# Patient Record
Sex: Female | Born: 1975 | Race: White | Hispanic: No | Marital: Married | Smoking: Never smoker
Health system: Southern US, Community
[De-identification: ages and names within clinical notes are randomized; demographics above are authoritative.]

## PROBLEM LIST (undated history)

## (undated) DIAGNOSIS — R001 Bradycardia, unspecified: Secondary | ICD-10-CM

## (undated) DIAGNOSIS — R5383 Other fatigue: Secondary | ICD-10-CM

## (undated) DIAGNOSIS — K602 Anal fissure, unspecified: Secondary | ICD-10-CM

## (undated) DIAGNOSIS — I4719 Other supraventricular tachycardia: Secondary | ICD-10-CM

## (undated) DIAGNOSIS — R0683 Snoring: Secondary | ICD-10-CM

## (undated) DIAGNOSIS — K648 Other hemorrhoids: Secondary | ICD-10-CM

## (undated) DIAGNOSIS — K5900 Constipation, unspecified: Secondary | ICD-10-CM

## (undated) DIAGNOSIS — E0500 Thyrotoxicosis with diffuse goiter without thyrotoxic crisis or storm: Secondary | ICD-10-CM

## (undated) DIAGNOSIS — K635 Polyp of colon: Secondary | ICD-10-CM

## (undated) DIAGNOSIS — R079 Chest pain, unspecified: Secondary | ICD-10-CM

## (undated) DIAGNOSIS — I8390 Asymptomatic varicose veins of unspecified lower extremity: Secondary | ICD-10-CM

## (undated) DIAGNOSIS — R0789 Other chest pain: Secondary | ICD-10-CM

## (undated) DIAGNOSIS — L409 Psoriasis, unspecified: Secondary | ICD-10-CM

## (undated) HISTORY — DX: Thyrotoxicosis with diffuse goiter without thyrotoxic crisis or storm: E05.00

## (undated) HISTORY — DX: Snoring: R06.83

## (undated) HISTORY — DX: Other fatigue: R53.83

## (undated) HISTORY — DX: Chest pain, unspecified: R07.9

## (undated) HISTORY — DX: Psoriasis, unspecified: L40.9

## (undated) HISTORY — PX: MOLE REMOVAL: SHX2046

## (undated) HISTORY — DX: Other chest pain: R07.89

## (undated) HISTORY — DX: Polyp of colon: K63.5

## (undated) HISTORY — PX: TUBAL LIGATION: SHX77

## (undated) HISTORY — DX: Bradycardia, unspecified: R00.1

## (undated) HISTORY — DX: Constipation, unspecified: K59.00

## (undated) HISTORY — DX: Anal fissure, unspecified: K60.2

## (undated) HISTORY — DX: Other supraventricular tachycardia: I47.19

## (undated) HISTORY — PX: WISDOM TOOTH EXTRACTION: SHX21

## (undated) HISTORY — DX: Other hemorrhoids: K64.8

## (undated) HISTORY — DX: Asymptomatic varicose veins of unspecified lower extremity: I83.90

---

## 2004-05-10 ENCOUNTER — Ambulatory Visit: Payer: Self-pay | Admitting: Internal Medicine

## 2004-05-12 ENCOUNTER — Ambulatory Visit: Payer: Self-pay | Admitting: Internal Medicine

## 2005-01-23 ENCOUNTER — Ambulatory Visit: Payer: Self-pay | Admitting: Internal Medicine

## 2005-03-26 ENCOUNTER — Other Ambulatory Visit: Admission: RE | Admit: 2005-03-26 | Discharge: 2005-03-26 | Payer: Self-pay | Admitting: Obstetrics and Gynecology

## 2006-03-22 ENCOUNTER — Ambulatory Visit: Payer: Self-pay | Admitting: Internal Medicine

## 2006-10-04 ENCOUNTER — Ambulatory Visit: Payer: Self-pay | Admitting: Internal Medicine

## 2007-01-01 DIAGNOSIS — L408 Other psoriasis: Secondary | ICD-10-CM

## 2007-05-02 ENCOUNTER — Inpatient Hospital Stay (HOSPITAL_COMMUNITY): Admission: AD | Admit: 2007-05-02 | Discharge: 2007-05-06 | Payer: Self-pay | Admitting: Obstetrics and Gynecology

## 2007-05-03 ENCOUNTER — Encounter (INDEPENDENT_AMBULATORY_CARE_PROVIDER_SITE_OTHER): Payer: Self-pay | Admitting: Obstetrics and Gynecology

## 2007-05-07 ENCOUNTER — Encounter (HOSPITAL_COMMUNITY): Admission: RE | Admit: 2007-05-07 | Discharge: 2007-05-14 | Payer: Self-pay | Admitting: Obstetrics and Gynecology

## 2007-07-23 ENCOUNTER — Ambulatory Visit: Payer: Self-pay | Admitting: Internal Medicine

## 2007-07-23 DIAGNOSIS — K602 Anal fissure, unspecified: Secondary | ICD-10-CM | POA: Insufficient documentation

## 2008-02-25 ENCOUNTER — Telehealth: Payer: Self-pay | Admitting: Internal Medicine

## 2009-05-21 LAB — CONVERTED CEMR LAB

## 2009-10-21 ENCOUNTER — Ambulatory Visit: Payer: Self-pay | Admitting: Internal Medicine

## 2009-10-24 LAB — CONVERTED CEMR LAB
Albumin: 4.7 g/dL (ref 3.5–5.2)
Basophils Absolute: 0 10*3/uL (ref 0.0–0.1)
Basophils Relative: 0.4 % (ref 0.0–3.0)
Cholesterol: 130 mg/dL (ref 0–200)
Creatinine, Ser: 0.6 mg/dL (ref 0.4–1.2)
Eosinophils Absolute: 0.1 10*3/uL (ref 0.0–0.7)
Eosinophils Relative: 1.1 % (ref 0.0–5.0)
GFR calc non Af Amer: 125.45 mL/min (ref >60)
HCT: 39.5 % (ref 36.0–46.0)
LDL Cholesterol: 60 mg/dL (ref 0–99)
Lymphs Abs: 2.1 10*3/uL (ref 0.7–4.0)
MCHC: 34.5 g/dL (ref 30.0–36.0)
MCV: 90.6 fL (ref 78.0–100.0)
Monocytes Absolute: 0.4 10*3/uL (ref 0.1–1.0)
Monocytes Relative: 7.2 % (ref 3.0–12.0)
Neutro Abs: 3.6 10*3/uL (ref 1.4–7.7)
Sodium: 142 meq/L (ref 135–145)
Total CHOL/HDL Ratio: 2
Total Protein: 7.3 g/dL (ref 6.0–8.3)
WBC: 6.2 10*3/uL (ref 4.5–10.5)

## 2009-10-25 ENCOUNTER — Encounter (INDEPENDENT_AMBULATORY_CARE_PROVIDER_SITE_OTHER): Payer: Self-pay | Admitting: *Deleted

## 2009-12-14 ENCOUNTER — Ambulatory Visit: Payer: Self-pay | Admitting: Gastroenterology

## 2009-12-15 ENCOUNTER — Telehealth: Payer: Self-pay | Admitting: Gastroenterology

## 2009-12-26 ENCOUNTER — Inpatient Hospital Stay (HOSPITAL_COMMUNITY): Admission: AD | Admit: 2009-12-26 | Discharge: 2009-12-27 | Payer: Self-pay | Admitting: Obstetrics and Gynecology

## 2009-12-26 ENCOUNTER — Ambulatory Visit: Payer: Self-pay | Admitting: Family

## 2010-03-08 ENCOUNTER — Inpatient Hospital Stay (HOSPITAL_COMMUNITY): Admission: AD | Admit: 2010-03-08 | Discharge: 2010-03-08 | Payer: Self-pay | Admitting: Obstetrics and Gynecology

## 2010-05-10 ENCOUNTER — Encounter
Admission: RE | Admit: 2010-05-10 | Discharge: 2010-05-21 | Payer: Self-pay | Source: Home / Self Care | Attending: Obstetrics and Gynecology | Admitting: Obstetrics and Gynecology

## 2010-06-16 ENCOUNTER — Inpatient Hospital Stay (HOSPITAL_COMMUNITY)
Admission: AD | Admit: 2010-06-16 | Discharge: 2010-06-19 | Payer: Self-pay | Source: Home / Self Care | Attending: Obstetrics and Gynecology | Admitting: Obstetrics and Gynecology

## 2010-06-16 LAB — CBC
HCT: 37.4 % (ref 36.0–46.0)
Hemoglobin: 13 g/dL (ref 12.0–15.0)
MCHC: 34.8 g/dL (ref 30.0–36.0)
MCV: 90.1 fL (ref 78.0–100.0)
RDW: 12.8 % (ref 11.5–15.5)

## 2010-06-16 LAB — GLUCOSE, CAPILLARY
Glucose-Capillary: 79 mg/dL (ref 70–99)
Glucose-Capillary: 121 mg/dL — ABNORMAL HIGH (ref 70–99)

## 2010-06-17 LAB — CBC
HCT: 32 % — ABNORMAL LOW (ref 36.0–46.0)
MCHC: 33.8 g/dL (ref 30.0–36.0)

## 2010-06-20 NOTE — Assessment & Plan Note (Signed)
Summary: rectal bleeding--ch.   History of Present Illness Visit Type: Initial Consult Primary GI MD: Elie Goody MD [facility] Primary Provider: Birdie Sons, MD Requesting Provider: Birdie Sons, MD Chief Complaint: Patient c/o occasional brbpr since her early 20's. She states that she does occasionally have rectal pain. She denies any problems with constipation/diarrhea. No abdominal pain. Of Note: Patient is [redacted] weeks pregnant. History of Present Illness:   This is a 35 year old white female, who relates a 10 year history of intermittent rectal bleeding and rectal pain. Dr. Cato Mulligan diagnosed an anal fissure in 2009, and she was temporarily treated with nitroglycerin, but noted side effects and she discontinued it. She has recently been using a corticosteroid cream as needed which only gives her temporary relief of symptoms.  She has mild constipation, but no other gastrointestinal complaints. She is [redacted] weeks pregnant with her second child.    GI Review of Systems      Denies abdominal pain, acid reflux, belching, bloating, chest pain, dysphagia with liquids, dysphagia with solids, heartburn, loss of appetite, nausea, vomiting, vomiting blood, weight loss, and  weight gain.      Reports constipation, rectal bleeding, and  rectal pain.     Denies anal fissure, black tarry stools, change in bowel habit, diarrhea, diverticulosis, fecal incontinence, heme positive stool, hemorrhoids, irritable bowel syndrome, jaundice, light color stool, and  liver problems. Preventive Screening-Counseling & Management  Alcohol-Tobacco     Smoking Status: never  Caffeine-Diet-Exercise     Does Patient Exercise: no      Drug Use:  no.     Current Medications (verified): 1)  Clobex Spray 0.05 % Liqd (Clobetasol Propionate) .... Two Times A Day To Scalp 2)  Bio-Statin  Powd (Nystatin) .... Prn 3)  Triamcinolone Acetonide 0.1 % Crea (Triamcinolone Acetonide) .... As Needed 4)  Desonide 0.05 % Oint  (Desonide) .... Prn 5)  Prenatal Vitamins 0.8 Mg Tabs (Prenatal Multivit-Min-Fe-Fa) .... Take 1 Tablet By Mouth Once Daily 6)  Colace (Unknown Dosage) .... Take 1 Tabelt By Mouth Once Daily 7)  Metamucil 0.52 Gm Caps (Psyllium) .... Take 2 Capsules By Mouth Once Daily  Allergies (verified): No Known Drug Allergies  Past History:  Past Medical History: psoriasis anal fissure  Past Surgical History: mole removals c-section x 1  Family History: mother-? 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  Alcohol Use - no Illicit Drug Use - no Patient does not get regular exercise.  Drug Use:  no Does Patient Exercise:  no  Review of Systems       The patient complains of pregnancy symptoms.  The patient denies allergy/sinus, anemia, anxiety-new, arthritis/joint pain, back pain, blood in urine, breast changes/lumps, change in vision, confusion, cough, coughing up blood, depression-new, fainting, fatigue, fever, headaches-new, hearing problems, heart murmur, heart rhythm changes, itching, menstrual pain, muscle pains/cramps, night sweats, nosebleeds, shortness of breath, skin rash, sleeping problems, sore throat, swelling of feet/legs, swollen lymph glands, thirst - excessive , urination - excessive , urination changes/pain, urine leakage, vision changes, and voice change.    Vital Signs:  Patient profile:   35 year old female Menstrual status:  regular Height:      65.5 inches Weight:      152.38 pounds BMI:     25.06 BSA:  1.77 Pulse rate:   56 / minute Pulse rhythm:   regular BP sitting:   100 / 58  (left arm)  Vitals Entered By: Lamona Curl CMA Duncan Dull) (December 14, 2009 3:53 PM)  Physical Exam  General:  Well developed,  well nourished, no acute distress. Head:  Normocephalic and atraumatic. Eyes:  PERRLA, no icterus. Ears:  Normal auditory acuity. Mouth:  No deformity or lesions, dentition normal. Lungs:  Clear throughout to auscultation. Heart:  Regular rate and rhythm; no murmurs, rubs,  or bruits. Abdomen:  Soft, nontender and nondistended. No masses, hepatosplenomegaly or hernias noted. Normal bowel sounds. Rectal:  Subtle linear erythema in the  posterior anal canal, possibly a healing fissure, which is mildly tender. Anal canal is nontender. Hemoccult-Negative yellow brown stool in the vault. No other abnormalities noted. Extremities:  No clubbing, cyanosis, edema or deformities noted. Neurologic:  Alert and  oriented x4;  grossly normal neurologically. Psych:  Alert and cooperative. Normal mood and affect.  Impression & Recommendations:  Problem # 1:  ANAL FISSURE (ICD-565.0) Suspected healing anal fissure. Begin diltiazem 2% cream t.i.d. for 8 weeks, if cleared by her obstetrician. Analpram-HC cream t.i.d. p.r.n., if cleared by her obstetrician. Ideally she should undergo a flexible sigmoidoscopy or colonoscopy for further evaluation, however, since she is pregnant will defer this exam until after her pregnancy. Continue stool softeners and fiber supplements, along with adequate daily for water intake.  Patient Instructions: 1)  We will contact you after we recieve confirmation from your OBGYN  that it is safe for [place] to prescribe Diltazem cream and Analpram for your rectal pain while pregnant.  2)  Analpram cream kit has been sent to your pharmacy.  3)  Please schedule a follow-up appointment in 2 months.  4)  Copy sent to : Birdie Sons, MD 5)                        Huel Cote, MD 6)  The medication list was reviewed and reconciled.  All changed / newly prescribed medications were explained.  A complete medication list was provided to the patient / caregiver.  Prescriptions: ANALPRAM  ADVANCED 2.5-1 % KIT (HC-PRAMOX-DIET MNG PROD -WIPE) use rectally three times a day as needed  #14 x 1   Entered by:   Christie Nottingham CMA (AAMA)   Authorized by:   Meryl Dare MD [facility]   Signed by:   Meryl Dare MD [facility] on 12/14/2009   Method used:   Electronically to        [facility] [address] (retail)       [phone]       [place], [place]  [address]       Ph: [phone]       Fax: [phone]   RxID:   [id number]

## 2010-06-20 NOTE — Progress Notes (Signed)
Summary: Diltiazem cream clearance  Phone Note Outgoing Call Call back at Dr. Huel Cote   Call placed by: Christie Nottingham CMA Duncan Dull),  December 15, 2009 8:53 AM Call placed to: [facility] Summary of Call: Left a message at Dr. Berenda Morale office for Tamara [name] to return my call regarding pt's clearance for [place] to prescribe Diltiazem 2% cream for rectal pain b/c pt is [redacted] weeks pregnant.  Initial call taken by: Christie Nottingham CMA Duncan Dull),  December 15, 2009 8:56 AM  Follow-up for Phone Call        Kriste Basque returned my call and states she will have to ask Dr. Senaida Ores and will call me back.  Follow-up by: Christie Nottingham CMA Duncan Dull),  December 15, 2009 9:00 AM  Additional Follow-up for Phone Call Additional follow up Details #1::        Tamara [name] called back and states per Dr. Senaida Ores that pt can use Diltiazem cream. Pt notified and Rx was sent to pts pharmacy.  Additional Follow-up by: Christie Nottingham CMA Duncan Dull),  December 16, 2009 9:24 AM    New/Updated Medications: * DILTIAZEM 2% CREAM use rectally three times a day x 8 weeks Prescriptions: DILTIAZEM 2% CREAM use rectally three times a day x 8 weeks  #1 tube x 1   Entered by:   Christie Nottingham CMA (AAMA)   Authorized by:   Meryl Dare MD [facility]   Signed by:   Christie Nottingham CMA (AAMA) on 12/16/2009   Method used:   Faxed to ...       [facility]* (retail)       [address]       [place], [place]  [id number]       Ph: [phone]       Fax: [phone]   RxID:   [id number] [facility] ADVANCED 2.5-1 % KIT (HC-PRAMOX-DIET MNG PROD -WIPE) use rectally three times a day as needed  #14 x 1   Entered by:   Christie Nottingham CMA (AAMA)   Authorized by:   Meryl Dare MD [facility]   Signed by:   Christie Nottingham CMA (AAMA) on 12/16/2009   Method used:   Electronically to        [facility]* (retail)       [address]       [place], [place]  [id number]       Ph: [phone]       Fax: [phone]   RxID:    [id number]

## 2010-06-20 NOTE — Assessment & Plan Note (Signed)
Summary: cpx/no pap/pt will come in fasting/njr/pt rescd//ccm/pt rescd...   Vital Signs:  Patient profile:   35 year old female Menstrual status:  regular LMP:     10/11/2009 Weight:      146 pounds Temp:     98.1 degrees F oral BP sitting:   110 / 80  (left arm)  Vitals Entered By: Kathrynn Speed CMA (October 21, 2009 10:05 AM) CC: CPX /Rectum bleeding when has a bowl movement LMP (date): 10/11/2009     Menstrual Status regular Enter LMP: 10/11/2009 Last PAP Result normalpt's report   CC:  CPX /Rectum bleeding when has a bowl movement.  History of Present Illness: Feels well has had recurrent BRBPR--nervous about colon CA  All other systems reviewed and were negative   Current Problems (verified): 1)  Rectal Fissure  (ICD-565.0) 2)  Psoriasis  (ICD-696.1)  Current Medications (verified): 1)  Clobex 0.05 %  Lotn (Clobetasol Propionate) .... Once Daily For Psoriasis 2)  Bio-Statin  Powd (Nystatin) .... Prn 3)  Triamcinolone Acetonide 0.1 % Crea (Triamcinolone Acetonide) .... As Needed 4)  Desonide 0.05 % Oint (Desonide) .... Prn  Allergies (verified): No Known Drug Allergies  Past History:  Past Medical History: Last updated: 01/01/2007 psoriasis  Past Surgical History: Last updated: 01/01/2007 mole removals  Family History: Last updated: 10/21/2009 mother-? 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   Eyes:  No  corneal or conjunctival inflammation noted. EOMI. Perrla. Funduscopic exam benign, without hemorrhages, exudates or papilledema. Vision grossly normal. Ears:  R ear normal and L ear normal.   Neck:  No deformities, masses, or tenderness noted. Chest Wall:  no deformities and no tenderness.   Lungs:  normal respiratory effort and no intercostal retractions.   Heart:  normal rate and regular rhythm.   Abdomen:  Bowel sounds positive,abdomen soft and non-tender without masses, organomegaly or hernias noted. Msk:  No deformity or scoliosis noted of thoracic or lumbar spine.   Pulses:  R radial normal and L radial normal.   Neurologic:  cranial nerves II-XII intact and gait normal.     Impression & Recommendations:  Problem # 1:  Preventive Health Care (ICD-V70.0)  health maint UTD  Orders: UA Dipstick w/o Micro (automated)  (81003) Venipuncture ([address]) TLB-Lipid Panel (80061-LIPID) TLB-BMP (Basic Metabolic Panel-BMET) (80048-METABOL) TLB-CBC Platelet - w/Differential (85025-CBCD) TLB-Hepatic/Liver Function Pnl (80076-HEPATIC) TLB-TSH (Thyroid Stimulating Hormone) (84443-TSH)  Complete Medication List: 1)  Clobex Spray 0.05 % Liqd (Clobetasol propionate) .... Two times a day to scalp 2)  Bio-statin Powd (Nystatin) .... Prn 3)  Triamcinolone Acetonide 0.1 % Crea (Triamcinolone acetonide) .... As needed 4)  Desonide 0.05 % Oint (Desonide) .... Prn  Other Orders: Gastroenterology Referral (GI)  Preventive Care Screening  Pap Smear:    Date:  05/21/2009    Next Due:  05/2012    Results:  normalpt's report   Prescriptions: TRIAMCINOLONE ACETONIDE 0.1 % CREA (TRIAMCINOLONE ACETONIDE) as needed  #30 grams x PRN   Entered and Authorized by:   Birdie Sons MD   Signed by:   Birdie Sons MD on 10/21/2009  Method used:   Electronically to        [facility].* (retail)       [phone]       [place], [place]  [address]       Ph:  [phone]       Fax: [phone]   RxID:   [id number] CLOBEX SPRAY 0.05 % LIQD (CLOBETASOL PROPIONATE) two times a day to scalp  #1bottle x PRN   Entered and Authorized by:   Birdie Sons MD   Signed by:   Birdie Sons MD on 10/21/2009   Method used:   Electronically to        [facility] [address] (retail)       [phone]       [place], [place]  [address]       Ph: [phone]       Fax: [phone]   RxID:   [id number]   Appended Document: cpx/no pap/pt will come in fasting/njr/pt rescd//ccm/pt rescd...  Laboratory Results   Urine Tests    Routine Urinalysis   Color: yellow Appearance: Clear Glucose: negative   (Normal Range: Negative) Bilirubin: negative   (Normal Range: Negative) Ketone: negative   (Normal Range: Negative) Spec. Gravity: 1.025   (Normal Range: 1.003-1.035) Blood: negative   (Normal Range: Negative) pH: 6.5   (Normal Range: 5.0-8.0) Protein: negative   (Normal Range: Negative) Urobilinogen: 0.2   (Normal Range: 0-1) Nitrite: negative   (Normal Range: Negative) Leukocyte Esterace: negative   (Normal Range: Negative)    Comments: Rita Ohara  October 21, 2009 11:23 AM

## 2010-06-20 NOTE — Letter (Signed)
Summary: New Patient letter  [facility]  [address] [place], [place] [address]   Phone: [phone]  Fax: [phone]       10/25/2009 MRN: [id number]  [facility] [address] [place], [place]  [address]  Dear Ms. Tamara [name],  Welcome to the Gastroenterology Division at [facility].    You are scheduled to see Dr.  Russella Dar on 11-30-09 at 11:00a.m. on the 3rd floor at [facility], [address]  We ask that you try to arrive at our office 15 minutes prior to your appointment time to allow for check-in.  We would like you to complete the enclosed self-administered evaluation form prior to your visit and bring it with you on the day of your appointment.  We will review it with you.  Also, please bring a complete list of all your medications or, if you prefer, bring the medication bottles and we will list them.  Please bring your insurance card so that we may make a copy of it.  If your insurance requires a referral to see a specialist, please bring your referral form from your primary care physician.  Co-payments are due at the time of your visit and may be paid by cash, check or credit card.     Your office visit will consist of a consult with your physician (includes a physical exam), any laboratory testing he/she may order, scheduling of any necessary diagnostic testing (e.g. x-ray, ultrasound, CT-scan), and scheduling of a procedure (e.g. Endoscopy, Colonoscopy) if required.  Please allow enough time on your schedule to allow for any/all of these possibilities.    If you cannot keep your appointment, please call [phone] to cancel or reschedule prior to your appointment date.  This allows [place] the opportunity to schedule an appointment for another patient in need of care.  If you do not cancel or reschedule by 5 p.m. the business day prior to your appointment date, you will be charged a $50.00 late cancellation/no-show fee.    Thank you for choosing  [facility] for your medical needs.  We appreciate the opportunity to care for you.  Please visit [place] at our website  to learn more about our practice.                     Sincerely,                                                             The Gastroenterology Division

## 2010-06-21 ENCOUNTER — Ambulatory Visit (HOSPITAL_COMMUNITY): Payer: BC Managed Care – PPO | Attending: Obstetrics and Gynecology

## 2010-06-21 NOTE — Discharge Summary (Signed)
Tamara [name], Tamara [name]              ACCOUNT NO.:  [id number]  MEDICAL RECORD NO.:  [id number]          PATIENT TYPE:  INP  LOCATION:  9147                          FACILITY:  [facility]  PHYSICIAN:  Huel Cote, M.D. DATE OF BIRTH:  1976-01-20  DATE OF ADMISSION:  06/16/2010 DATE OF DISCHARGE:  06/19/2010                              DISCHARGE SUMMARY   DISCHARGE DIAGNOSES: 1. Preterm pregnancy at 35-2/7 weeks delivered. 2. Gestational diabetes. 3. Prior cesarean section, declined vaginal birth after cesarean. 4. Desired sterility 5. Spontaneous rupture of membranes. 6. Status post repeat low transverse cesarean section and a bilateral     tubal ligation.  DISCHARGE MEDICATIONS: 1. Motrin 600 mg p.o. every 6 hours. 2. Percocet 1-2 tablets p.o. every 4 hours p.r.n.  DISCHARGE FOLLOWUP:  The patient is to follow up in the office later this week on February 2 or June 23, 2010, for staple removal.  HOSPITAL COURSE:  The patient is a 35 year old G2, P 1-0-0-1 who was admitted at 35-2/7 weeks after she presented to maternity admission with a rupture of membranes of clear fluid.  She had no contractions on admission.  Prenatal care was complicated by gestational diabetes which was diet controlled until the week of her admission when she had glyburide added at night time for some higher fasting blood sugars.  She had first trimester bleeding from a subchorionic hemorrhage which resolved and also had a prior C-section for CPD in 2008 of an 8-pound 1- ounce infant and declined a VBAC trial of labor.  At her last exam in the office she was also felt to be breech presentation.  She requested tubal ligation for permanent sterility as well with her procedure.  PRENATAL LABORATORY FINDINGS:  A positive, antibody negative, rubella immune, hepatitis B surface antigen negative, HIV negative, RPR negative, GC negative, Chlamydia negative, first trimester screen declined, 1-hour Glucola 148,  3-hour Glucola 106, 184, 160, 98.  PAST OBSTETRICAL HISTORY:  In 2008, she had a C-section for an 8-pound 1- ounce infant for failure to progress.  PAST GYNECOLOGIC HISTORY:  None.  PAST SURGICAL HISTORY:  The C-section only.  PAST MEDICAL HISTORY:  Psoriasis.  ALLERGIES:  None.  MEDICATIONS ON ADMISSION: 1. Prenatal vitamin. 2. Glyburide 2.5 mg p.o. nightly.  SOCIAL HISTORY:  She is married, use no tobacco, alcohol or drugs. Worked as a Doctor, general practice.  On admission, she was afebrile with stable vital signs.  Blood sugar was in good at 79.  Fetal heart rate was reactive.  She was counseled again regarding her options and declined any trial of labor preferring a repeat C-section.  She also requested a tubal ligation for permanent sterility and was quoted the risk of pregnancy after the surgery of 1 in 100 as well as an increased risk of ectopic should pregnancy occur.  She desired to proceed with both.  She underwent a low transverse C-section and delivered of a viable female infant from breech presentation.Apgars were 9 and 9, weight was 5 pounds 15 ounces and she was then admitted for routine postop care.  Postop day #1, she was doing well. Hemoglobin was 10.8 down  from 13.  By postop day #3; she was ambulating well, tolerating regular diet, having no difficulty voiding and was afebrile with stable vital signs.  Her incision was clear with her staples intact and she was discharged home with plan to come in the office and have those removed later this week.     Huel Cote, M.D.     KR/MEDQ  D:  06/19/2010  T:  06/19/2010  Job:  [id number]  Electronically Signed by Huel Cote M.D. on 06/21/2010 29:56:21 AM

## 2010-06-21 NOTE — Op Note (Signed)
Tamara [name], Tamara [name]              ACCOUNT NO.:  [id number]  MEDICAL RECORD NO.:  [id number]          PATIENT TYPE:  INP  LOCATION:  9147                          [facility]:  [facility]  PHYSICIAN:  Huel Cote, M.D. DATE OF BIRTH:  01-02-1976  DATE OF PROCEDURE:  06/16/2010 DATE OF DISCHARGE:                              OPERATIVE REPORT   PREOPERATIVE DIAGNOSES: 1. Preterm pregnancy at 35 and 2/7 weeks. 2. Gestational diabetes. 3. Prior cesarean section declines vaginal birth after cesarean. 4. Desires sterility.  POSTOPERATIVE DIAGNOSES: 1. Preterm pregnancy at 35 and 2/7 weeks. 2. Gestational diabetes. 3. Prior cesarean section declines vaginal birth after cesarean. 4. Desires sterility. 5. Breech presentation noted.  PROCEDURE:  Repeat low transverse cesarean section with two-layer closure of uterus and bilateral tubal ligation.  SURGEON:  Huel Cote, MD  ANESTHESIA:  Spinal.  FINDINGS:  There is a normal uterus, tubes, and ovaries noted.  There is a viable female infant in a breech presentation, Apgars were 9 and 9, weight 5 pounds 15 ounces.  SPECIMEN:  Placenta was sent to L and D.  ESTIMATED BLOOD LOSS:  650 mL.  URINE OUTPUT:  100 mL clear urine.  IV FLUIDS:  3000 mL of LR.  COMPLICATIONS:  There were no known complications.  DESCRIPTION OF PROCEDURE:  The patient was taken to the operating room where spinal anesthesia was obtained without difficulty.  She was then prepped and draped in the normal sterile fashion in the dorsal supine position with a leftward tilt.  After an Allis clamp test was performed, a Foley catheter placed.  Pfannenstiel skin incision was then made through a preexisting scar with the scalpel and carried through to underlying layer of fascia by sharp dissection and Bovie cautery.  The fascia was then nicked in the midline and the incision was extended laterally with Mayo scissors.  The inferior aspect of the incision was then  grasped with Kocher clamps, elevated and dissected off the underlying rectus muscles.  In a similar fashion, the superior aspect was dissected off the rectus muscles.  These were then separated in midline with a hemostat and the peritoneal cavity entered bluntly.  The peritoneal incision was then extended both superiorly and inferiorly with careful attention to avoid both bowel and bladder.  Once this was completed, the Alexis self-retaining wound retractor was placed within the incision and good visualization of the lower uterine segment obtained.  The lower uterine segment then had a bladder flap created and the lower uterine segment then incised in a transverse fashion.  The cavity itself was entered bluntly.  The amniotic sac was then ruptured with clear fluid noted and breech presentation noted.  The infant was then delivered carefully producing the legs and arms and delivering the head in a flexed position.  Once this was completed, nose and mouth bulb suctioned and the infant was handed off to the awaiting pediatriciansafter the cord was cut.  The placenta was then expressed spontaneously and handed off as specimen.  Uterus cleared of all clots and debris with moist lap sponge.  There was some mild uterine atony which responded very well to  bimanual massage and Pitocin.  The incision was then closed in two layers, the first a running locked layer 1-0 chromic and the second an imbricating layer of the same suture.  Once this was completed, the incision appeared hemostatic.  Attention was then turned to the patient's left fallopian tube which was elevated with Babcock clamp into a 2-3 cm buckle.  This was then opened at the mesosalpinx with the Bovie cautery and a free tie of 2-0 plain passed through it. This 2-3 cm segment was then tied off with two free ties of 2-0 plain and the segment then amputated and handed off as specimen.  Free pedicle was then additionally cauterized with  Bovie cautery and a similar fashion on the right tube was elevated in a Babcock clamp and the mesosalpinx opened and two free ties of 2-0 plain passed around the tubal segment.  It was then also amputated and handed off for pathology. The pedicle on this side was also additionally Bovie cautery.  Once this was completed, both pedicles were inspected and found to be hemostatic and were returned to the abdomen.  The incision itself was once again inspected and found to be hemostatic.  Therefore, all instruments and sutures were removed from the abdomen.  The Alexis retractor was removed.  The rectus muscles and peritoneum were then reapproximated with several interrupted mattress sutures of 2-0 Vicryl.  The subfascial planes were inspected and any oozing controlled with Bovie cautery.  The fascia was then closed with 0 Vicryl in a running fashion and the skin was closed with staples.  Again sponge, lap, and needle counts were correct x2 and the patient was taken to the recovery room in good condition.     Huel Cote, M.D.     KR/MEDQ  D:  06/16/2010  T:  06/16/2010  Job:  [id number]  Electronically Signed by Huel Cote M.D. on 06/21/2010 81:19:14 AM

## 2010-06-21 NOTE — Discharge Summary (Signed)
  Tamara [name], Tamara [name]              ACCOUNT NO.:  [id number]  MEDICAL RECORD NO.:  [id number]          PATIENT TYPE:  INP  LOCATION:  9147                          [facility]  PHYSICIAN:  Huel Cote, M.D. DATE OF BIRTH:  1975/10/27  DATE OF ADMISSION:  06/16/2010 DATE OF DISCHARGE:  06/19/2010                              DISCHARGE SUMMARY   ADMISSION DIAGNOSES: 1. Preterm pregnancy at 35-2/7th weeks, delivered. 2. Gestational diabetes. 3. Prior cesarean section declined vaginal birth after cesarean     (section) 4. Desires sterility. 5. Spontaneous rupture of membranes.  DISCHARGE DIAGNOSES: 1. Preterm pregnancy at 35-2/7th weeks, delivered. 2. Gestational diabetes. 3. Prior cesarean section declined vaginal birth after cesarean     (section) 4. Desires sterility. 5. Spontaneous rupture of membranes. 6. Breech presentation.  PROCEDURES: 1. Repeat low transverse C-section with two-layer closure of uterus. 2. Bilateral tubal ligation.  SURGEON:  Huel Cote, MD   Dictation ended at this point.     Huel Cote, M.D.     KR/MEDQ  D:  06/19/2010  T:  06/19/2010  Job:  [id number]  Electronically Signed by Huel Cote M.D. on 06/21/2010 04:54:09 AM

## 2010-07-14 ENCOUNTER — Inpatient Hospital Stay (HOSPITAL_COMMUNITY)
Admission: RE | Admit: 2010-07-14 | Payer: BC Managed Care – PPO | Source: Ambulatory Visit | Admitting: Obstetrics and Gynecology

## 2010-07-26 ENCOUNTER — Ambulatory Visit: Payer: BC Managed Care – PPO | Attending: Gynecologic Oncology | Admitting: Gynecologic Oncology

## 2010-07-26 DIAGNOSIS — N909 Noninflammatory disorder of vulva and perineum, unspecified: Secondary | ICD-10-CM | POA: Insufficient documentation

## 2010-07-26 DIAGNOSIS — Z803 Family history of malignant neoplasm of breast: Secondary | ICD-10-CM | POA: Insufficient documentation

## 2010-07-26 DIAGNOSIS — O2210 Genital varices in pregnancy, unspecified trimester: Secondary | ICD-10-CM | POA: Insufficient documentation

## 2010-07-26 DIAGNOSIS — O9089 Other complications of the puerperium, not elsewhere classified: Secondary | ICD-10-CM | POA: Insufficient documentation

## 2010-07-28 ENCOUNTER — Encounter: Payer: Self-pay | Admitting: Gastroenterology

## 2010-07-28 ENCOUNTER — Ambulatory Visit (INDEPENDENT_AMBULATORY_CARE_PROVIDER_SITE_OTHER): Payer: BC Managed Care – PPO | Admitting: Gastroenterology

## 2010-07-28 ENCOUNTER — Encounter (INDEPENDENT_AMBULATORY_CARE_PROVIDER_SITE_OTHER): Payer: Self-pay | Admitting: *Deleted

## 2010-07-28 DIAGNOSIS — K921 Melena: Secondary | ICD-10-CM | POA: Insufficient documentation

## 2010-08-01 NOTE — Letter (Signed)
Summary: [facility] Instructions  [facility]  [address] [place], [place] [address]   Phone: [phone]  Fax: [phone]       Tamara [name]    11-28-1975    MRN: [id number]        Procedure Day /Date:08/16/10 WED     Arrival Time:3 pm     Procedure Time:4 pm     Location of Procedure:                    X  [facility] (4th Floor)                        PREPARATION FOR COLONOSCOPY WITH MOVIPREP   Starting 5 days prior to your procedure 08/11/10 do not eat nuts, seeds, popcorn, corn, beans, peas,  salads, or any raw vegetables.  Do not take any fiber supplements (e.g. Metamucil, Citrucel, and Benefiber).  THE DAY BEFORE YOUR PROCEDURE         DATE: 08/15/10  DAY: TUE  1.  Drink clear liquids the entire day-NO SOLID FOOD  2.  Do not drink anything colored red or purple.  Avoid juices with pulp.  No orange juice.  3.  Drink at least 64 oz. (8 glasses) of fluid/clear liquids during the day to prevent dehydration and help the prep work efficiently.  CLEAR LIQUIDS INCLUDE: Water Jello Ice Popsicles Tea (sugar ok, no milk/cream) Powdered fruit flavored drinks Coffee (sugar ok, no milk/cream) Gatorade Juice: apple, white grape, white cranberry  Lemonade Clear bullion, consomm, broth Carbonated beverages (any kind) Strained chicken noodle soup Hard Candy                             4.  In the morning, mix first dose of MoviPrep solution:    Empty 1 Pouch A and 1 Pouch B into the disposable container    Add lukewarm drinking water to the top line of the container. Mix to dissolve    Refrigerate (mixed solution should be used within 24 hrs)  5.  Begin drinking the prep at 5:00 p.m. The MoviPrep container is divided by 4 marks.   Every 15 minutes drink the solution down to the next mark (approximately 8 oz) until the full liter is complete.   6.  Follow completed prep with 16 oz of clear liquid of your choice (Nothing red or purple).   Continue to drink clear liquids until bedtime.  7.  Before going to bed, mix second dose of MoviPrep solution:    Empty 1 Pouch A and 1 Pouch B into the disposable container    Add lukewarm drinking water to the top line of the container. Mix to dissolve    Refrigerate  THE DAY OF YOUR PROCEDURE      DATE: 08/16/10 DAY: WED  Beginning at11 a.m. (5 hours before procedure):         1. Every 15 minutes, drink the solution down to the next mark (approx 8 oz) until the full liter is complete.  2. Follow completed prep with 16 oz. of clear liquid of your choice.    3. You may drink clear liquids until 2 pm  (2 HOURS BEFORE PROCEDURE).   MEDICATION INSTRUCTIONS  Unless otherwise instructed, you should take regular prescription medications with a small sip of water   as early as possible the morning of your procedure.  OTHER INSTRUCTIONS  You will need a responsible adult at least 33 years of age to accompany you and drive you home.   This person must remain in the waiting room during your procedure.  Wear loose fitting clothing that is easily removed.  Leave jewelry and other valuables at home.  However, you may wish to bring a book to read or  an iPod/MP3 player to listen to music as you wait for your procedure to start.  Remove all body piercing jewelry and leave at home.  Total time from sign-in until discharge is approximately 2-3 hours.  You should go home directly after your procedure and rest.  You can resume normal activities the  day after your procedure.  The day of your procedure you should not:   Drive   Make legal decisions   Operate machinery   Drink alcohol   Return to work  You will receive specific instructions about eating, activities and medications before you leave.    The above instructions have been reviewed and explained to me by   _______________________    I fully understand and can verbalize these instructions  _____________________________ Date _________

## 2010-08-01 NOTE — Assessment & Plan Note (Signed)
Summary: blood in stool   History of Present Illness Visit Type: Follow-up Visit Primary GI MD: Elie Goody MD [facility] Primary Provider: Birdie Sons, MD Requesting Provider: Birdie Sons, MD Chief Complaint: pain in abdomen when having BMS with some blood in stools.  Pt. wants to have colonoscopy. History of Present Illness:    This is a 35 year old female with a long history of intermittent small volume hematochezia.  She states she has had infrequent episodes of small volume hematochezia with bowel movement since I last saw her in July. Please refer to my office note from July 2011. She is now 6 weeks post partum with a healthy baby and would like to proceed with colonoscopy for further evaluation.   GI Review of Systems    Reports abdominal pain.      Denies acid reflux, belching, bloating, chest pain, dysphagia with liquids, dysphagia with solids, heartburn, loss of appetite, nausea, vomiting, vomiting blood, weight loss, and  weight gain.      Reports rectal bleeding.     Denies anal fissure, black tarry stools, change in bowel habit, constipation, diarrhea, diverticulosis, fecal incontinence, heme positive stool, hemorrhoids, irritable bowel syndrome, jaundice, light color stool, liver problems, and  rectal pain.   Current Medications (verified): 1)  Clobex Spray 0.05 % Liqd (Clobetasol Propionate) .... Two Times A Day To Scalp 2)  Triamcinolone Acetonide 0.1 % Crea (Triamcinolone Acetonide) .... As Needed 3)  Desonide 0.05 % Oint (Desonide) .... Prn 4)  Prenatal Vitamins 0.8 Mg Tabs (Prenatal Multivit-Min-Fe-Fa) .... Take 1 Tablet By Mouth Once Daily 5)  Colace (Unknown Dosage) .... Take 1 Tabelt By Mouth Once Daily 6)  Metamucil 0.52 Gm Caps (Psyllium) .... Take 2 Capsules By Mouth Once Daily  Allergies (verified): No Known Drug Allergies  Past History:  Past Medical History: Reviewed history from 07/21/2010 and no changes required. psoriasis anal fissure Gestational  diabetes  Past Surgical History: Reviewed history from 07/21/2010 and no changes required. mole removals c-section x 1 Bilateral Tubal Ligation  Family History: Reviewed history from 12/14/2009 and no changes required. mother-? hx cervical ca or precancerous lesions father---alcoholic No FH of Colon Cancer: Family History of Liver Disease/Cirrhosis: Grandfather (alcoholism) Family History of Heart Disease: Maternal Grandfather   Social History: Reviewed history from 12/14/2009 and no changes required. 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  Alcohol Use - no Illicit Drug Use - no Patient does not get regular exercise.   Review of Systems       The patient complains of fatigue, itching, and skin rash.         The pertinent positives and negatives are noted as above and in the HPI. All other ROS were reviewed and were negative.   Vital Signs:  Patient profile:   35 year old female Menstrual status:  regular Height:      65.5 inches Weight:      159 pounds BMI:     26.15 Pulse rate:   60 / minute Pulse rhythm:   regular BP sitting:   108 / 62  (left arm)  Vitals Entered By: Milford Cage NCMA (July 28, 2010 10:40 AM)  Physical Exam  General:  Well developed, well nourished, no acute distress. Head:  Normocephalic and atraumatic. Eyes:  PERRLA, no icterus. Ears:  Normal auditory acuity. Mouth:  No deformity or lesions, dentition normal. Lungs:  Clear throughout to auscultation. Heart:  Regular rate and rhythm; no murmurs, rubs,  or bruits.  Abdomen:  Soft, nontender and nondistended. No masses, hepatosplenomegaly or hernias noted. Normal bowel sounds. Rectal:  deferred until time of colonoscopy.   Pulses:  Normal pulses noted. Extremities:  No clubbing, cyanosis, edema or deformities noted. Neurologic:  Alert and  oriented x4;  grossly normal neurologically. Psych:  Alert and cooperative. Normal mood and affect.  Impression &  Recommendations:  Problem # 1:  BLOOD IN STOOL (ICD-578.1)  Intermittent hematochezia. Rule out hemorrhoids fissures and other disorders. The risks, benefits and alternatives to colonoscopy with possible biopsy, possible destruction of hemorrhoids and possible polypectomy were discussed with the patient and they consent to proceed. The procedure will be scheduled electively. Orders: Colonoscopy (Colon)  Patient Instructions: 1)  You have been scheduled for a Colonoscopy. 2)  Instructions have been provided. 3)  Colonoscopy brochure given.  4)  Your prep has been sent to your pharmacy. 5)  The medication list was reviewed and reconciled.  All changed / newly prescribed medications were explained.  A complete medication list was provided to the patient / caregiver.  Prescriptions: MOVIPREP 100 GM  SOLR (PEG-KCL-NACL-NASULF-NA ASC-C) As per prep instructions.  #1 x 0   Entered by:   Chales Abrahams CMA (AAMA)   Authorized by:   Meryl Dare MD [facility]   Signed by:   Chales Abrahams CMA Duncan Dull) on 07/28/2010   Method used:   Electronically to        [facility] [address] (retail)       [id number]       [place], [place]  [address]       Ph: [phone]       Fax: [phone]   RxID:   [id number]

## 2010-08-04 LAB — CBC
HCT: 35.5 % — ABNORMAL LOW (ref 36.0–46.0)
MCHC: 34.5 g/dL (ref 30.0–36.0)
Platelets: 326 10*3/uL (ref 150–400)
RDW: 13.1 % (ref 11.5–15.5)

## 2010-08-04 LAB — URINALYSIS, ROUTINE W REFLEX MICROSCOPIC
Glucose, UA: NEGATIVE mg/dL
Leukocytes, UA: NEGATIVE
Urobilinogen, UA: 0.2 mg/dL (ref 0.0–1.0)
pH: 6 (ref 5.0–8.0)

## 2010-08-10 ENCOUNTER — Encounter: Payer: Self-pay | Admitting: *Deleted

## 2010-08-11 ENCOUNTER — Ambulatory Visit (AMBULATORY_SURGERY_CENTER): Payer: BC Managed Care – PPO | Admitting: Gastroenterology

## 2010-08-11 ENCOUNTER — Encounter: Payer: Self-pay | Admitting: Gastroenterology

## 2010-08-11 VITALS — BP 122/70 | HR 52 | Temp 98.0°F | Resp 16 | Ht 65.0 in | Wt 157.0 lb

## 2010-08-11 DIAGNOSIS — K921 Melena: Secondary | ICD-10-CM

## 2010-08-11 DIAGNOSIS — K625 Hemorrhage of anus and rectum: Secondary | ICD-10-CM

## 2010-08-11 DIAGNOSIS — D126 Benign neoplasm of colon, unspecified: Secondary | ICD-10-CM

## 2010-08-11 NOTE — Patient Instructions (Addendum)
PUMP AND DISCARD BREAST MILK FOR 24 HOURS   Discharge instructions reviewed  Polyp and hemorrhoid education reviewed  Await pathology results via letter in approximately 2 weeks

## 2010-08-11 NOTE — Consult Note (Signed)
NAMENICLOE, FRONTERA              ACCOUNT NO.:  [id number]  MEDICAL RECORD NO.:  [id number]           PATIENT TYPE:  LOCATION:                                 FACILITY:  PHYSICIAN:  Isaish Alemu A. Duard Brady, MD    DATE OF BIRTH:  01/15/1976  DATE OF CONSULTATION:  07/26/2010 DATE OF DISCHARGE:                                CONSULTATION   The patient is seen today in consultation request of Dr. Senaida Ores. Ms. Driscoll is a very pleasant 35 year old gravida 2, para 2 who recently had her baby status post repeat low-transverse cesarean section with a tubal ligation done at the same time on June 16, 2010.  She was seen by Dr. Senaida Ores for postpartum visit on July 18, 2010. At that time, it was noted in the right labia she had a deep mobile mass felt at the superior margin that measured about 1.5 to 2 cm and a deeper mass that was under it that was less discrete that tract close to the inguinal area, that did move over the bone.  There was no adenopathy. It is for this reason she is referred to [place] today.  She did have her exam at that time and her Pap smear was negative.  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  Now that the varicosities have gotten better, these lesions are more prominent to her.  She states that she has had aching on that right side for many years and it might be that she had fairly significant varicosities with her son, Cheree Ditto when she was pregnant with him on that right side.  There is really no significant pain at this time.  She continues to have some bleeding from her delivery.  She is nursing.  Her daughter, Westly Pam is 34-1/2 weeks old and is doing very well.  PAST MEDICAL HISTORY:  Psoriasis.  MEDICATIONS:  Ibuprofen p.r.n., prenatal vitamins, and Colace.  ALLERGIES:  None.  PAST SURGICAL HISTORY:  Cesarean section x2, tubal ligation with  this most recent one, wisdom tooth extraction, and a mole removed from her back that was benign.  SOCIAL HISTORY:  She is married.  She is a Doctor, general practice.  She denies use of tobacco or alcohol.  FAMILY HISTORY:  Significant for her maternal grandmother had breast cancer at the age of 28.  Maternal grandfather died of a stroke, he had coronary artery disease.  Her parents both have alcoholism.  Her paternal grandfather has alcoholism.  She has a half-sister and half- brother who are related on her dad's side and they are alive and well.  PHYSICAL EXAMINATION:  VITAL SIGNS:  Height 5 feet 5 inches, weight 162 pounds, blood pressure 108/70, pulse 64, respirations 16, temperature 97.6. GENERAL:  Well-nourished, well-developed female in no acute distress. NECK:  Supple.  There is no lymphadenopathy, no thyromegaly. LUNGS:  Clear to auscultation bilaterally. CARDIOVASCULAR:  Regular rate and rhythm. ABDOMEN:  Normal.  There is no palpable masses.  Abdomen is low protuberant inferiorly consistent with  postpartum state.  Groins are negative for adenopathy.  She has a well-healed transverse skin incision. EXTREMITIES:  There is no edema. PELVIC:  External genitalia is atraumatic with no visible lesions noted. She does have some varicosities on the right, much greater than the left.  On deep palpation on the right vulva along the mons, there is a chain of small nodules that are mobile and nontender.  They are deep to the skin and appear deep to the varicosities.  It is similar to what pearls on a chain would feel like.  They measure approximately 1 to 1.5 cm each.  There are two of them and they are quite discrete and nontender.  ASSESSMENT:  A 35 year old with a lesion in the vulva.  These lesions appear most likely benign with the differential diagnosis being somewhat difficult to ascertain, it could be hidradenitis without any significant inflammation and supportive changes versus  changes related to her varicosities.  I discussed with her that we could proceed with MRI imaging versus ultrasound of the vulva.  I do not believe this represents a malignancy.  This could be somehow related to her postpartum state though I cannot necessarily describe what it would be. At this point, she would like to follow it and return to see me in about 4-6 weeks to see if the lesions get smaller, get larger and then that would subsequently determine our course of action.  She knows to contact me if the lesions enlarge prior to our next scheduled visit, but I have encouraged her to resist continuous palpation as if this is somewhat inflammatory in nature it could exacerbate it.     Tedric Leeth A. Duard Brady, MD     PAG/MEDQ  D:  07/26/2010  T:  07/27/2010  Job:  [id number]  MW:[id number] Senaida Ores, MD   Telford Nab, R.N. [address] [place], [place] [address]  Electronically Signed by Cleda Mccreedy MD on 07/27/2010 12:10:48 PM

## 2010-08-14 ENCOUNTER — Telehealth: Payer: Self-pay | Admitting: *Deleted

## 2010-08-14 NOTE — Telephone Encounter (Signed)
No ID on answering machine. 

## 2010-08-15 ENCOUNTER — Encounter: Payer: Self-pay | Admitting: Gastroenterology

## 2010-08-16 ENCOUNTER — Other Ambulatory Visit: Payer: BC Managed Care – PPO | Admitting: Gastroenterology

## 2010-08-17 NOTE — Procedures (Signed)
Summary: Colonoscopy  Patient: [name] Note: All result statuses are Final unless otherwise noted.  Tests: (1) Colonoscopy (COL)   COL Colonoscopy           DONE     [facility]     [address]     [place], [place]  [address]          COLONOSCOPY PROCEDURE REPORT          PATIENT:  Tamara [name], Tamara [name]  MR#:  [id number]     BIRTHDATE:  1976-03-25, 34 yrs. old  GENDER:  female     ENDOSCOPIST:  Judie Petit T. Russella Dar, MD, [facility]     Referred by:  Birdie Sons, M.D.     PROCEDURE DATE:  08/11/2010     PROCEDURE:  Colonoscopy with biopsy     ASA CLASS:  Class I     INDICATIONS:  1) hematochezia     MEDICATIONS:   Fentanyl 100 mcg IV, Versed 10 mg IV     DESCRIPTION OF PROCEDURE:   After the risks benefits and     alternatives of the procedure were thoroughly explained, informed     consent was obtained.  Digital rectal exam was performed and     revealed no abnormalities.   The LB [id number]-[id number] endoscope     was introduced through the anus and advanced to the cecum, which     was identified by both the appendix and ileocecal valve, without     limitations.  The quality of the prep was excellent, using     MoviPrep.  The instrument was then slowly withdrawn as the colon     was fully examined.     <<PROCEDUREIMAGES>>     FINDINGS:  Two polyps were found in the sigmoid colon. They were 4     mm in size. The polyps were removed using cold biopsy forceps.  A     normal appearing cecum, ileocecal valve, and appendiceal orifice     were identified. The ascending, hepatic flexure, transverse,     splenic flexure, descending colon, and rectum appeared     unremarkable. Retroflexed views in the rectum revealed internal     hemorrhoids, small. The time to cecum =  2.5  minutes. The scope     was then withdrawn (time =  11.25  min) from the patient and the     procedure completed.          COMPLICATIONS:  None          ENDOSCOPIC IMPRESSION:     1) 4 mm, two polyps in the  sigmoid colon     2) small internal hemorrhoids          RECOMMENDATIONS:     1) Await pathology results     2) If the polyps are adenomatous, colonoscopy in 5 years.     Otherwise follow colorectal cancer screening guidelines for     "routine risk" patients with colonoscopy at age 14.          Venita Lick. Russella Dar, MD, [facility]          n.     eSIGNED:   Venita Lick. Nour Rodrigues at 08/11/2010 11:37 AM          Marcie Bal, [id number]  Note: An exclamation mark (!) indicates a result that was not dispersed into the flowsheet. Document Creation Date: 08/11/2010 11:37 AM _______________________________________________________________________  (1) Order result status: Final Collection or observation date-time: 08/11/2010  11:29 Requested date-time:  Receipt date-time:  Reported date-time:  Referring Physician:   Ordering Physician: Claudette Head [phone]) Specimen Source:  Source: Launa Grill Order Number: [id number] Lab site:

## 2010-09-06 ENCOUNTER — Ambulatory Visit: Payer: BC Managed Care – PPO | Admitting: Gynecologic Oncology

## 2010-10-03 NOTE — H&P (Signed)
Tamara [name], Tamara [name]              ACCOUNT NO.:  [id number]   MEDICAL RECORD NO.:  [id number]          PATIENT TYPE:  INP   LOCATION:  9167                          FACILITY:  [facility]   PHYSICIAN:  Malachi Pro. Ambrose Mantle, M.D. DATE OF BIRTH:  [birth year]   DATE OF ADMISSION:  05/02/2007  DATE OF DISCHARGE:                              HISTORY & PHYSICAL   HISTORY:  This is a 35 year old white married female, para zero, gravida  1, EDC May 03, 2007, admitted in early labor.  Blood group and type  was A positive, negative antibody, nonreactive serology, rubella immune,  hepatitis B surface antigen negative, HIV negative, GC and chlamydia  negative, 1 hour Glucola 123, group B strep positive, declined first and  second trimester screens.  Vaginal ultrasound on Sep 26, 2006, crown rump  length 2.1 cm, 8 weeks 5 days, [facility] May 03, 2007.  Ultrasound on  December 04, 2006, showed normal anatomy compatible with dates.  Placenta  previa was noted but ultrasound on February 17, 2007, showed resolution  of the placenta previa.  Prenatal care was uncomplicated.  The patient  began contracting on the day of admission and came to the maternity  admission unit.  The nurse in the MAU called her 4 cm, 80% and the  patient was admitted.   PAST MEDICAL HISTORY:  No known allergies.  Operations, precancerous  moles removed.  Illnesses, psoriasis.   FAMILY HISTORY:  Maternal grandmother with high blood pressure and  thyroid problems.  Maternal grandfather with CVA.   ALCOHOL, TOBACCO AND DRUGS:  None.   PHYSICAL EXAMINATION:  VITAL SIGNS:  Vital signs were normal.  Pulse was  56, respirations 16, blood pressure 125/60.  She was afebrile.  HEART:  Heart normal size and sounds.  No murmurs.  LUNGS:  Lungs clear to auscultation.  ABDOMEN:  Soft.  Fundal height 35 cm on April 07, 2007.  Fetal heart  tones normal.  Cervix by my exam was 3 cm, 80%, vertex at -2 at 1:50  a.m. on May 03, 2007.   Artificial rupture of the membranes produced clear fluid.  The patient  was begun on penicillin.  Because her contractions did not stay  completely regular after an epidural she was started on Pitocin and at  7:29 a.m. she was fully dilated by the RN's exam and began pushing with  the vertex at a zero to +1 station.  At 8:35 a.m. she had pushed for 1  hour, the vertex was unchanged in descent, fetal heart was normal.  Pitocin was at 6 milliunits a minute and was increased to 8 milliunits a  minute.  At 9:15 a.m. she had pushed for 1 hour and 45 minutes.  There  had been no significant descent.  Vertex was at a zero to +1 station.  It was LOT.  At 9:45 a.m. there was possibly slight descent.  No caput  was visible and at 10:30 a.m. the patient had pushed for 3 hours and  wanted to quit pushing and wanted a C-section.  The vertex was at a +1  station but  the leading part of the caput was 1 inch above the introitus  with pushing.  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   ADMITTING IMPRESSION:  Intrauterine pregnancy at 40 weeks, relative  cephalopelvic disproportion, persistent occiput posterior.  The patient  is prepared for C-section but the operating room crew is engaged with a  C-section already so I have requested that a second team be called.      Malachi Pro. Ambrose Mantle, M.D.  Electronically Signed     TFH/MEDQ  D:  05/03/2007  T:  05/03/2007  Job:  [id number]

## 2010-10-03 NOTE — Op Note (Signed)
[name], [name]              ACCOUNT NO.:  [id number]   MEDICAL RECORD NO.:  [id number]          PATIENT TYPE:  INP   LOCATION:  9134                          FACILITY:  [facility]   PHYSICIAN:  Tamara [name], M.D. DATE OF BIRTH:  07-13-1975   DATE OF PROCEDURE:  05/03/2007  DATE OF DISCHARGE:                               OPERATIVE REPORT   PREOPERATIVE DIAGNOSIS:  Intrauterine pregnancy, 40 weeks, relative  cephalopelvic disproportion, persistent occiput posterior.   POSTOPERATIVE DIAGNOSES:  1. Intrauterine pregnancy, 40 weeks, relative cephalopelvic      disproportion, persistent occiput posterior.  2. Small fibroid, no more than 1 cm.   OPERATION:  Low transverse cervical cesarean section.   OPERATOR:  Tamara [name], M.D.   ANESTHESIA:  Epidural anesthesia.   DESCRIPTION OF PROCEDURE:  The patient was brought to the operating  room.  Anesthesia was boosted through the epidural catheter.  The  abdomen was prepped with Betadine solution.  Fetal heart tones were  normal.  The area was draped as a sterile field.  Anesthesia was  confirmed with an Allis clamp.  A transverse incision was made and  carried in layers through the skin, subcutaneous tissue and fascia.  The  fascia was then separated from the rectus muscles superiorly and  inferiorly.  The rectus muscle was split in the midline, peritoneum  opened vertically.  The lower uterine segment was exposed.  An incision  was made into the lower uterine segment with the knife through the  superficial layer of the myometrium.  I then entered the amniotic sac  with my finger and obtained clear fluid.  I enlarged the incision to the  left with the bandage scissors and minimally to the right.  The baby was  LOP.  The baby was not down terribly far in the pelvis.  I easily was  able to take my hand and reached down below the baby's head, rotate it  around OA and deliver it through the incisional opening.  The nose and  mouth  was suctioned with the bulb.  The remainder of the baby was  delivered.  Cord was clamped.  The infant was given to Dr. Radene Ou, who  was in attendance.  She assigned the female infant, who weighed 8 pounds 1  ounce, Apgars of 9 and 10 at 1 and 5 minutes.  The cord and placenta  were removed, the inside of the uterus inspected and found to be free of  any products of conception.  The uterus, in spite of the fact that it  was fairly small, was somewhat boggy.  We used IV Pitocin and vigorous  massage to try to get some more uterine tone.  I then identified all  angles of the uterus, closed the uterine incision in 2 layers using a  running-lock suture of 0 Vicryl on the first layer, nonlocking suture of  the same material on the second layer.  Both tubes and ovaries appeared  normal.  There was one small fibroid no more than a centimeter on the  anterior uterine surface.  Liberal irrigation confirmed hemostasis.  The  gutters were blotted free of blood and the abdominal wall was then  closed in layers using interrupted sutures of 0 Vicryl on the rectus  muscle including the peritoneum, 2 running sutures of 0 Vicryl on the  fascia, a running 3-0 Vicryl on the subcu tissue and staples on the  skin.  The patient seemed to tolerate the procedure well.  Blood loss  was estimated at 1000  mL.  At the end of the procedure, she still had about 100 mL of liquid  blood that were expressed from the uterine cavity.  The uterus was again  quite small, but still a little bit boggy, so we will continue the  Pitocin, massage and hold off Hemabate and Methergine at the present  time.  The patient returned to recovery in satisfactory condition.      Tamara [name], M.D.  Electronically Signed     TFH/MEDQ  D:  05/03/2007  T:  05/05/2007  Job:  [id number]

## 2010-10-06 NOTE — Discharge Summary (Signed)
Tamara [name], Tamara [name]              ACCOUNT NO.:  [id number]   MEDICAL RECORD NO.:  [id number]          PATIENT TYPE:  INP   LOCATION:  9134                          FACILITY:  [facility]   PHYSICIAN:  Malachi Pro. Ambrose Mantle, M.D. DATE OF BIRTH:  1975-06-24   DATE OF ADMISSION:  05/02/2007  DATE OF DISCHARGE:  05/06/2007                               DISCHARGE SUMMARY   This is a 35 year old white married female para 0 gravida 1, admitted in  early labor.  Her history and physical are dictated in the admission  history and physical exam.  The patient underwent a C-section because  she had CPD.  She pushed for three hours and the vertex remained at a +1  station.  Delivery was of an 8 pound 1 ounce female infant without Apgars  of 9 at 1 and 10 at 5 minutes.  The baby remained persistently OP.  There was a small fibroid noted on the uterus.  Postoperatively, she did  well and was discharged on the third postop day.  Initial hemoglobin  13.9, hematocrit 40, white count 14,600, platelet count 345,000.  Follow-  up hemoglobin 10.2, hematocrit 29.4.  RPR was nonreactive.  On the day  of discharge, the patient's staples were removed.  Strips were applied.  She was given a prescription for Percocet 5/325, 36 tablets, one every 4-  6 hours as needed for pain.  She was to return to the office in 10-14  days for follow-up examination.   FINAL DIAGNOSES:  1. Intrauterine pregnancy at term.  2. Cephalopelvic disproportion.  3. Small fibroid.   OPERATION:  Low transverse cervical C-section.   FINAL CONDITION:  Improved.   INSTRUCTIONS:  Include our regular discharge instruction booklet.   The patient will return in 10-14 days for follow-up examination.      Malachi Pro. Ambrose Mantle, M.D.  Electronically Signed     TFH/MEDQ  D:  05/26/2007  T:  05/27/2007  Job:  [id number]

## 2011-02-26 LAB — CBC
HCT: 40
MCHC: 34.7
MCHC: 34.8
MCV: 93.4
MCV: 94
Platelets: 246
RBC: 3.13 — ABNORMAL LOW
RDW: 12.9
WBC: 13.3 — ABNORMAL HIGH

## 2011-02-26 LAB — CCBB MATERNAL DONOR DRAW

## 2011-06-24 ENCOUNTER — Encounter: Payer: Self-pay | Admitting: Family Medicine

## 2011-06-24 ENCOUNTER — Ambulatory Visit (INDEPENDENT_AMBULATORY_CARE_PROVIDER_SITE_OTHER): Payer: BC Managed Care – PPO | Admitting: Family Medicine

## 2011-06-24 VITALS — BP 107/63 | HR 68 | Temp 98.4°F | Resp 16 | Ht 64.25 in | Wt 162.8 lb

## 2011-06-24 DIAGNOSIS — B9689 Other specified bacterial agents as the cause of diseases classified elsewhere: Secondary | ICD-10-CM

## 2011-06-24 DIAGNOSIS — H1033 Unspecified acute conjunctivitis, bilateral: Secondary | ICD-10-CM

## 2011-06-24 DIAGNOSIS — J019 Acute sinusitis, unspecified: Secondary | ICD-10-CM

## 2011-06-24 DIAGNOSIS — H1030 Unspecified acute conjunctivitis, unspecified eye: Secondary | ICD-10-CM

## 2011-06-24 MED ORDER — AMOXICILLIN 875 MG PO TABS: 875.0000 mg | ORAL_TABLET | Freq: Two times a day (BID) | ORAL | Status: AC

## 2011-06-24 MED ORDER — TOBRAMYCIN 0.3 % OP SOLN: 1.0000 [drp] | OPHTHALMIC | Status: AC

## 2011-06-24 NOTE — Progress Notes (Signed)
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  Uses contacts.  Both eyes involved  Obj:  HEENT neg except for purulent discharge from nose Eyes injected, normal fundi  Assess:  Sinusitis and conjunctivitis  Plan:  tobrex eye gtts, Amoxicillin

## 2011-06-24 NOTE — Patient Instructions (Signed)
Sinusitis Sinuses are air pockets within the bones of your face. The growth of bacteria within a sinus leads to infection. The infection prevents the sinuses from draining. This infection is called sinusitis. SYMPTOMS  There will be different areas of pain depending on which sinuses have become infected.  The maxillary sinuses often produce pain beneath the eyes.   Frontal sinusitis may cause pain in the middle of the forehead and above the eyes.  Other problems (symptoms) include:  Toothaches.   Colored, pus-like (purulent) drainage from the nose.   Swelling, warmth, and tenderness over the sinus areas may be signs of infection.  TREATMENT  Sinusitis is most often determined by an exam.X-rays may be taken. If x-rays have been taken, make sure you obtain your results or find out how you are to obtain them. Your caregiver may give you medications (antibiotics). These are medications that will help kill the bacteria causing the infection. You may also be given a medication (decongestant) that helps to reduce sinus swelling.  HOME CARE INSTRUCTIONS   Only take over-the-counter or prescription medicines for pain, discomfort, or fever as directed by your caregiver.   Drink extra fluids. Fluids help thin the mucus so your sinuses can drain more easily.   Applying either moist heat or ice packs to the sinus areas may help relieve discomfort.   Use saline nasal sprays to help moisten your sinuses. The sprays can be found at your local drugstore.  SEEK IMMEDIATE MEDICAL CARE IF:  You have a fever.   You have increasing pain, severe headaches, or toothache.   You have nausea, vomiting, or drowsiness.   You develop unusual swelling around the face or trouble seeing.  MAKE SURE YOU:   Understand these instructions.   Will watch your condition.   Will get help right away if you are not doing well or get worse.  Document Released: 05/07/2005 Document Revised: 01/17/2011 Document Reviewed:  12/04/2006 ExitCare Patient Information 2012 [employer].Conjunctivitis Conjunctivitis is commonly called "pink eye." Conjunctivitis can be caused by bacterial or viral infection, allergies, or injuries. There is usually redness of the lining of the eye, itching, discomfort, and sometimes discharge. There may be deposits of matter along the eyelids. A viral infection usually causes a watery discharge, while a bacterial infection causes a yellowish, thick discharge. Pink eye is very contagious and spreads by direct contact. You may be given antibiotic eyedrops as part of your treatment. Before using your eye medicine, remove all drainage from the eye by washing gently with warm water and cotton balls. Continue to use the medication until you have awakened 2 mornings in a row without discharge from the eye. Do not rub your eye. This increases the irritation and helps spread infection. Use separate towels from other household members. Wash your hands with soap and water before and after touching your eyes. Use cold compresses to reduce pain and sunglasses to relieve irritation from light. Do not wear contact lenses or wear eye makeup until the infection is gone. SEEK MEDICAL CARE IF:   Your symptoms are not better after 3 days of treatment.   You have increased pain or trouble seeing.   The outer eyelids become very red or swollen.  Document Released: 06/14/2004 Document Revised: 01/17/2011 Document Reviewed: 05/07/2005 Exit[employer] Patient Information 2012 [employer]. 

## 2011-09-27 ENCOUNTER — Other Ambulatory Visit (INDEPENDENT_AMBULATORY_CARE_PROVIDER_SITE_OTHER): Payer: BC Managed Care – PPO

## 2011-09-27 DIAGNOSIS — B839 Helminthiasis, unspecified: Secondary | ICD-10-CM

## 2011-09-27 DIAGNOSIS — Z0000 Encounter for general adult medical examination without abnormal findings: Secondary | ICD-10-CM

## 2011-09-27 LAB — POCT URINALYSIS DIPSTICK
Ketones, UA: NEGATIVE
Protein, UA: NEGATIVE
pH, UA: 7.5

## 2011-09-27 LAB — BASIC METABOLIC PANEL
BUN: 10 mg/dL (ref 6–23)
Chloride: 106 mEq/L (ref 96–112)
Creatinine, Ser: 0.6 mg/dL (ref 0.4–1.2)
GFR: 121.59 mL/min (ref >60.00)
Glucose, Bld: 100 mg/dL — ABNORMAL HIGH (ref 70–99)
Potassium: 4.3 mEq/L (ref 3.5–5.1)
Sodium: 139 mEq/L (ref 135–145)

## 2011-09-27 LAB — CBC WITH DIFFERENTIAL/PLATELET
Eosinophils Relative: 1 % (ref 0.0–5.0)
Lymphocytes Relative: 25.8 % (ref 12.0–46.0)
Lymphs Abs: 1.6 10*3/uL (ref 0.7–4.0)
Monocytes Absolute: 0.4 10*3/uL (ref 0.1–1.0)
Monocytes Relative: 6.8 % (ref 3.0–12.0)
Platelets: 315 10*3/uL (ref 150.0–400.0)
RBC: 4.2 Mil/uL (ref 3.87–5.11)
WBC: 6.4 10*3/uL (ref 4.5–10.5)

## 2011-09-27 LAB — MICROALBUMIN / CREATININE URINE RATIO
Creatinine,U: 140.4 mg/dL
Microalb, Ur: 0.5 mg/dL (ref 0.0–1.9)

## 2011-09-27 LAB — LIPID PANEL
LDL Cholesterol: 43 mg/dL (ref 0–99)
Total CHOL/HDL Ratio: 2
VLDL: 12.6 mg/dL (ref 0.0–40.0)

## 2011-09-27 LAB — HEPATIC FUNCTION PANEL: Total Bilirubin: 0.9 mg/dL (ref 0.3–1.2)

## 2011-10-03 ENCOUNTER — Ambulatory Visit (INDEPENDENT_AMBULATORY_CARE_PROVIDER_SITE_OTHER): Payer: BC Managed Care – PPO | Admitting: Internal Medicine

## 2011-10-03 ENCOUNTER — Encounter: Payer: Self-pay | Admitting: Internal Medicine

## 2011-10-03 VITALS — BP 120/78 | HR 68 | Temp 98.2°F | Ht 65.0 in | Wt 162.0 lb

## 2011-10-03 DIAGNOSIS — Z23 Encounter for immunization: Secondary | ICD-10-CM

## 2011-10-03 DIAGNOSIS — Z0000 Encounter for general adult medical examination without abnormal findings: Secondary | ICD-10-CM

## 2011-10-03 NOTE — Progress Notes (Signed)
Patient ID: Tamara [name], female   DOB: 1976-04-03, 36 y.o.   MRN: [id number] cpx  Past Medical History  Diagnosis Date  . Psoriasis   . Anal fissure   . Diabetes mellitus     History   Social History  . Marital Status: Married    Spouse Name: N/A    Number of Children: N/A  . Years of Education: N/A   Occupational History  . Not on file.   Social History Main Topics  . Smoking status: Never Smoker   . Smokeless tobacco: Not on file  . Alcohol Use: No  . Drug Use: No  . Sexually Active:    Other Topics Concern  . Not on file   Social History Narrative  . No narrative on file    Past Surgical History  Procedure Date  . Mole removal   . Tubal ligation   . Wisdom tooth extraction   . Cesarean section     2 c sections    Family History  Problem Relation Age of Onset  . Heart disease Maternal Grandfather   . Alcohol abuse Mother   . Alcohol abuse Father     No Known Allergies  Current Outpatient Prescriptions on File Prior to Visit  Medication Sig Dispense Refill  . Clobetasol Propionate (CLOBEX SPRAY) 0.05 % external spray Apply topically 2 (two) times daily.        Marland Kitchen desonide (DESOWEN) 0.05 % cream Apply topically 2 (two) times daily.        Marland Kitchen docusate sodium (COLACE) 100 MG capsule Take 100 mg by mouth daily.        . psyllium (REGULOID) 0.52 G capsule Take 0.52 g by mouth daily.           patient denies chest pain, shortness of breath, orthopnea. Denies lower extremity edema, abdominal pain, change in appetite, change in bowel movements. Patient denies rashes, musculoskeletal complaints. No other specific complaints in a complete review of systems.   BP 120/78  Pulse 68  Temp(Src) 98.2 F (36.8 C) (Oral)  Ht 5\' 5"  (1.651 m)  Wt 162 lb (73.483 kg)  BMI 26.96 kg/m2  LMP 09/28/2011  Well-developed well-nourished female in no acute distress. HEENT exam atraumatic, normocephalic, extraocular muscles are intact. Neck is supple. No jugular venous  distention no thyromegaly. Chest clear to auscultation without increased work of breathing. Cardiac exam S1 and S2 are regular. Abdominal exam active bowel sounds, soft, nontender. Extremities no edema. Neurologic exam she is alert without any motor sensory deficits. Gait is normal.  Well Visit: health maint UTD. Discussed a few MSK complaints-- highly doubt related to psoriasis-- no eval at this time Fatigue--- suspect related to full time work, married, 2 young kids and no exercise.

## 2011-10-09 ENCOUNTER — Other Ambulatory Visit: Payer: Self-pay | Admitting: Dermatology

## 2011-10-12 ENCOUNTER — Telehealth: Payer: Self-pay | Admitting: Internal Medicine

## 2011-10-12 DIAGNOSIS — Z0000 Encounter for general adult medical examination without abnormal findings: Secondary | ICD-10-CM

## 2011-10-12 DIAGNOSIS — I8390 Asymptomatic varicose veins of unspecified lower extremity: Secondary | ICD-10-CM

## 2011-10-12 NOTE — Telephone Encounter (Signed)
Patient called stating that at her last visit she was supposed to be referred to someone to check the veins in her legs as she was having tightness in her calves. Please advise.

## 2011-10-16 NOTE — Telephone Encounter (Signed)
I don't see anything documented on a referral

## 2011-10-17 NOTE — Telephone Encounter (Signed)
Addended by: Alfred Levins D on: 10/17/2011 10:11 AM   Modules accepted: Orders

## 2011-10-17 NOTE — Telephone Encounter (Signed)
Ok to refer to vascular surgeon for varicose veins

## 2011-10-17 NOTE — Telephone Encounter (Signed)
Referral order placed.

## 2011-11-13 ENCOUNTER — Encounter: Payer: BC Managed Care – PPO | Admitting: Vascular Surgery

## 2011-11-13 ENCOUNTER — Other Ambulatory Visit: Payer: BC Managed Care – PPO

## 2011-11-18 ENCOUNTER — Ambulatory Visit (INDEPENDENT_AMBULATORY_CARE_PROVIDER_SITE_OTHER): Payer: BC Managed Care – PPO | Admitting: Emergency Medicine

## 2011-11-18 VITALS — BP 112/75 | HR 52 | Temp 98.1°F | Resp 16 | Ht 64.75 in | Wt 160.2 lb

## 2011-11-18 DIAGNOSIS — J018 Other acute sinusitis: Secondary | ICD-10-CM

## 2011-11-18 DIAGNOSIS — H1030 Unspecified acute conjunctivitis, unspecified eye: Secondary | ICD-10-CM

## 2011-11-18 DIAGNOSIS — J40 Bronchitis, not specified as acute or chronic: Secondary | ICD-10-CM

## 2011-11-18 MED ORDER — PSEUDOEPHEDRINE-GUAIFENESIN ER 60-600 MG PO TB12: 1.0000 | ORAL_TABLET | Freq: Two times a day (BID) | ORAL | Status: DC

## 2011-11-18 MED ORDER — AMOXICILLIN-POT CLAVULANATE 875-125 MG PO TABS: 1.0000 | ORAL_TABLET | Freq: Two times a day (BID) | ORAL | Status: AC

## 2011-11-18 NOTE — Progress Notes (Signed)
Subjective:    Patient ID: Tamara [name], female    DOB: 04-14-1976, 36 y.o.   MRN: [id number]  Sore Throat  This is a new problem. The current episode started in the past 7 days. The problem has been unchanged. Neither side of throat is experiencing more pain than the other. The maximum temperature recorded prior to her arrival was 100 - 100.9 F. The pain is at a severity of 3/10. The pain is mild. Associated symptoms include congestion and coughing. Pertinent negatives include no abdominal pain, diarrhea, drooling, ear discharge, ear pain, headaches, hoarse voice, plugged ear sensation, neck pain, shortness of breath, stridor, swollen glands, trouble swallowing or vomiting. She has had exposure to strep. She has tried acetaminophen and NSAIDs for the symptoms. The treatment provided no relief.  Sinusitis This is a new problem. The current episode started in the past 7 days. The problem is unchanged. The maximum temperature recorded prior to her arrival was 100 - 100.9 F. Her pain is at a severity of 3/10. The pain is mild. Associated symptoms include congestion, coughing, sinus pressure and a sore throat. Pertinent negatives include no chills, diaphoresis, ear pain, headaches, hoarse voice, neck pain, shortness of breath, sneezing or swollen glands. Past treatments include acetaminophen and oral decongestants.  Cough This is a new problem. The current episode started in the past 7 days. The problem has been unchanged. The problem occurs constantly. The cough is non-productive. Associated symptoms include eye redness, a fever, nasal congestion, postnasal drip and a sore throat. Pertinent negatives include no chest pain, chills, ear congestion, ear pain, headaches, heartburn, hemoptysis, myalgias, rash, rhinorrhea, shortness of breath, sweats, weight loss or wheezing. Nothing aggravates the symptoms. There is no history of asthma, bronchiectasis, bronchitis, COPD, emphysema, environmental allergies or  pneumonia.  Conjunctivitis  The current episode started today. The onset was sudden. The problem occurs continuously. The problem has been unchanged. The problem is mild. Nothing relieves the symptoms. Associated symptoms include a fever, congestion, sore throat, cough, eye discharge and eye redness. Pertinent negatives include no decreased vision, no double vision, no eye itching, no photophobia, no abdominal pain, no diarrhea, no vomiting, no ear discharge, no ear pain, no headaches, no hearing loss, no mouth sores, no rhinorrhea, no stridor, no swollen glands, no neck pain, no wheezing, no rash and no eye pain. The eye pain is not associated with movement.      Review of Systems  Constitutional: Positive for fever. Negative for chills, weight loss and diaphoresis.  HENT: Positive for congestion, sore throat, postnasal drip and sinus pressure. Negative for hearing loss, ear pain, hoarse voice, rhinorrhea, sneezing, drooling, mouth sores, trouble swallowing, neck pain and ear discharge.   Eyes: Positive for discharge and redness. Negative for double vision, photophobia, pain and itching.  Respiratory: Positive for cough. Negative for hemoptysis, shortness of breath, wheezing and stridor.   Cardiovascular: Negative for chest pain.  Gastrointestinal: Negative for heartburn, vomiting, abdominal pain and diarrhea.  Genitourinary: Negative.   Musculoskeletal: Negative.  Negative for myalgias.  Skin: Negative for rash.  Neurological: Negative for headaches.  Hematological: Negative for environmental allergies.       Objective:   Physical Exam  Constitutional: She is oriented to person, place, and time. She appears well-developed and well-nourished.  HENT:  Head: Normocephalic and atraumatic.  Mouth/Throat: Oropharyngeal exudate present.  Eyes: Pupils are equal, round, and reactive to light. Left eye exhibits discharge.  Neck: Normal range of motion. Neck supple.  Cardiovascular: Normal rate  and normal heart sounds.   Pulmonary/Chest: Effort normal and breath sounds normal. She has no wheezes. She has no rales.  Abdominal: Soft.  Musculoskeletal: Normal range of motion.  Neurological: She is alert and oriented to person, place, and time.  Skin: Skin is warm and dry.          Assessment & Plan:  Sinusitis, bronchitis,  Conjunctivitis Meds Follow up as needed

## 2011-12-18 ENCOUNTER — Encounter: Payer: Self-pay | Admitting: Vascular Surgery

## 2011-12-19 ENCOUNTER — Ambulatory Visit (INDEPENDENT_AMBULATORY_CARE_PROVIDER_SITE_OTHER): Payer: BC Managed Care – PPO | Admitting: Vascular Surgery

## 2011-12-19 ENCOUNTER — Other Ambulatory Visit (INDEPENDENT_AMBULATORY_CARE_PROVIDER_SITE_OTHER): Payer: BC Managed Care – PPO | Admitting: *Deleted

## 2011-12-19 ENCOUNTER — Encounter: Payer: Self-pay | Admitting: Vascular Surgery

## 2011-12-19 VITALS — BP 111/53 | HR 55 | Resp 16 | Ht 65.0 in | Wt 162.0 lb

## 2011-12-19 DIAGNOSIS — I83893 Varicose veins of bilateral lower extremities with other complications: Secondary | ICD-10-CM

## 2011-12-19 NOTE — Assessment & Plan Note (Signed)
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 I've discussed with her the importance of intermittent leg elevation and the proper positioning for this. In addition I have given her a prescription for a thigh high compression stockings with a gradient of 20-30 mm of mercury. All have her follow up in 3 months to see how her symptoms are. If she is continuing to have symptoms given that her duplex study shows incompetence of the greater saphenous veins she did potentially be considered for laser ablation of the saphenous veins. We will set her up to see Dr. Hart Rochester or Dr. Arbie Cookey in 3 months. He knows to call sooner if she has problems.

## 2011-12-19 NOTE — Progress Notes (Signed)
[facility] [place]  Patient name: Tamara [name] MRN: [id number] DOB: [birth year] Sex: female  REASON FOR CONSULT: bilateral lower extremity varicose veins  HPI: Tamara [name] is a 36 y.o. female who has a long history of varicose veins of both lower extremities. Really began when she was a teenager. She describes aching and throbbing pain when she is standing or sitting for prolonged period of time. Her symptoms are relieved with elevation. She also describes her legs being tired. She's had no bleeding episodes. She's had no history of DVT or phlebitis that she is aware of.  Early other concern is that she was concerned that her blood pressure was low today (BP was111/53, and heart rate was 55). She states that as a child she was told that she had a slow heart rate. She also describes some occasional chest pain which improved when she presses on her chest and also some occasional left arm pain. She has not had any significant shortness of breath or pleuritic chest pain.  Past Medical History  Diagnosis Date  . Psoriasis   . Anal fissure     Family History  Problem Relation Age of Onset  . Heart disease Maternal Grandfather   . Alcohol abuse Mother   . Alcohol abuse Father   . Other Father     varicose veins    SOCIAL HISTORY: History  Substance Use Topics  . Smoking status: Never Smoker   . Smokeless tobacco: Never Used  . Alcohol Use: No    No Known Allergies  Current Outpatient Prescriptions  Medication Sig Dispense Refill  . Clobetasol Propionate (CLOBEX SPRAY) 0.05 % external spray Apply topically 2 (two) times daily.        Marland Kitchen desonide (DESOWEN) 0.05 % cream Apply topically 2 (two) times daily.        Marland Kitchen docusate sodium (COLACE) 100 MG capsule Take 100 mg by mouth as needed.       . psyllium (REGULOID) 0.52 G capsule Take 0.52 g by mouth as needed.       . pseudoephedrine-guaifenesin (MUCINEX D) 60-600 MG per tablet Take 1 tablet by mouth every 12  (twelve) hours.  18 tablet  0    REVIEW OF SYSTEMS: Arly.Keller ] denotes positive finding; [  ] denotes negative finding  CARDIOVASCULAR:  Arly.Keller ] chest pain   [ ]  chest pressure   [ ]  palpitations   [ ]  orthopnea   [ ]  dyspnea on exertion   [ ]  claudication   [ ]  rest pain   [ ]  DVT   [ ]  phlebitis PULMONARY:   [ ]  productive cough   [ ]  asthma   [ ]  wheezing NEUROLOGIC:   [ ]  weakness  [ ]  paresthesias  [ ]  aphasia  [ ]  amaurosis  [ ]  dizziness HEMATOLOGIC:   [ ]  bleeding problems   [ ]  clotting disorders MUSCULOSKELETAL:  [ ]  joint pain   [ ]  joint swelling [ ]  leg swelling GASTROINTESTINAL: Arly.Keller ]  blood in stool  [ ]   hematemesis GENITOURINARY:  [ ]   dysuria  [ ]   hematuria PSYCHIATRIC:  [ ]  history of major depression INTEGUMENTARY:  [ ]  rashes  [ ]  ulcers CONSTITUTIONAL:  [ ]  fever   [ ]  chills  PHYSICAL EXAM: Filed Vitals:   12/19/11 1015  BP: 111/53  Pulse: 55  Resp: 16  Height: 5\' 5"  (1.651 m)  Weight: 162 lb (73.483 kg)  SpO2: 100%  Body mass index is 26.96 kg/(m^2). GENERAL: The patient is a well-nourished female, in no acute distress. The vital signs are documented above. CARDIOVASCULAR: There is a regular rate and rhythm without significant murmur appreciated. I do not detect carotid bruits. She has palpable femoral popliteal and pedal pulses. She has mild bilateral lower extremity swelling. PULMONARY: There is good air exchange bilaterally without wheezing or rales. ABDOMEN: Soft and non-tender with normal pitched bowel sounds.  MUSCULOSKELETAL: There are no major deformities or cyanosis. NEUROLOGIC: No focal weakness or paresthesias are detected. SKIN: she does have reticular vein along the lateral aspect of both thighs. She also has Telangiectasias and small varicose veins in both eyes and calves. PSYCHIATRIC: The patient has a normal affect.  DATA:  Lab Results  Component Value Date   WBC 6.4 09/27/2011   HGB 12.8 09/27/2011   HCT 38.5 09/27/2011   MCV 91.8 09/27/2011   PLT  315.0 09/27/2011   I've independently interpreted her venous duplex scan. On the left side she has incompetence of the greater saphenous vein and also deep venous reflux in the common femoral vein. No evidence of DVT.  On the right side she has incompetence of the greater saphenous vein and also deep venous reflux in the right common femoral vein. There is no evidence of DVT.  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 I've discussed with her the importance of intermittent leg elevation and the proper positioning for this. In addition I have given her a prescription for a thigh high compression stockings with a gradient of 20-30 mm of mercury. All have her follow up in 3 months to see how her symptoms are. If she is continuing to have symptoms given that her duplex study shows incompetence of the greater saphenous veins she did potentially be considered for laser ablation of the saphenous veins. We will set her up to see Dr. Hart Rochester or Dr. Arbie Cookey in 3 months. He knows to call sooner if she has problems.   Tamara [name] S [facility] Beeper: [phone]

## 2011-12-24 ENCOUNTER — Other Ambulatory Visit: Payer: Self-pay

## 2011-12-24 DIAGNOSIS — I83893 Varicose veins of bilateral lower extremities with other complications: Secondary | ICD-10-CM

## 2012-01-08 ENCOUNTER — Other Ambulatory Visit: Payer: BC Managed Care – PPO

## 2012-01-08 ENCOUNTER — Encounter: Payer: BC Managed Care – PPO | Admitting: Vascular Surgery

## 2012-04-08 ENCOUNTER — Ambulatory Visit: Payer: BC Managed Care – PPO | Admitting: Vascular Surgery

## 2012-07-01 ENCOUNTER — Encounter: Payer: Self-pay | Admitting: Family

## 2012-07-01 ENCOUNTER — Ambulatory Visit (INDEPENDENT_AMBULATORY_CARE_PROVIDER_SITE_OTHER): Payer: BC Managed Care – PPO | Admitting: Family

## 2012-07-01 VITALS — BP 100/60 | HR 82 | Wt 162.0 lb

## 2012-07-01 DIAGNOSIS — M79609 Pain in unspecified limb: Secondary | ICD-10-CM

## 2012-07-01 DIAGNOSIS — M79675 Pain in left toe(s): Secondary | ICD-10-CM

## 2012-07-01 DIAGNOSIS — I959 Hypotension, unspecified: Secondary | ICD-10-CM

## 2012-07-01 NOTE — Patient Instructions (Signed)
Hypotension  As your heart beats, it forces blood through your arteries. This force is your blood pressure. If your blood pressure is too low for you to go about your normal activities or support the organs of your body, you have hypotension, or low blood pressure. When your blood pressure becomes too low, you may not get enough blood to your brain, and you may feel weak, lightheaded, or develop a more rapid heart rate. In a more severe case, you may faint: this is a sudden, brief loss of consciousness where you pass out and recover completely.  CAUSES   Loss of blood or fluids from the body. This occurs during rapid blood loss. It can also come from dehydration when the body is not taking in enough fluids or is losing fluids faster than they can be replaced. Examples of this would be severe vomiting and diarrhea.   Not taking in enough fluids and salts. This is common in the elderly where thirst mechanisms are not working as well. This means you do not feel thirsty and you do not take in enough water.   Use of blood pressure pills and other medications that may lower the blood pressure below normal.   Over medication (always take your medications as directed).   Irregular heart beat or heart failure when the heart is no longer working well enough to support blood pressure.  Hospitalization is sometimes required for low blood pressure if fluid or blood replacement is needed, if time is needed for medications to wear off, or if further evaluation is needed. Less common causes of low blood pressure might include peripheral or autonomic neuropathy (nerve problems), Parkinson's disease, or other illnesses. Treatment might include a change in diet, change in medications (including medicines aimed at raising your blood pressure), and use of support stockings.  HOME CARE INSTRUCTIONS      Maintain good fluid intake and use a little more salt on your food (if you are not on a restricted diet or having problems with your heart such as heart failure). This is especially important for the elderly when you may not feel thirsty in the winter.   Take your medications as directed.   Get up slowly from reclining or sitting positions. This gives your blood pressure a chance to adjust. As we grow older our ability to regulate our blood pressure may not be as good as when we were younger.   Wear support stockings if prescribed.   Use walkers, canes, etc., if advised.   Talk with your physician or nurse about a Home Safety Evaluation (usually done by visiting nurses).  SEEK IMMEDIATE MEDICAL CARE IF:     You have a fainting episode. Do not drive yourself. Call 911 if no other help is available.   You have chest pain, nausea (feeling sick to your stomach) or vomiting.   You have a loss of feeling in some part of your body, or lose movement in your arms or legs.   You have difficulty with speech.   You become sweaty and/or feel light headed.  Make sure you are re-checked as instructed.  MAKE SURE YOU:     Understand these instructions.   Will watch your condition.   Will get help right away if you are not doing well or get worse.  Document Released: 05/07/2005 Document Revised: 07/30/2011 Document Reviewed: 12/26/2007  [employer] Patient Information 2013 [employer].

## 2012-07-01 NOTE — Progress Notes (Signed)
Subjective:    Patient ID: Tamara [name], female    DOB: 1975/07/13, 37 y.o.   MRN: [id number]  HPI 37 year old white female, nonsmoker, patient of Dr. Cato Mulligan is in today with an original complaint of left great toe pain and numbness x6 months. Pain is especially worse when an extended backwards and pressure is applied. She has not been taken any medication for relief. Rates the pain 8/10 when she applies pressure. Otherwise, she has no pain.   Patient has concerns of periodic chest pains that occur to the left chest particularly when she is anxious. Activity doesn't worsen the severity of the pain. Describes it as periodic occurring about every 3-4 months lasting a few seconds, described as sharp. The pain as 8/10. She especially has concerns because she had bradycardia as a child. Has seen cardiology in the past but it's been many years. Also reports an increase in fatigue recently. Has also noticed a lower blood pressure over the last several months.   Review of Systems  Constitutional: Negative.   HENT: Positive for facial swelling.   Eyes: Negative.   Respiratory: Negative.   Cardiovascular: Positive for chest pain and palpitations. Negative for leg swelling.  Genitourinary: Negative.   Musculoskeletal:       Left great toe pain  Skin: Negative.   Neurological: Negative.   Hematological: Negative.   Psychiatric/Behavioral: Negative.    Past Medical History  Diagnosis Date  . Psoriasis   . Anal fissure     History   Social History  . Marital Status: Married    Spouse Name: N/A    Number of Children: N/A  . Years of Education: N/A   Occupational History  . Not on file.   Social History Main Topics  . Smoking status: Never Smoker   . Smokeless tobacco: Never Used  . Alcohol Use: No  . Drug Use: No  . Sexually Active: Yes   Other Topics Concern  . Not on file   Social History Narrative  . No narrative on file    Past Surgical History  Procedure Laterality  Date  . Mole removal    . Tubal ligation    . Wisdom tooth extraction    . Cesarean section      2 c sections    Family History  Problem Relation Age of Onset  . Heart disease Maternal Grandfather   . Alcohol abuse Mother   . Alcohol abuse Father   . Other Father     varicose veins    No Known Allergies  Current Outpatient Prescriptions on File Prior to Visit  Medication Sig Dispense Refill  . Clobetasol Propionate (CLOBEX SPRAY) 0.05 % external spray Apply topically 2 (two) times daily.        Marland Kitchen desonide (DESOWEN) 0.05 % cream Apply topically 2 (two) times daily.        Marland Kitchen docusate sodium (COLACE) 100 MG capsule Take 100 mg by mouth as needed.       . pseudoephedrine-guaifenesin (MUCINEX D) 60-600 MG per tablet Take 1 tablet by mouth every 12 (twelve) hours.  18 tablet  0  . psyllium (REGULOID) 0.52 G capsule Take 0.52 g by mouth as needed.        No current facility-administered medications on file prior to visit.    BP 100/60  Pulse 82  Wt 162 lb (73.483 kg)  BMI 26.96 kg/m2  SpO2 98%chart    Objective:   Physical Exam  Constitutional: She is oriented  to person, place, and time. She appears well-developed and well-nourished.  HENT:  Right Ear: External ear normal.  Left Ear: External ear normal.  Nose: Nose normal.  Mouth/Throat: Oropharynx is clear and moist.  Neck: Normal range of motion. Neck supple. No thyromegaly present.  Cardiovascular: Normal rate, regular rhythm and normal heart sounds.   Pulmonary/Chest: Effort normal and breath sounds normal.  Abdominal: Soft. Bowel sounds are normal.  Musculoskeletal: Normal range of motion.  Neurological: She is alert and oriented to person, place, and time. She has normal reflexes.  Skin: Skin is warm and dry.  Psychiatric: She has a normal mood and affect.          Assessment & Plan:  Assessment: 1.Left great toe pain 2. Chest pain 3. History of bradycardia 4. Hypotension  Plan: X-ray of the left great  toe will notify patient pending results. The patient request, we'll refer to cardiology for chest pain or bradycardia. I really believe her symptoms are related to anxiety. Patient had numerous other complaints today to include a heaviness in her arms, tingling in her arms, and occasional visual disturbances. However we'll still cardiology times an underlying source for her symptoms. Encouraged healthy diet and exercise.

## 2012-07-07 ENCOUNTER — Ambulatory Visit (INDEPENDENT_AMBULATORY_CARE_PROVIDER_SITE_OTHER)
Admission: RE | Admit: 2012-07-07 | Discharge: 2012-07-07 | Disposition: A | Discharge status: Home / Self Care | Payer: BC Managed Care – PPO | Source: Ambulatory Visit | Attending: Family | Admitting: Family

## 2012-07-07 ENCOUNTER — Other Ambulatory Visit: Payer: Self-pay | Admitting: Family

## 2012-07-07 DIAGNOSIS — M79675 Pain in left toe(s): Secondary | ICD-10-CM

## 2012-07-07 DIAGNOSIS — M79609 Pain in unspecified limb: Secondary | ICD-10-CM

## 2012-07-10 ENCOUNTER — Other Ambulatory Visit: Payer: Self-pay

## 2012-07-10 DIAGNOSIS — R200 Anesthesia of skin: Secondary | ICD-10-CM

## 2012-07-18 ENCOUNTER — Ambulatory Visit: Payer: BC Managed Care – PPO | Admitting: Cardiovascular Disease

## 2012-07-18 ENCOUNTER — Other Ambulatory Visit: Payer: Self-pay | Admitting: *Deleted

## 2012-07-22 ENCOUNTER — Encounter: Payer: Self-pay | Admitting: Cardiovascular Disease

## 2012-07-22 ENCOUNTER — Ambulatory Visit (INDEPENDENT_AMBULATORY_CARE_PROVIDER_SITE_OTHER): Payer: BC Managed Care – PPO | Admitting: Cardiovascular Disease

## 2012-07-22 VITALS — BP 110/74 | HR 50 | Ht 65.0 in | Wt 159.0 lb

## 2012-07-22 DIAGNOSIS — R0789 Other chest pain: Secondary | ICD-10-CM

## 2012-07-22 HISTORY — DX: Other chest pain: R07.89

## 2012-07-22 NOTE — Patient Instructions (Addendum)
Your physician recommends that you schedule a follow-up appointment in: AS NEEDED BASIS, CALL BACK IF YOU DO NOT MAKE PROGRESS OR IF YOU HAVE ANYMORE SYMPTOMS.  Your physician recommends that you continue on your current medications as directed. Please refer to the Current Medication list given to you today.

## 2012-07-22 NOTE — Progress Notes (Signed)
     Tamara [name] Date of Birth  [birth year]       [facility]    [place] [address], [address]  [address] [place], [place]  [address]   [place], [place]  [address] [phone]     [phone]   Fax  [phone]    Fax [phone]  Problem List: 1. Chest pain 2. Dizziness 3. Facial numbnes   History of Present Illness:  Tamara [name] is a 38 yo who is referred here for low BP.  She has some occasional episodes of chest pressure with inspiration.    She just started running several months ago.  She is able to walk/ run for 30 minutes.   She denies any problems ( except for 1 day when she had some mild inspiratory CP)  She also has occasional episodes of light headedness.   She really is not having any true cardiac symptoms - lots of vague feelings and discomforts.  Current Outpatient Prescriptions on File Prior to Visit  Medication Sig Dispense Refill  . Clobetasol Propionate (CLOBEX SPRAY) 0.05 % external spray Apply topically 2 (two) times daily.        Marland Kitchen desonide (DESOWEN) 0.05 % cream Apply topically 2 (two) times daily.        Marland Kitchen docusate sodium (COLACE) 100 MG capsule Take 100 mg by mouth as needed.       . psyllium (REGULOID) 0.52 G capsule Take 0.52 g by mouth as needed.        No current facility-administered medications on file prior to visit.    No Known Allergies  Past Medical History  Diagnosis Date  . Psoriasis   . Anal fissure     Past Surgical History  Procedure Laterality Date  . Mole removal    . Tubal ligation    . Wisdom tooth extraction    . Cesarean section      2 c sections    History  Smoking status  . Never Smoker   Smokeless tobacco  . Never Used    History  Alcohol Use No    Family History  Problem Relation Age of Onset  . Heart disease Maternal Grandfather   . Alcohol abuse Mother   . Alcohol abuse Father   . Other Father     varicose veins    Reviw of Systems:  Reviewed in the  HPI.  All other systems are negative.  Physical Exam: Blood pressure 110/74, pulse 50, height 5\' 5"  (1.651 m), weight 159 lb (72.122 kg), last menstrual period 06/09/2012. General: Well developed, well nourished, in no acute distress.  Head: Normocephalic, atraumatic, sclera non-icteric, mucus membranes are moist,   Neck: Supple. Carotids are 2 + without bruits. No JVD   Lungs: Clear   Heart: RR, S1, S2, soft systolic murmur  Abdomen: Soft, non-tender, non-distended with normal bowel sounds.  Msk:  Strength and tone are normal   Extremities: No clubbing or cyanosis. No edema.  Distal pedal pulses are 2+ and equal    Neuro: CN II - XII intact.  Alert and oriented X 3.   Psych:  Normal   ECG: July 22, 2012:  Sinus brady at 50. Sinus arrhythmia.  Otherwise normal ECg  Assessment / Plan:

## 2012-07-22 NOTE — Assessment & Plan Note (Signed)
Tamara [name] presents today for further evaluation of several vague symptoms. She describes some occasional episodes of chest tightness. These occur at various times and are not necessarily associated with her exercise. She did have one episode of atypical chest pain with her exertion but that has not recurred. That happened several months ago and she has felt fine since that time.  She denies any dyspnea. She denies any syncope or presyncope. She has started an exercise program but stopped about a month ago. She's never been much exercise or in the past but was trying to build up to do a 5K race.  In my opinion, think that all of her symptoms are related to her being just generally deconditioned and trying to exercise more. I think that she decelerated her work out a bit too fast. I've encouraged her to start walking again. If she is able to walk 3-4 miles a day and I think that she can progress to start jogging a little bit. She's unable to progress in her exercise regimen I've asked her to call me back.  I offer to do a stress echocardiogram but she declined. Actually do not think that she needs it. None of her symptoms sound ischemic.  I've asked her to make sure  she eats a good diet.

## 2012-07-28 ENCOUNTER — Telehealth: Payer: Self-pay

## 2012-07-28 NOTE — Telephone Encounter (Signed)
Message copied by Phillips Odor on Mon Jul 28, 2012  9:57 AM ------      Message from: FITZPATRICK, DOROTHY K      Created: Mon Jul 28, 2012  9:22 AM      Contact: [phone]       Patient called. Was supposed to come back to see [place] in November. Didn't want surgery at that time so did not come back. Now has "superficial blood clots on side of knee and is very painful to lift leg" She would like to come in today to see a doctor. Please let me know what you would like me to do - thanks!  ------

## 2012-07-28 NOTE — Telephone Encounter (Addendum)
Spoke with pt. regarding her symptoms.  Stated on Saturday, she noted that her inner aspect of the right knee became painful over a visible "purple/blue vein that has spider veins running across it".  Stated "it hurt to lift the leg".  Noted a lump within the area, initially on Saturday, and "now I see "2 lumps".    States some swelling at the site, but denies any swelling of right foot or lower leg.  Denies redness or warmth.  Advised to use compression stockings, elevate right leg at intervals, and use Ibuprofen, OTC for inflammation.  Advised that she will be contacted with an appt.  Discussed w/ Vein Nurse, and was informed that venous duplex/reflux study does not need repeated prior to appt., if has been done within a year.

## 2012-08-05 ENCOUNTER — Ambulatory Visit: Payer: BC Managed Care – PPO | Admitting: Vascular Surgery

## 2012-08-08 ENCOUNTER — Ambulatory Visit: Payer: BC Managed Care – PPO | Admitting: Neurology

## 2012-09-02 ENCOUNTER — Encounter: Payer: Self-pay | Admitting: Vascular Surgery

## 2012-09-02 ENCOUNTER — Ambulatory Visit (INDEPENDENT_AMBULATORY_CARE_PROVIDER_SITE_OTHER): Payer: BC Managed Care – PPO | Admitting: Vascular Surgery

## 2012-09-02 VITALS — BP 116/74 | HR 46 | Resp 16 | Ht 65.0 in | Wt 161.5 lb

## 2012-09-02 DIAGNOSIS — I83893 Varicose veins of bilateral lower extremities with other complications: Secondary | ICD-10-CM

## 2012-09-02 NOTE — Progress Notes (Signed)
Problems with Activities of Daily Living Secondary to Leg Pain  1. Tamara [name] states that prolonged sitting (especially riding in the car on trips ) is very difficult due to leg pain.  2. Tamara [name] states activities that require prolonged standing (cooking, cleaning, childcare) are very difficult due to leg pain.       Failure of  Conservative Therapy:  1. Worn 20-30 mm Hg thigh high compression hose >3 months with no relief of symptoms.  2. Frequently elevates legs-no relief of symptoms  3. Taken Ibuprofen 600 Mg TID with no relief of symptoms.  The patient presents today for discussion regarding venous pathology. She continues to have discomfort over her thigh and right calf with prolonged standing and also at night after on her feet for a good period of the day. She's had no significant improvement with compression. She does continue to use ibuprofen was some mild relief. Portion she does not have any history of DVT or other serious complications.  Past Medical History  Diagnosis Date  . Psoriasis   . Anal fissure   . Varicose veins     History  Substance Use Topics  . Smoking status: Never Smoker   . Smokeless tobacco: Never Used  . Alcohol Use: No    Family History  Problem Relation Age of Onset  . Heart disease Maternal Grandfather   . Alcohol abuse Mother   . Alcohol abuse Father   . Other Father     varicose veins    No Known Allergies  Current outpatient prescriptions:Clobetasol Propionate (CLOBEX SPRAY) 0.05 % external spray, Apply topically 2 (two) times daily.  , Disp: , Rfl: ;  desonide (DESOWEN) 0.05 % cream, Apply topically 2 (two) times daily.  , Disp: , Rfl: ;  docusate sodium (COLACE) 100 MG capsule, Take 100 mg by mouth as needed. , Disp: , Rfl: ;  psyllium (REGULOID) 0.52 G capsule, Take 0.52 g by mouth as needed. , Disp: , Rfl:   BP 116/74  Pulse 46  Resp 16  Ht 5\' 5"  (1.651 m)  Wt 161 lb 8 oz (73.256 kg)  BMI 26.88 kg/m2  Body mass  index is 26.88 kg/(m^2).       On physical exam she does have a marked enlargement and reticular veins on her posterior thigh and also several of varicose veins on her medial distal thigh. I reimaged her veins with SonoSite ultrasound. This does show reflux in her great saphenous vein extending into multiple branches into her posterior thigh and medial thigh where the varicosities are present  Impression and plan: Continued pain despite conservative treatment of venous hypertension. By duplex she had reflux in her left great saphenous vein as well. She is having minimal symptoms with this and I recommended continued observation only. On the right I would recommend ablation of her great saphenous vein for reduction in her venous hypertension and symptom relief. She'll notify us if he wishes to proceed

## 2013-02-18 ENCOUNTER — Other Ambulatory Visit: Payer: Self-pay | Admitting: Dermatology

## 2013-04-08 ENCOUNTER — Ambulatory Visit (INDEPENDENT_AMBULATORY_CARE_PROVIDER_SITE_OTHER): Payer: BC Managed Care – PPO | Admitting: Family

## 2013-04-08 ENCOUNTER — Encounter: Payer: Self-pay | Admitting: Family

## 2013-04-08 VITALS — BP 112/68 | HR 62 | Temp 98.1°F | Wt 156.0 lb

## 2013-04-08 DIAGNOSIS — R42 Dizziness and giddiness: Secondary | ICD-10-CM

## 2013-04-08 DIAGNOSIS — K625 Hemorrhage of anus and rectum: Secondary | ICD-10-CM

## 2013-04-08 DIAGNOSIS — G8929 Other chronic pain: Secondary | ICD-10-CM

## 2013-04-08 DIAGNOSIS — R1031 Right lower quadrant pain: Secondary | ICD-10-CM

## 2013-04-08 LAB — BASIC METABOLIC PANEL
BUN: 10 mg/dL (ref 6–23)
Calcium: 9.2 mg/dL (ref 8.4–10.5)
Creatinine, Ser: 0.6 mg/dL (ref 0.4–1.2)
GFR: 111.86 mL/min (ref >60.00)
Glucose, Bld: 92 mg/dL (ref 70–99)

## 2013-04-08 LAB — POCT URINALYSIS DIPSTICK
Spec Grav, UA: >=1.03
Urobilinogen, UA: 1
pH, UA: 6

## 2013-04-08 LAB — CBC WITH DIFFERENTIAL/PLATELET
Basophils Relative: 0.3 % (ref 0.0–3.0)
Eosinophils Relative: 0.4 % (ref 0.0–5.0)
HCT: 37 % (ref 36.0–46.0)
Hemoglobin: 12.4 g/dL (ref 12.0–15.0)
Lymphs Abs: 1.9 10*3/uL (ref 0.7–4.0)
Monocytes Relative: 8.4 % (ref 3.0–12.0)
Neutro Abs: 6.3 10*3/uL (ref 1.4–7.7)
WBC: 9.1 10*3/uL (ref 4.5–10.5)

## 2013-04-08 NOTE — Progress Notes (Signed)
Pre visit review using our clinic review tool, if applicable. No additional management support is needed unless otherwise documented below in the visit note. 

## 2013-04-08 NOTE — Progress Notes (Signed)
Subjective:    Patient ID: Tamara [name], female    DOB: 24-Jan-1976, 37 y.o.   MRN: [id number]  HPI 37 year old white female, patient of Dr. Cato Mulligan is in today with complaints of sudden onset of dizziness and lightheaded this morning when she arose from her sleep. She proceeded to get a shower and episode reoccurred. Reports a shower and was very high. Reports similar episodes during her pregnancy. She also reports chronic right lower quadrant pain medicine worked up in the past gynecology. She has a history of cesarean section and a cyst of the right ovary. She is currently on her menstrual cycle. Patient reports a reduction in her weight of about 5 pounds over the last month. She takes ibuprofen as needed for the right lower quadrant pain as it occurs typically helps. She has no pain. She has a history of gestational diabetes.  Patient also complains of persistent rectal bleeding. She seen GI in the past and reports that they removed rectal polyps via colonoscopy. She continues to have rectal bleeding and pain with every bowel movement. The amount of blood areas from simply being on the tissue to being in the toliet  Patient has had tubal ligation. Review of Systems  HENT: Negative.   Respiratory: Negative.   Cardiovascular: Negative.   Gastrointestinal: Positive for abdominal pain. Negative for nausea, vomiting, diarrhea and constipation.  Endocrine: Negative.   Genitourinary: Negative.   Musculoskeletal: Negative.   Skin: Negative.   Neurological: Positive for dizziness and weakness.  Psychiatric/Behavioral: Negative.    Past Medical History  Diagnosis Date  . Psoriasis   . Anal fissure   . Varicose veins     History   Social History  . Marital Status: Married    Spouse Name: N/A    Number of Children: N/A  . Years of Education: N/A   Occupational History  . Not on file.   Social History Main Topics  . Smoking status: Never Smoker   . Smokeless tobacco: Never Used  .  Alcohol Use: No  . Drug Use: No  . Sexual Activity: Yes   Other Topics Concern  . Not on file   Social History Narrative  . No narrative on file    Past Surgical History  Procedure Laterality Date  . Mole removal    . Tubal ligation    . Wisdom tooth extraction    . Cesarean section      2 c sections    Family History  Problem Relation Age of Onset  . Heart disease Maternal Grandfather   . Alcohol abuse Mother   . Alcohol abuse Father   . Other Father     varicose veins    No Known Allergies  Current Outpatient Prescriptions on File Prior to Visit  Medication Sig Dispense Refill  . Clobetasol Propionate (CLOBEX SPRAY) 0.05 % external spray Apply topically 2 (two) times daily.        Marland Kitchen desonide (DESOWEN) 0.05 % cream Apply topically 2 (two) times daily.        Marland Kitchen docusate sodium (COLACE) 100 MG capsule Take 100 mg by mouth as needed.       . psyllium (REGULOID) 0.52 G capsule Take 0.52 g by mouth as needed.        No current facility-administered medications on file prior to visit.    BP 112/68  Pulse 62  Temp(Src) 98.1 F (36.7 C) (Oral)  Wt 156 lb (70.761 kg)chart    Objective:  Physical Exam  Constitutional: She is oriented to person, place, and time. She appears well-developed and well-nourished.  HENT:  Right Ear: External ear normal.  Left Ear: External ear normal.  Nose: Nose normal.  Mouth/Throat: Oropharynx is clear and moist.  Neck: Neck supple.  Cardiovascular: Normal rate, regular rhythm and normal heart sounds.   Pulmonary/Chest: Effort normal and breath sounds normal.  Abdominal: Soft. Bowel sounds are normal.  Musculoskeletal: Normal range of motion.  Neurological: She is alert and oriented to person, place, and time.  Skin: Skin is warm and dry.  Psychiatric: She has a normal mood and affect.          Assessment & Plan:  Assessment: 1. Lightheadedness and dizziness 2. Fatigue 3. Chronic right lower quadrant pain 4. rectal  bleeding  Plan: Labs today to include CBC, TSH, BMP, LFTs, UA and an outpatient and the results. No concerns of pregnancy because she is actively on her menstrual cycle and has had a tubal ligation in the past. I believe the right lower quadrant abdominal pain the scar tissue from previous C-section. Refer to GI for rectal bleeding. Advised B12 supplement once daily. We'll followup with patient pending labs.

## 2013-04-08 NOTE — Patient Instructions (Signed)
Weakness  Weakness is a lack of strength. It may be felt all over the body (generalized) or in one specific part of the body (focal). Some causes of weakness can be serious. You may need further medical evaluation, especially if you are elderly or you have a history of immunosuppression (such as chemotherapy or HIV), kidney disease, heart disease, or diabetes.  CAUSES   Weakness can be caused by many different things, including:  · Infection.  · Physical exhaustion.  · Internal bleeding or other blood loss that results in a lack of red blood cells (anemia).  · Dehydration. This cause is more common in elderly people.  · Side effects or electrolyte abnormalities from medicines, such as pain medicines or sedatives.  · Emotional distress, anxiety, or depression.  · Circulation problems, especially severe peripheral arterial disease.  · Heart disease, such as rapid atrial fibrillation, bradycardia, or heart failure.  · Nervous system disorders, such as Guillain-Barré syndrome, multiple sclerosis, or stroke.  DIAGNOSIS   To find the cause of your weakness, your caregiver will take your history and perform a physical exam. Lab tests or X-rays may also be ordered, if needed.  TREATMENT   Treatment of weakness depends on the cause of your symptoms and can vary greatly.  HOME CARE INSTRUCTIONS   · Rest as needed.  · Eat a well-balanced diet.  · Try to get some exercise every day.  · Only take over-the-counter or prescription medicines as directed by your caregiver.  SEEK MEDICAL CARE IF:   · Your weakness seems to be getting worse or spreads to other parts of your body.  · You develop new aches or pains.  SEEK IMMEDIATE MEDICAL CARE IF:   · You cannot perform your normal daily activities, such as getting dressed and feeding yourself.  · You cannot walk up and down stairs, or you feel exhausted when you do so.  · You have shortness of breath or chest pain.  · You have difficulty moving parts of your body.  · You have weakness  in only one area of the body or on only one side of the body.  · You have a fever.  · You have trouble speaking or swallowing.  · You cannot control your bladder or bowel movements.  · You have black or bloody vomit or stools.  MAKE SURE YOU:  · Understand these instructions.  · Will watch your condition.  · Will get help right away if you are not doing well or get worse.  Document Released: 05/07/2005 Document Revised: 11/06/2011 Document Reviewed: 07/06/2011  [employer]® Patient Information ©2014 [employer].

## 2013-04-10 ENCOUNTER — Telehealth: Payer: Self-pay | Admitting: Internal Medicine

## 2013-04-10 NOTE — Telephone Encounter (Signed)
Pt would like blood work results °

## 2013-04-13 NOTE — Telephone Encounter (Signed)
Left message to advise pt results were mailed however, she can call back if she would like results today

## 2013-05-04 ENCOUNTER — Ambulatory Visit: Payer: BC Managed Care – PPO | Admitting: Gastroenterology

## 2013-05-25 ENCOUNTER — Ambulatory Visit (INDEPENDENT_AMBULATORY_CARE_PROVIDER_SITE_OTHER): Payer: BC Managed Care – PPO | Admitting: Gastroenterology

## 2013-05-25 ENCOUNTER — Encounter: Payer: Self-pay | Admitting: Gastroenterology

## 2013-05-25 ENCOUNTER — Ambulatory Visit (INDEPENDENT_AMBULATORY_CARE_PROVIDER_SITE_OTHER): Payer: BC Managed Care – PPO | Admitting: Family Medicine

## 2013-05-25 VITALS — BP 102/70 | HR 60 | Ht 64.75 in | Wt 159.2 lb

## 2013-05-25 VITALS — BP 112/74 | HR 64 | Temp 98.1°F | Resp 18 | Ht 65.0 in | Wt 159.0 lb

## 2013-05-25 DIAGNOSIS — L03032 Cellulitis of left toe: Secondary | ICD-10-CM

## 2013-05-25 DIAGNOSIS — K921 Melena: Secondary | ICD-10-CM

## 2013-05-25 DIAGNOSIS — L03039 Cellulitis of unspecified toe: Secondary | ICD-10-CM

## 2013-05-25 DIAGNOSIS — K6289 Other specified diseases of anus and rectum: Secondary | ICD-10-CM

## 2013-05-25 MED ORDER — HYDROCORTISONE ACETATE 25 MG RE SUPP: 25.0000 mg | Freq: Every day | RECTAL | Status: DC

## 2013-05-25 MED ORDER — CEPHALEXIN 500 MG PO CAPS: 500.0000 mg | ORAL_CAPSULE | Freq: Four times a day (QID) | ORAL | Status: DC

## 2013-05-25 NOTE — Progress Notes (Addendum)
Subjective:    Patient ID: Tamara [name], female    DOB: 08-16-1975, 38 y.o.   MRN: [id number] Chief Complaint  Patient presents with  . Foot Pain    left toe- x1 day    HPI This chart was scribed for Aquilla Solian, by Lovena Le Day, Scribe. This patient was seen in room 4 and the patient's care was started at 9:37 PM.  HPI Comments: Tamara [name] is a 38 y.o. female who presents to the [facility] complaining of throbbing pain to the nailbed of her left great toe, onset 3 or 4 days ago, began as intermittent pain and worsened to a constant pain over the past 3 or 4 days. She has not tried any medicines except for soaking her toe in a basin of epsom salt for past few nights, once a day (she has not used any antibiotic ointments for this problem). She reports that about 6 months ago w/left great toe, she had discoloration but unsure if this is related to the current issue.   Patient Active Problem List   Diagnosis Date Noted  . Chest tightness 07/22/2012  . Varicose veins of lower extremities with other complications 37/62/8315  . RECTAL FISSURE 07/23/2007  . PSORIASIS 01/01/2007   Past Surgical History  Procedure Laterality Date  . Mole removal    . Tubal ligation    . Wisdom tooth extraction    . Cesarean section      2 c sections   Family History  Problem Relation Age of Onset  . Heart disease Maternal Grandfather   . Alcohol abuse Mother   . Alcohol abuse Father   . Other Father     varicose veins   History   Social History  . Marital Status: Married    Spouse Name: N/A    Number of Children: 2  . Years of Education: N/A   Occupational History  . speech language pathologist    Social History Main Topics  . Smoking status: Never Smoker   . Smokeless tobacco: Never Used  . Alcohol Use: No  . Drug Use: No  . Sexual Activity: Yes   Other Topics Concern  . Not on file   Social History Narrative  . No narrative on file   No Known  Allergies   Review of Systems  Constitutional: Negative for fever and chills.  Respiratory: Negative for shortness of breath.   Cardiovascular: Negative for chest pain.  Gastrointestinal: Negative for nausea.  Musculoskeletal: Positive for arthralgias and myalgias. Negative for back pain, gait problem and joint swelling.       Left great toe pain  Skin: Positive for color change. Negative for rash and wound.  Hematological: Negative for adenopathy. Does not bruise/bleed easily.  Psychiatric/Behavioral: Negative for sleep disturbance.      Objective:   Physical Exam  Nursing note and vitals reviewed. Constitutional: She is oriented to person, place, and time. She appears well-developed and well-nourished. No distress.  HENT:  Head: Normocephalic and atraumatic.  Eyes: Conjunctivae are normal. Right eye exhibits no discharge. Left eye exhibits no discharge.  Neck: Normal range of motion.  Cardiovascular: Normal rate.   Pulmonary/Chest: Effort normal. No respiratory distress.  Musculoskeletal: Normal range of motion. She exhibits no edema.  Neurological: She is alert and oriented to person, place, and time.  Skin: Skin is warm and dry. There is erythema.  Small area of erythema, warmth and mild induration to the medial and proximal aspect of  nail fold to her left great toe. No fluctuance.   Psychiatric: She has a normal mood and affect. Thought content normal.   Triage Vitals: BP 112/74  Pulse 64  Temp(Src) 98.1 F (36.7 C) (Oral)  Resp 18  Ht 5\' 5"  (1.651 m)  Wt 159 lb (72.122 kg)  BMI 26.46 kg/m2  SpO2 100%  LMP 05/05/2013      Assessment & Plan:   Paronychia of toe, left No focal fluctuance for I&D noted.  Start keflex and continue w/epsom salt soaking. Patient agrees.  Meds ordered this encounter  Medications  . DISCONTD: cephALEXin (KEFLEX) 500 MG capsule    Sig: Take 1 capsule (500 mg total) by mouth 4 (four) times daily.    Dispense:  28 capsule    Refill:  0   . cephALEXin (KEFLEX) 500 MG capsule    Sig: Take 1 capsule (500 mg total) by mouth 4 (four) times daily.    Dispense:  28 capsule    Refill:  0    I personally performed the services described in this documentation, which was scribed in my presence. The recorded information has been reviewed and considered, and addended by me as needed.  Delman Cheadle, MD MPH

## 2013-05-25 NOTE — Patient Instructions (Signed)
Paronychia Paronychia is an inflammatory reaction involving the folds of the skin surrounding the fingernail. This is commonly caused by an infection in the skin around a nail. The most common cause of paronychia is frequent wetting of the hands (as seen with bartenders, food servers, nurses or others who wet their hands). This makes the skin around the fingernail susceptible to infection by bacteria (germs) or fungus. Other predisposing factors are:  Aggressive manicuring.  Nail biting.  Thumb sucking. The most common cause is a staphylococcal (a type of germ) infection, or a fungal (Candida) infection. When caused by a germ, it usually comes on suddenly with redness, swelling, pus and is often painful. It may get under the nail and form an abscess (collection of pus), or form an abscess around the nail. If the nail itself is infected with a fungus, the treatment is usually prolonged and may require oral medicine for up to one year. Your caregiver will determine the length of time treatment is required. The paronychia caused by bacteria (germs) may largely be avoided by not pulling on hangnails or picking at cuticles. When the infection occurs at the tips of the finger it is called felon. When the cause of paronychia is from the herpes simplex virus (HSV) it is called herpetic whitlow. TREATMENT  When an abscess is present treatment is often incision and drainage. This means that the abscess must be cut open so the pus can get out. When this is done, the following home care instructions should be followed. HOME CARE INSTRUCTIONS   It is important to keep the affected fingers very dry. Rubber or plastic gloves over cotton gloves should be used whenever the hand must be placed in water.  Keep wound clean, dry and dressed as suggested by your caregiver between warm soaks or warm compresses.  Soak in warm water for fifteen to twenty minutes three to four times per day for bacterial infections. Fungal  infections are very difficult to treat, so often require treatment for long periods of time.  For bacterial (germ) infections take antibiotics (medicine which kill germs) as directed and finish the prescription, even if the problem appears to be solved before the medicine is gone.  Only take over-the-counter or prescription medicines for pain, discomfort, or fever as directed by your caregiver. SEEK IMMEDIATE MEDICAL CARE IF:  You have redness, swelling, or increasing pain in the wound.  You notice pus coming from the wound.  You have a fever.  You notice a bad smell coming from the wound or dressing. Document Released: 10/31/2000 Document Revised: 07/30/2011 Document Reviewed: 07/02/2008 [facility] Patient Information 2014 [place].   Onychomycosis/Fungal Toenails  WHAT IS IT? An infection that lies within the keratin of your nail plate that is caused by a fungus.  WHY ME? Fungal infections affect all ages, sexes, races, and creeds.  There may be many factors that predispose you to a fungal infection such as age, coexisting medical conditions such as diabetes, or an autoimmune disease; stress, medications, fatigue, genetics, etc.  Bottom line: fungus thrives in a warm, moist environment and your shoes offer such a location.  IS IT CONTAGIOUS? Theoretically, yes.  You do not want to share shoes, nail clippers or files with someone who has fungal toenails.  Walking around barefoot in the same room or sleeping in the same bed is unlikely to transfer the organism.  It is important to realize, however, that fungus can spread easily from one nail to the next on the  same foot.  HOW DO WE TREAT THIS?  There are several ways to treat this condition.  Treatment may depend on many factors such as age, medications, pregnancy, liver and kidney conditions, etc.  It is best to ask your doctor which options are available to you.  1. No treatment.   Unlike many other medical concerns, you can live  with this condition.  However for many people this can be a painful condition and may lead to ingrown toenails or a bacterial infection.  It is recommended that you keep the nails cut short to help reduce the amount of fungal nail. 2. Topical treatment.  These range from herbal remedies to prescription strength nail lacquers.  About 40-50% effective, topicals require twice daily application for approximately 9 to 12 months or until an entirely new nail has grown out.  The most effective topicals are medical grade medications available through physicians offices. 3. Oral antifungal medications.  With an 80-90% cure rate, the most common oral medication requires 3 to 4 months of therapy and stays in your system for a year as the new nail grows out.  Oral antifungal medications do require blood work to make sure it is a safe drug for you.  A liver function panel will be performed prior to starting the medication and after the first month of treatment.  It is important to have the blood work performed to avoid any harmful side effects.  In general, this medication safe but blood work is required. 4. Laser Therapy.  This treatment is performed by applying a specialized laser to the affected nail plate.  This therapy is noninvasive, fast, and non-painful.  It is not covered by insurance and is therefore, out of pocket.  The results have been very good with a 80-95% cure rate.  The [facility] is the only practice in the area to offer this therapy. 5. Permanent Nail Avulsion.  Removing the entire nail so that a new nail will not grow back.

## 2013-05-25 NOTE — Progress Notes (Signed)
    History of Present Illness: This is a 38 year old female who reports with rectal bleeding and rectal pain. She underwent colonoscopy in March 2012 showing small internal hemorrhoids and 2 small hyperplastic colon polyps. A CBC, basic metabolic panel, and TSH were unremarkable in November 2014. She relates a 15 year history of frequent rectal bleeding, rectal pain and rectal itching. In 2009 she was diagnosed with a rectal fissure by her primary physician. I evaluated her in 2011 and 2012 and she had no clear evidence of an active fissure at those times. She takes stool softener and fiber supplements fairly regularly. Symptoms are slightly less active when she uses these medications regularly. Denies weight loss, abdominal pain, constipation, diarrhea, change in stool caliber, melena, nausea, vomiting, dysphagia, reflux symptoms, chest pain.  Current Medications, Allergies, Past Medical History, Past Surgical History, Family History and Social History were reviewed in [employer] record.  Physical Exam: General: Well developed , well nourished, no acute distress Head: Normocephalic and atraumatic Eyes:  sclerae anicteric, EOMI Ears: Normal auditory acuity Mouth: No deformity or lesions Lungs: Clear throughout to auscultation Heart: Regular rate and rhythm; no murmurs, rubs or bruits Abdomen: Soft, non tender and non distended. No masses, hepatosplenomegaly or hernias noted. Normal Bowel sounds Rectal: No external or internal lesions, no significant tenderness, Hemoccult-negative brown stool the vault  Musculoskeletal: Symmetrical with no gross deformities  Pulses:  Normal pulses noted Extremities: No clubbing, cyanosis, edema or deformities noted Neurological: Alert oriented x 4, grossly nonfocal Psychological:  Alert and cooperative. Normal mood and affect  Assessment and Recommendations:  1. Small volume rectal bleeding, rectal itching, mild rectal pain. Presumed  hemorrhoidal symptoms although it isn't unusual that her symptoms have been so regularly active for such a long period time. Standard rectal care instructions. Daily stool softeners and fiber supplements. Anusol-HC suppositories daily or twice daily. She is advised to call since her symptoms do not resolve after two 10 day courses of Anusol-HC suppositories to consider further evaluation with a flexible sigmoidoscopy.

## 2013-05-25 NOTE — Patient Instructions (Signed)
We have sent the following medications to your pharmacy for you to pick up at your convenience: Anusol suppositories.  RECTAL CARE INSTRUCTIONS:  1. Sitz Baths twice a day for 10 minutes each. 2. Thoroughly clean and dry the rectum. 3. Put Tucks pad against the rectum at night. 4. Clean the rectum with Balenol lotion after each bowel movement.  Thank you for choosing me and [facility].  Pricilla Riffle. Dagoberto Ligas., MD., Marval Regal

## 2013-05-27 ENCOUNTER — Ambulatory Visit: Payer: BC Managed Care – PPO | Admitting: Podiatrist

## 2013-05-29 ENCOUNTER — Encounter: Payer: Self-pay | Admitting: Podiatrist

## 2013-05-29 ENCOUNTER — Ambulatory Visit (INDEPENDENT_AMBULATORY_CARE_PROVIDER_SITE_OTHER): Payer: BC Managed Care – PPO | Admitting: Podiatrist

## 2013-05-29 VITALS — BP 107/66 | HR 67 | Resp 12 | Ht 64.0 in | Wt 156.0 lb

## 2013-05-29 DIAGNOSIS — L03039 Cellulitis of unspecified toe: Secondary | ICD-10-CM

## 2013-05-29 NOTE — Progress Notes (Signed)
   Subjective:    Patient ID: Tamara [name], female    DOB: 02-15-1976, 38 y.o.   MRN: [id number]  HPI Comments: '' LT GREAT TOENAIL IS SORE AND HAVE DISCOLORATION FOR 6 MONTH. TREATMENT TRIED ANTIBIOTIC.''     Review of Systems  Skin: Positive for color change.  All other systems reviewed and are negative.       Objective:   Physical Exam  Neurovascular status is intact. The left great toenail is ingrown and symptomatic as well as painful and reddish discoloration. No active drainage, pus, or purulence is present. Discomfort with pressure is noted. Musculoskeletal examination reveals good muscle strength, tone and stability bilateral.      Assessment & Plan:  Paronychia left great toenail Treatment options and alternatives discussed.  Recommended an incision and drainage and patient agreed.  left great toe was prepped with alcohol and a 1 to 1 mix of 0.5% marcaine plain and 2% lidocaine plain was administered in a digital block fashion.  The toe was then prepped with betadine solution and exsanguinated.  The offending nail border was then excised and all necrotic tissue was resected.  The area was then cleansed well and antibiotic ointment and a dry sterile dressing was applied.  The patient was dispensed instructions for aftercare.

## 2013-05-29 NOTE — Patient Instructions (Addendum)
ANTIBACTERIAL SOAP INSTRUCTIONS  THE DAY AFTER PROCEDURE     Shower as usual. Before getting out, place a drop of antibacterial liquid soap (Dial) on a wet, clean washcloth.  Gently wipe washcloth over affected area.  Afterward, rinse the area with warm water.  Blot the area dry with a soft cloth and cover with antibiotic ointment (neosporin, polysporin, bacitracin) and band aid or gauze and tape  OR   Place 3-4 drops of antibacterial liquid soap in a quart of warm tap water.  Submerge foot into water for 20 minutes.  If bandage was applied after your procedure, leave on to allow for easy lift off, then remove and continue with soak for the remaining time.  Next, blot area dry with a soft cloth and cover with a bandage.  Apply other medications as directed by your doctor, such as cortisporin otic solution (eardrops) or neosporin antibiotic ointment     Or you may do epsom salt soaks as follows   THE DAY AFTER THE PROCEDURE  Place 1/4 cup of epsom salts in a quart of warm tap water.  Submerge your foot or feet with outer bandage intact for the initial soak; this will allow the bandage to become moist and wet for easy lift off.  Once you remove your bandage, continue to soak in the solution for 20 minutes.  This soak should be done twice a day.  Next, remove your foot or feet from solution, blot dry the affected area and cover.  You may use a band aid large enough to cover the area or use gauze and tape.  Apply other medications to the area as directed by the doctor such as polysporin neosporin.  IF YOUR SKIN BECOMES IRRITATED WHILE USING THESE INSTRUCTIONS, IT IS OKAY TO SWITCH TO  WHITE VINEGAR AND WATER. Or you may use antibacterial soap and water to keep the toe clean

## 2013-06-01 ENCOUNTER — Telehealth: Payer: Self-pay | Admitting: *Deleted

## 2013-06-01 NOTE — Telephone Encounter (Signed)
L 1st toenail fragment sent to [facility] [phone] for determination of fungal elements.

## 2013-06-05 ENCOUNTER — Ambulatory Visit: Payer: BC Managed Care – PPO | Admitting: Podiatrist

## 2013-06-08 ENCOUNTER — Telehealth: Payer: Self-pay | Admitting: *Deleted

## 2013-06-08 ENCOUNTER — Ambulatory Visit: Payer: BC Managed Care – PPO | Admitting: Gastroenterology

## 2013-06-08 NOTE — Telephone Encounter (Signed)
Pt called for toenail fungal results.  I informed pt that the results would return 4-6 weeks after the specimen was sent out, we would call with instructions.

## 2013-07-02 ENCOUNTER — Telehealth: Payer: Self-pay | Admitting: *Deleted

## 2013-07-02 NOTE — Telephone Encounter (Signed)
Dr Valentina Lucks states fungal culture is positive, may have remove all during the nail procedure, watch the area and call if discoloration appears.  See scanned results.

## 2013-07-07 ENCOUNTER — Encounter: Payer: Self-pay | Admitting: Podiatrist

## 2013-07-09 ENCOUNTER — Encounter: Payer: Self-pay | Admitting: Internal Medicine

## 2013-07-10 ENCOUNTER — Encounter: Payer: Self-pay | Admitting: Podiatrist

## 2013-08-19 ENCOUNTER — Other Ambulatory Visit: Payer: Self-pay | Admitting: Dermatology

## 2013-09-02 ENCOUNTER — Encounter: Payer: Self-pay | Admitting: Family

## 2013-09-02 ENCOUNTER — Ambulatory Visit (INDEPENDENT_AMBULATORY_CARE_PROVIDER_SITE_OTHER): Payer: BC Managed Care – PPO | Admitting: Family

## 2013-09-02 VITALS — BP 114/60 | HR 62 | Temp 98.6°F | Wt 156.0 lb

## 2013-09-02 DIAGNOSIS — R079 Chest pain, unspecified: Secondary | ICD-10-CM

## 2013-09-02 DIAGNOSIS — R1032 Left lower quadrant pain: Secondary | ICD-10-CM

## 2013-09-02 DIAGNOSIS — R1031 Right lower quadrant pain: Secondary | ICD-10-CM

## 2013-09-02 NOTE — Patient Instructions (Signed)
Constipation, Adult Constipation is when a person has fewer than 3 bowel movements a week; has difficulty having a bowel movement; or has stools that are dry, hard, or larger than normal. As people grow older, constipation is more common. If you try to fix constipation with medicines that make you have a bowel movement (laxatives), the problem may get worse. Long-term laxative use may cause the muscles of the colon to become weak. A low-fiber diet, not taking in enough fluids, and taking certain medicines may make constipation worse. CAUSES   Certain medicines, such as antidepressants, pain medicine, iron supplements, antacids, and water pills.   Certain diseases, such as diabetes, irritable bowel syndrome (IBS), thyroid disease, or depression.   Not drinking enough water.   Not eating enough fiber-rich foods.   Stress or travel.  Lack of physical activity or exercise.  Not going to the restroom when there is the urge to have a bowel movement.  Ignoring the urge to have a bowel movement.  Using laxatives too much. SYMPTOMS   Having fewer than 3 bowel movements a week.   Straining to have a bowel movement.   Having hard, dry, or larger than normal stools.   Feeling full or bloated.   Pain in the lower abdomen.  Not feeling relief after having a bowel movement. DIAGNOSIS  Your caregiver will take a medical history and perform a physical exam. Further testing may be done for severe constipation. Some tests may include:   A barium enema X-ray to examine your rectum, colon, and sometimes, your small intestine.  A sigmoidoscopy to examine your lower colon.  A colonoscopy to examine your entire colon. TREATMENT  Treatment will depend on the severity of your constipation and what is causing it. Some dietary treatments include drinking more fluids and eating more fiber-rich foods. Lifestyle treatments may include regular exercise. If these diet and lifestyle recommendations  do not help, your caregiver may recommend taking over-the-counter laxative medicines to help you have bowel movements. Prescription medicines may be prescribed if over-the-counter medicines do not work.  HOME CARE INSTRUCTIONS   Increase dietary fiber in your diet, such as fruits, vegetables, whole grains, and beans. Limit high-fat and processed sugars in your diet, such as [place] fries, hamburgers, cookies, candies, and soda.   A fiber supplement may be added to your diet if you cannot get enough fiber from foods.   Drink enough fluids to keep your urine clear or pale yellow.   Exercise regularly or as directed by your caregiver.   Go to the restroom when you have the urge to go. Do not hold it.  Only take medicines as directed by your caregiver. Do not take other medicines for constipation without talking to your caregiver first. [place] IF:   You have bright red blood in your stool.   Your constipation lasts for more than 4 days or gets worse.   You have abdominal or rectal pain.   You have thin, pencil-like stools.  You have unexplained weight loss. MAKE SURE YOU:   Understand these instructions.  Will watch your condition.  Will get help right away if you are not doing well or get worse. Document Released: 02/03/2004 Document Revised: 07/30/2011 Document Reviewed: 02/16/2013 [facility] Patient Information 2014 [place], [place].   Generalized Anxiety Disorder Generalized anxiety disorder (GAD) is a mental disorder. It interferes with life functions, including relationships, work, and school. GAD is different from normal anxiety, which everyone experiences at some point in  their lives in response to specific life events and activities. Normal anxiety actually helps [place] prepare for and get through these life events and activities. Normal anxiety goes away after the event or activity is over.  GAD causes anxiety that is not necessarily related to  specific events or activities. It also causes excess anxiety in proportion to specific events or activities. The anxiety associated with GAD is also difficult to control. GAD can vary from mild to severe. People with severe GAD can have intense waves of anxiety with physical symptoms (panic attacks).  SYMPTOMS The anxiety and worry associated with GAD are difficult to control. This anxiety and worry are related to many life events and activities and also occur more days than not for 6 months or longer. People with GAD also have three or more of the following symptoms (one or more in children):  Restlessness.   Fatigue.  Difficulty concentrating.   Irritability.  Muscle tension.  Difficulty sleeping or unsatisfying sleep. DIAGNOSIS GAD is diagnosed through an assessment by your caregiver. Your caregiver will ask you questions aboutyour mood,physical symptoms, and events in your life. Your caregiver may ask you about your medical history and use of alcohol or drugs, including prescription medications. Your caregiver may also do a physical exam and blood tests. Certain medical conditions and the use of certain substances can cause symptoms similar to those associated with GAD. Your caregiver may refer you to a mental health specialist for further evaluation. TREATMENT The following therapies are usually used to treat GAD:   Medication Antidepressant medication usually is prescribed for long-term daily control. Antianxiety medications may be added in severe cases, especially when panic attacks occur.   Talk therapy (psychotherapy) Certain types of talk therapy can be helpful in treating GAD by providing support, education, and guidance. A form of talk therapy called cognitive behavioral therapy can teach you healthy ways to think about and react to daily life events and activities.  Stress managementtechniques These include yoga, meditation, and exercise and can be very helpful when they  are practiced regularly. A mental health specialist can help determine which treatment is best for you. Some people see improvement with one therapy. However, other people require a combination of therapies. Document Released: 09/01/2012 Document Reviewed: 09/01/2012 [facility] Patient Information 2014 [place], [place].

## 2013-09-02 NOTE — Progress Notes (Signed)
Subjective:    Patient ID: Tamara [name], female    DOB: 1975/10/01, 38 y.o.   MRN: [id number]  Abdominal Pain    38 year old white female, nonsmokers in today with complaints of abdominal pain ongoing x2 months typically at night, occurring to the lower portion of the abdomen. Has had a history of chronic constipation for which she takes Colace and an increased fiber diet. She's been under the care of gastroenterology and has had a colonoscopy performed in February 2012 that revealed benign colon polyps and she had removed. Continues to have blood in her stool. However, reports recently seeing gastroenterology.  Patient also has complaints of pain with taking a deep breath does not void in one year off and on. Denies any shortness of breath. Reports a high level of stress in her life that she deems normal.   Patient reports googling symptoms and is worried about her pancreas amylase and pancreatic enzymes checked.     Review of Systems  Constitutional: Negative.   HENT: Negative.   Respiratory: Negative.   Cardiovascular: Positive for chest pain. Negative for palpitations and leg swelling.  Gastrointestinal: Positive for abdominal pain.  Endocrine: Negative.   Genitourinary: Negative.   Musculoskeletal: Negative.   Skin: Negative.   Allergic/Immunologic: Negative.   Neurological: Negative.   Psychiatric/Behavioral: Negative.    Past Medical History  Diagnosis Date  . Psoriasis   . Anal fissure   . Varicose veins   . Internal hemorrhoids     History   Social History  . Marital Status: Married    Spouse Name: N/A    Number of Children: 2  . Years of Education: N/A   Occupational History  . speech language pathologist    Social History Main Topics  . Smoking status: Never Smoker   . Smokeless tobacco: Never Used  . Alcohol Use: No  . Drug Use: No  . Sexual Activity: Yes   Other Topics Concern  . Not on file   Social History Narrative  . No narrative on  file    Past Surgical History  Procedure Laterality Date  . Mole removal    . Tubal ligation    . Wisdom tooth extraction    . Cesarean section      2 c sections    Family History  Problem Relation Age of Onset  . Heart disease Maternal Grandfather   . Alcohol abuse Mother   . Alcohol abuse Father   . Other Father     varicose veins    No Known Allergies  Current Outpatient Prescriptions on File Prior to Visit  Medication Sig Dispense Refill  . Clobetasol Propionate (CLOBEX SPRAY) 0.05 % external spray Apply topically 2 (two) times daily.        Marland Kitchen desonide (DESOWEN) 0.05 % cream Apply topically 2 (two) times daily.        Marland Kitchen docusate sodium (COLACE) 100 MG capsule Take 100 mg by mouth as needed.       . hydrocortisone (ANUSOL-HC) 25 MG suppository Place 1 suppository (25 mg total) rectally at bedtime.  10 suppository  1  . psyllium (REGULOID) 0.52 G capsule Take 0.52 g by mouth as needed.        No current facility-administered medications on file prior to visit.    BP 114/60  Pulse 62  Temp(Src) 98.6 F (37 C) (Oral)  Wt 156 lb (70.761 kg)  SpO2 98%chart    Objective:   Physical Exam  Constitutional: She  is oriented to person, place, and time. She appears well-developed and well-nourished.  HENT:  Right Ear: External ear normal.  Left Ear: External ear normal.  Nose: Nose normal.  Mouth/Throat: Oropharynx is clear and moist.  Neck: Normal range of motion. Neck supple.  Cardiovascular: Normal rate, regular rhythm and normal heart sounds.   Pulmonary/Chest: Effort normal and breath sounds normal.  Abdominal: Soft. Bowel sounds are normal.  Musculoskeletal: Normal range of motion.  Neurological: She is alert and oriented to person, place, and time.  Skin: Skin is warm.  Psychiatric: She has a normal mood and affect.          Assessment & Plan:  Gracie was seen today for abdominal pain.  Diagnoses and associated orders for this visit:  Abdominal pain,  left lower quadrant - Amylase - Lipase - CBC with Differential - DG Abd 1 View; Future - DG Chest 2 View; Future  Abdominal pain, right lower quadrant - Amylase - Lipase - CBC with Differential - DG Abd 1 View; Future - DG Chest 2 View; Future  Chest pain - Amylase - Lipase - CBC with Differential - DG Abd 1 View; Future - DG Chest 2 View; Future   Call the office with any questions or concerns. Underlying I believe she has anxiety. We will check pancreatic enzymes at patient request.

## 2013-09-02 NOTE — Progress Notes (Signed)
Pre visit review using our clinic review tool, if applicable. No additional management support is needed unless otherwise documented below in the visit note. 

## 2013-09-03 LAB — CBC WITH DIFFERENTIAL/PLATELET
Basophils Absolute: 0.1 10*3/uL (ref 0.0–0.1)
Basophils Relative: 1.5 % (ref 0.0–3.0)
Eosinophils Absolute: 0.1 10*3/uL (ref 0.0–0.7)
Eosinophils Relative: 0.9 % (ref 0.0–5.0)
HEMATOCRIT: 37.7 % (ref 36.0–46.0)
HEMOGLOBIN: 12.9 g/dL (ref 12.0–15.0)
LYMPHS ABS: 2.4 10*3/uL (ref 0.7–4.0)
Lymphocytes Relative: 28.9 % (ref 12.0–46.0)
MCHC: 34.3 g/dL (ref 30.0–36.0)
MCV: 90.5 fl (ref 78.0–100.0)
MONO ABS: 0.8 10*3/uL (ref 0.1–1.0)
Monocytes Relative: 10.3 % (ref 3.0–12.0)
NEUTROS ABS: 4.8 10*3/uL (ref 1.4–7.7)
Neutrophils Relative %: 58.4 % (ref 43.0–77.0)
PLATELETS: 357 10*3/uL (ref 150.0–400.0)
RBC: 4.17 Mil/uL (ref 3.87–5.11)
RDW: 12.4 % (ref 11.5–14.6)
WBC: 8.2 10*3/uL (ref 4.5–10.5)

## 2013-09-03 LAB — LIPASE: Lipase: 25 U/L (ref 11.0–59.0)

## 2013-09-03 LAB — AMYLASE: Amylase: 70 U/L (ref 27–131)

## 2013-09-10 ENCOUNTER — Ambulatory Visit (INDEPENDENT_AMBULATORY_CARE_PROVIDER_SITE_OTHER)
Admission: RE | Admit: 2013-09-10 | Discharge: 2013-09-10 | Disposition: A | Discharge status: Home / Self Care | Payer: BC Managed Care – PPO | Source: Ambulatory Visit | Attending: Family | Admitting: Family

## 2013-09-10 DIAGNOSIS — R079 Chest pain, unspecified: Secondary | ICD-10-CM

## 2013-09-10 DIAGNOSIS — R1031 Right lower quadrant pain: Secondary | ICD-10-CM

## 2013-09-10 DIAGNOSIS — R1032 Left lower quadrant pain: Secondary | ICD-10-CM

## 2013-10-26 ENCOUNTER — Ambulatory Visit: Payer: BC Managed Care – PPO | Admitting: Family

## 2013-11-27 ENCOUNTER — Ambulatory Visit (INDEPENDENT_AMBULATORY_CARE_PROVIDER_SITE_OTHER): Payer: BC Managed Care – PPO | Admitting: Family Medicine

## 2013-11-27 ENCOUNTER — Encounter: Payer: Self-pay | Admitting: Family Medicine

## 2013-11-27 VITALS — BP 92/60 | HR 71 | Temp 98.5°F | Ht 64.0 in | Wt 160.0 lb

## 2013-11-27 DIAGNOSIS — H6993 Unspecified Eustachian tube disorder, bilateral: Secondary | ICD-10-CM

## 2013-11-27 DIAGNOSIS — H6990 Unspecified Eustachian tube disorder, unspecified ear: Secondary | ICD-10-CM

## 2013-11-27 DIAGNOSIS — R51 Headache: Secondary | ICD-10-CM

## 2013-11-27 DIAGNOSIS — I9589 Other hypotension: Secondary | ICD-10-CM

## 2013-11-27 DIAGNOSIS — F411 Generalized anxiety disorder: Secondary | ICD-10-CM

## 2013-11-27 NOTE — Progress Notes (Signed)
Pre visit review using our clinic review tool, if applicable. No additional management support is needed unless otherwise documented below in the visit note. 

## 2013-11-27 NOTE — Patient Instructions (Signed)
-  zyrtec daily  -nasocort daily  -headache journal  Follow up in 1 month (New Patient Visit if available)

## 2013-11-27 NOTE — Progress Notes (Signed)
No chief complaint on file.   HPI:  Acute visit for:  1) Headache: -PCP is Dr. Leanne Chang -per [facility] hx of anxiety -reports: had a little pressure in her ears in June - resolved, had a headache 5 days ago that resolved, hx of several headaches in last few years -pain was in post neck and band around head, cold pack and ibuprofen helped -denies: nausea, SOB, vomiting, diarrhea, sinus issues, fevers, vision changes, weakness, numbness -she is very anxious, mentions tingling in hand several years ago and last month - evaluated by PCP in the past and none in over one month, chronic low blood pressure, very rare palpitations - evaluated by cardiologist in the past - she thought all of this was related to the headache she had last weekend that is now resolved  ROS: See pertinent positives and negatives per HPI.  Past Medical History  Diagnosis Date  . Psoriasis   . Anal fissure   . Varicose veins   . Internal hemorrhoids     Past Surgical History  Procedure Laterality Date  . Mole removal    . Tubal ligation    . Wisdom tooth extraction    . Cesarean section      2 c sections    Family History  Problem Relation Age of Onset  . Heart disease Maternal Grandfather   . Alcohol abuse Mother   . Alcohol abuse Father   . Other Father     varicose veins    History   Social History  . Marital Status: Married    Spouse Name: N/A    Number of Children: 2  . Years of Education: N/A   Occupational History  . speech language pathologist    Social History Main Topics  . Smoking status: Never Smoker   . Smokeless tobacco: Never Used  . Alcohol Use: No  . Drug Use: No  . Sexual Activity: Yes   Other Topics Concern  . None   Social History Narrative  . None    Current outpatient prescriptions:Clobetasol Propionate (CLOBEX SPRAY) 0.05 % external spray, Apply topically 2 (two) times daily.  , Disp: , Rfl: ;  desonide (DESOWEN) 0.05 % cream, Apply topically 2 (two) times daily.  ,  Disp: , Rfl: ;  docusate sodium (COLACE) 100 MG capsule, Take 100 mg by mouth as needed. , Disp: , Rfl: ;  psyllium (REGULOID) 0.52 G capsule, Take 0.52 g by mouth as needed. , Disp: , Rfl:   EXAM:  Filed Vitals:   11/27/13 1513  BP: 92/60  Pulse: 71  Temp: 98.5 F (36.9 C)    Body mass index is 27.45 kg/(m^2).  GENERAL: vitals reviewed and listed above, alert, oriented, appears well hydrated and in no acute distress  HEENT: atraumatic, conjunttiva clear, no obvious abnormalities on inspection of external nose and ears, normal appearance of ear canals and TMs except for clear effusion bilaterally, clear nasal congestion, mild post oropharyngeal erythema with PND, no tonsillar edema or exudate, no sinus TTP  NECK: no obvious masses on inspection  LUNGS: clear to auscultation bilaterally, no wheezes, rales or rhonchi, good air movement  CV: HRRR, no peripheral edema  MS: moves all extremities without noticeable abnormality  NEURO: CN II-XII groslly intact, PERRLA, finger to nose normal  PSYCH: pleasant and cooperative, anxious  ASSESSMENT AND PLAN:  Discussed the following assessment and plan:  Eustachian tube disorder, bilateral  GAD (generalized anxiety disorder)  Other specified hypotension  Headache(784.0)  -we discussed  possible serious and likely etiologies, workup and treatment, treatment risks and return precautions - I think anxiety is her main issue discussed, will tx her eustachian tube dysfunction, headache resolved and likely tension headache, trial of increased sodium in diet - no symptoms from her low blood pressure and chronic - eval with cards if she wishes -after this discussion, Jonique opted for tx per above -follow up advised in 1 month -of course, we advised Chisom  to return or notify a doctor immediately if symptoms worsen or persist or new concerns arise.   -Patient advised to return or notify a doctor immediately if symptoms worsen or persist or  new concerns arise.  Patient Instructions  -zyrtec daily  -nasocort daily  -headache journal  Follow up in 1 month (New Patient Visit if available)     Lucretia Kern.

## 2014-01-05 ENCOUNTER — Encounter: Payer: Self-pay | Admitting: Family Medicine

## 2014-01-05 ENCOUNTER — Ambulatory Visit (INDEPENDENT_AMBULATORY_CARE_PROVIDER_SITE_OTHER): Payer: BC Managed Care – PPO | Admitting: Family Medicine

## 2014-01-05 VITALS — BP 94/62 | HR 62 | Temp 97.9°F | Ht 66.25 in | Wt 160.5 lb

## 2014-01-05 DIAGNOSIS — H6993 Unspecified Eustachian tube disorder, bilateral: Secondary | ICD-10-CM

## 2014-01-05 DIAGNOSIS — R0989 Other specified symptoms and signs involving the circulatory and respiratory systems: Secondary | ICD-10-CM

## 2014-01-05 DIAGNOSIS — J329 Chronic sinusitis, unspecified: Secondary | ICD-10-CM

## 2014-01-05 DIAGNOSIS — Z7189 Other specified counseling: Secondary | ICD-10-CM

## 2014-01-05 DIAGNOSIS — H6990 Unspecified Eustachian tube disorder, unspecified ear: Secondary | ICD-10-CM

## 2014-01-05 DIAGNOSIS — K5900 Constipation, unspecified: Secondary | ICD-10-CM

## 2014-01-05 DIAGNOSIS — I9589 Other hypotension: Secondary | ICD-10-CM

## 2014-01-05 DIAGNOSIS — Z7689 Persons encountering health services in other specified circumstances: Secondary | ICD-10-CM

## 2014-01-05 HISTORY — DX: Constipation, unspecified: K59.00

## 2014-01-05 NOTE — Progress Notes (Signed)
Pre visit review using our clinic review tool, if applicable. No additional management support is needed unless otherwise documented below in the visit note. 

## 2014-01-05 NOTE — Patient Instructions (Signed)
-  call your cardiologist for an appointment about the dizziness with standing, low blood pressure, pulse in abdomen  -consider seeing the ENT doctor about the fluid in the ears and sinus issues  -We placed a referral for you as discussed for the [place]. It usually takes about 1-2 weeks to process and schedule this referral. If you have not heard from [place] regarding this appointment in 2 weeks please contact our office.  -follow up in 3 months for morning appt and come fasting

## 2014-01-05 NOTE — Progress Notes (Signed)
No chief complaint on file.   HPI:  Tamara [name] is here to establish care.  Last PCP and physical:  Has the following chronic problems and concerns today:  Patient Active Problem List   Diagnosis Date Noted  . Unspecified constipation 01/05/2014  . RECTAL FISSURE - sees the gastroenterologist 07/23/2007  . PSORIASIS - sees Dr. Delman Cheadle in Dermatology 01/01/2007   Chronic Low BP: -mild dizziness when stands up, intermittent mild chest pressure -briefly, chronic, reports saw cardiologist and they told her they thought everything was ok  -has eustachian tube dysfuncton, using nasacort and zyrtec daily -unchanged, not improved  Headaches: -completely resolved -still has popping in ears and pressure in ears at times  Pulse in abdomen when exercises: -no pain in abd or flank or back - except for chronic occ brief pain in abd related to chronic GI issues -denies syncope, chronic mild dizziness (see above) -no hx smoking, pad, cholesterol issues, fh AAA or collagen vascular dz  ROS negative for unless reported above: fevers, unintentional weight loss, hearing or vision loss, chest pain, palpitations, struggling to breath, hemoptysis, hematuria, falls, loc, si, thoughts of self harm Has hx of small amount of blood in stools, seeing GI for this  Past Medical History  Diagnosis Date  . Psoriasis   . Anal fissure   . Varicose veins   . Internal hemorrhoids   . Chest tightness 07/22/2012    Family History  Problem Relation Age of Onset  . Heart disease Maternal Grandfather   . Alcohol abuse Mother   . Alcohol abuse Father   . Other Father     varicose veins    History   Social History  . Marital Status: Married    Spouse Name: N/A    Number of Children: 2  . Years of Education: N/A   Occupational History  . speech language pathologist    Social History Main Topics  . Smoking status: Never Smoker   . Smokeless tobacco: Never Used  . Alcohol Use: No  . Drug Use: No   . Sexual Activity: Yes   Other Topics Concern  . None   Social History Narrative   Work or School: Psychologist, clinical      Home Situation: lives with husband and son and daughter and granmother      Spiritual Beliefs: Christian      Lifestyle: no regular CV exercise; diet is good             Current outpatient prescriptions:cetirizine (ZYRTEC) 10 MG tablet, Take 10 mg by mouth daily., Disp: , Rfl: ;  Clobetasol Propionate (CLOBEX SPRAY) 0.05 % external spray, Apply topically 2 (two) times daily.  , Disp: , Rfl: ;  desonide (DESOWEN) 0.05 % cream, Apply topically 2 (two) times daily.  , Disp: , Rfl: ;  docusate sodium (COLACE) 100 MG capsule, Take 100 mg by mouth as needed. , Disp: , Rfl:   EXAM:  Filed Vitals:   01/05/14 1625  BP: 94/62  Pulse: 62  Temp: 97.9 F (36.6 C)    Body mass index is 25.7 kg/(m^2).  GENERAL: vitals reviewed and listed above, alert, oriented, appears well hydrated and in no acute distress  HEENT: atraumatic, conjunttiva clear, no obvious abnormalities on inspection of external nose and ears, normal appearance of ear canals and TMs except bilat clear effussion, clear nasal congestion, mild post oropharyngeal erythema with PND, no tonsillar edema or exudate, no sinus TTP  NECK: no obvious masses on inspection  LUNGS: clear to auscultation bilaterally, no wheezes, rales or rhonchi, good air movement  CV: HRRR, no peripheral edema  ABD: prominent abd aortic pulse, no bruit  MS: moves all extremities without noticeable abnormality  PSYCH: pleasant and cooperative, no obvious depression or anxiety  ASSESSMENT AND PLAN:  Discussed the following assessment and plan:  Unspecified constipation -advised fiber supplement daily, mirilax prn, follow up with GI Prominent abdominal aortic pulse - Plan: [place] Screening AAA, CANCELED: [place] Screening AAA  Other specified hypotension -evaluated by cards in the past per her report, chronic per review  of chart back to 2008 BP in 100/60 range, discussed options and she opted to follow up with cardiologist, consider stress echo given occ chest discomfort ongoing   Eustachian tube disorder, bilateral Rhinosiusitis -discussed options, not resolved with antihistamine and INS, she will consider seeing ENT  Prominent abdominal aortic pulse -we discussed possible serious and likely etiologies, workup and treatment, treatment risks and return precautions - no risk factors for AAA or new symptoms other then pulse in abd for some time -after this discussion, Tiffanyann opted for [place] and she will see her cardiologist -of course, we advised Marsa  to return or notify a doctor immediately if symptoms worsen or persist or new concerns arise.  Encounter to establish care  -We reviewed the PMH, PSH, FH, SH, Meds and Allergies. -We provided refills for any medications we will prescribe as needed. -We addressed current concerns per orders and patient instructions. -We have asked for records for pertinent exams, studies, vaccines and notes from previous providers. -We have advised patient to follow up per instructions below.   -Patient advised to return or notify a doctor immediately if symptoms worsen or persist or new concerns arise.  Patient Instructions  -call your cardiologist for an appointment about the dizziness with standing, low blood pressure, pulse in abdomen  -consider seeing the ENT doctor about the fluid in the ears and sinus issues  -We placed a referral for you as discussed for the [place]. It usually takes about 1-2 weeks to process and schedule this referral. If you have not heard from [place] regarding this appointment in 2 weeks please contact our office.  -follow up in 3 months for morning appt and come fasting      Wilho Sharpley R.

## 2014-01-11 ENCOUNTER — Ambulatory Visit
Admission: RE | Admit: 2014-01-11 | Discharge: 2014-01-11 | Disposition: A | Discharge status: Home / Self Care | Payer: BC Managed Care – PPO | Source: Ambulatory Visit | Attending: Family Medicine | Admitting: Family Medicine

## 2014-01-11 DIAGNOSIS — R0989 Other specified symptoms and signs involving the circulatory and respiratory systems: Secondary | ICD-10-CM

## 2014-03-05 ENCOUNTER — Ambulatory Visit (INDEPENDENT_AMBULATORY_CARE_PROVIDER_SITE_OTHER): Payer: BC Managed Care – PPO | Admitting: Podiatrist

## 2014-03-05 ENCOUNTER — Encounter: Payer: Self-pay | Admitting: Podiatrist

## 2014-03-05 VITALS — BP 129/68 | HR 49 | Resp 13

## 2014-03-05 DIAGNOSIS — Z79899 Other long term (current) drug therapy: Secondary | ICD-10-CM

## 2014-03-05 LAB — HEPATIC FUNCTION PANEL
ALT: 13 U/L (ref 0–35)
AST: 26 U/L (ref 0–37)
Albumin: 4.9 g/dL (ref 3.5–5.2)
Alkaline Phosphatase: 67 U/L (ref 39–117)
BILIRUBIN DIRECT: 0.1 mg/dL (ref 0.0–0.3)
Indirect Bilirubin: 0.3 mg/dL (ref 0.2–1.2)
Total Bilirubin: 0.4 mg/dL (ref 0.2–1.2)
Total Protein: 7.6 g/dL (ref 6.0–8.3)

## 2014-03-05 MED ORDER — TERBINAFINE HCL 250 MG PO TABS: 250.0000 mg | ORAL_TABLET | Freq: Every day | ORAL | Status: DC

## 2014-03-05 NOTE — Patient Instructions (Signed)

## 2014-03-09 NOTE — Progress Notes (Signed)
Chief Complaint  Patient presents with  . Nail Problem    "I'm not sure the fungus is gone and the toe hurts.:" left 1st toenail     HPI: Patient is 38 y.o. female who presents today for recheck of left hallux nail.  We performed a partial non permenent toenail removal and the culture from the toenail was fungal.  She is concerned some of the fungus has come back on the side of the toenail  Physical Exam  Patient is awake, alert, and oriented x 3.  In no acute distress.  Vascular status is intact with palpable pedal pulses at 2/4 DP and PT bilateral and capillary refill time within normal limits. Neurological sensation is also intact bilaterally via Semmes Weinstein monofilament at 5/5 sites. Light touch, vibratory sensation, Achilles tendon reflex is intact. Dermatological exam reveals skin color, turger and texture as normal. No open lesions present.  Musculature intact with dorsiflexion, plantarflexion, inversion, eversion.  Left hallux nail does have a slight strip of discoloration on the lateral nail border and medial nail border. Does appear to be fungal in nature. Some discomfort on the nail is also present subjectively, however there is no redness, no swelling, no sign of ingrown deformity present.  Assessment: Mycotic left hallux nail  Plan: Recommended topical antifungal medication and taping the skin away from the nail to allow the nail to grow past the tuft of skin on the toe. She'll be seen back as needed for followup or if the pain does not subside on the left great toenail with conservative treatments.

## 2014-03-10 ENCOUNTER — Other Ambulatory Visit: Payer: Self-pay | Admitting: Dermatology

## 2014-03-16 ENCOUNTER — Telehealth: Payer: Self-pay | Admitting: *Deleted

## 2014-03-16 NOTE — Telephone Encounter (Signed)
I called and informed the patient that her labs were great per Dr. Valentina Lucks.  She stated, "Whew, thank you!"

## 2014-03-16 NOTE — Telephone Encounter (Signed)
Message copied by Lolita Rieger on Tue Mar 16, 2014  8:26 AM ------      Message from: Bronson Ing      Created: Fri Mar 12, 2014 12:35 AM      Regarding: labs       Labs look great-- ok to take meds. thanks      ----- Message -----         From: Lab in [facility] Interface         Sent: 03/05/2014  11:17 PM           To: Bronson Ing, DPM                   ------

## 2014-03-17 ENCOUNTER — Ambulatory Visit: Payer: BC Managed Care – PPO | Admitting: Gastroenterology

## 2014-03-18 ENCOUNTER — Telehealth: Payer: Self-pay | Admitting: Family Medicine

## 2014-03-18 NOTE — Telephone Encounter (Signed)
I spoke with the pt and she was given numbers from the lab tests.

## 2014-03-18 NOTE — Telephone Encounter (Signed)
Pt would like nurse to return her call with following info. Glucose level and date, total chole, ldl chole, hdl chole,triglycerides all with dates. Pt needs infor to get cheaper ins rate

## 2014-03-31 ENCOUNTER — Encounter: Payer: Self-pay | Admitting: *Deleted

## 2014-04-06 ENCOUNTER — Ambulatory Visit: Payer: BC Managed Care – PPO | Admitting: Gastroenterology

## 2014-04-07 ENCOUNTER — Encounter: Payer: Self-pay | Admitting: Family Medicine

## 2014-04-07 ENCOUNTER — Ambulatory Visit (INDEPENDENT_AMBULATORY_CARE_PROVIDER_SITE_OTHER): Payer: BC Managed Care – PPO | Admitting: Family Medicine

## 2014-04-07 ENCOUNTER — Ambulatory Visit: Payer: BC Managed Care – PPO | Admitting: Family Medicine

## 2014-04-07 VITALS — BP 98/60 | HR 54 | Temp 97.7°F | Ht 65.25 in | Wt 158.6 lb

## 2014-04-07 DIAGNOSIS — Z0000 Encounter for general adult medical examination without abnormal findings: Secondary | ICD-10-CM

## 2014-04-07 LAB — LIPID PANEL
Cholesterol: 117 mg/dL (ref 0–200)
HDL: 44.6 mg/dL (ref >39.00)
LDL CALC: 61 mg/dL (ref 0–99)
NonHDL: 72.4
Total CHOL/HDL Ratio: 3
Triglycerides: 58 mg/dL (ref 0.0–149.0)
VLDL: 11.6 mg/dL (ref 0.0–40.0)

## 2014-04-07 LAB — HEMOGLOBIN A1C: HEMOGLOBIN A1C: 5.5 % (ref 4.6–6.5)

## 2014-04-07 NOTE — Progress Notes (Signed)
Pre visit review using our clinic review tool, if applicable. No additional management support is needed unless otherwise documented below in the visit note. 

## 2014-04-07 NOTE — Patient Instructions (Signed)
-  We have ordered labs or studies at this visit. It can take up to 1-2 weeks for results and processing. We will contact you with instructions IF your results are abnormal. Normal results will be released to your MYCHART. If you have not heard from us or can not find your results in MYCHART in 2 weeks please contact our office.  -PLEASE SIGN UP FOR MYCHART TODAY   We recommend the following healthy lifestyle measures: - eat a healthy diet consisting of lots of vegetables, fruits, beans, nuts, seeds, healthy meats such as white chicken and fish and whole grains.  - avoid fried foods, fast food, processed foods, sodas, red meet and other fattening foods.  - get a least 150 minutes of aerobic exercise per week.    

## 2014-04-07 NOTE — Progress Notes (Signed)
HPI:  Here for CPE:  -Concerns and/or follow up today:   L ear eustachian tube dysfunction: -feeling much better so did not see ENT  -Diet: fair, larger portion sizes  -Exercise: no regular exercise  -Taking folic acid, vitamin D or calcium: no  -Diabetes and Dyslipidemia Screening:FASTING today for this  -Hx of HTN: no  -Vaccines: had this already this year  -pap history: UTD, had with gyn, 05/2013  -FDLMP: oct, 25, 2015  -sexual activity: yes, female partner, no new partners  -wants STI testing: no  -FH breast, colon or ovarian ca see FH Last mammogram: n/a Last colon cancer screening: n/a  -Alcohol, Tobacco, drug use: see social history  Review of Systems - no fevers, unintentional weight loss, vision loss, hearing loss, chest pain, sob, hemoptysis, melena, hematochezia, hematuria, genital discharge, changing or concerning skin lesions, bleeding, bruising, loc, thoughts of self harm or SI  Past Medical History  Diagnosis Date  . Psoriasis   . Anal fissure   . Varicose veins   . Internal hemorrhoids   . Chest tightness 07/22/2012  . Hyperplastic colon polyp     Past Surgical History  Procedure Laterality Date  . Mole removal    . Tubal ligation    . Wisdom tooth extraction    . Cesarean section      2 c sections    Family History  Problem Relation Age of Onset  . Heart disease Maternal Grandfather   . Alcohol abuse Mother   . Alcohol abuse Father   . Other Father     varicose veins    History   Social History  . Marital Status: Married    Spouse Name: N/A    Number of Children: 2  . Years of Education: N/A   Occupational History  . speech language pathologist    Social History Main Topics  . Smoking status: Never Smoker   . Smokeless tobacco: Never Used  . Alcohol Use: No  . Drug Use: No  . Sexual Activity: Yes   Other Topics Concern  . None   Social History Narrative   Work or School: Warehouse manager  Situation: lives with husband and son and daughter and granmother      Spiritual Beliefs: Christian      Lifestyle: no regular CV exercise; diet is good             Current outpatient prescriptions: cetirizine (ZYRTEC) 10 MG tablet, Take 10 mg by mouth daily., Disp: , Rfl: ;  Clobetasol Propionate (CLOBEX SPRAY) 0.05 % external spray, Apply topically 2 (two) times daily.  , Disp: , Rfl: ;  desonide (DESOWEN) 0.05 % cream, Apply topically 2 (two) times daily.  , Disp: , Rfl: ;  docusate sodium (COLACE) 100 MG capsule, Take 100 mg by mouth as needed. , Disp: , Rfl:  psyllium (METAMUCIL) 58.6 % powder, Take 1 packet by mouth daily., Disp: , Rfl: ;  terbinafine (LAMISIL) 250 MG tablet, Take 1 tablet (250 mg total) by mouth daily., Disp: 90 tablet, Rfl: 0  EXAM:  Filed Vitals:   04/07/14 0808  BP: 98/60  Pulse: 54  Temp: 97.7 F (36.5 C)    GENERAL: vitals reviewed and listed below, alert, oriented, appears well hydrated and in no acute distress  HEENT: head atraumatic, PERRLA, normal appearance of eyes, ears, nose and mouth. moist mucus membranes.  NECK: supple, no masses or lymphadenopathy  LUNGS: clear to auscultation bilaterally,  no rales, rhonchi or wheeze  CV: HRRR, no peripheral edema or cyanosis, normal pedal pulses  BREAST: declined  ABDOMEN: bowel sounds normal, soft, non tender to palpation, no masses, no rebound or guarding  GU: declined  RECTAL: refused  SKIN: no rash or abnormal lesions  MS: normal gait, moves all extremities normally  NEURO: CN II-XII grossly intact, normal muscle strength and sensation to light touch on extremities  PSYCH: normal affect, pleasant and cooperative  ASSESSMENT AND PLAN:  Discussed the following assessment and plan:  .Visit for preventive health examination - Plan: Hemoglobin A1c, Lipid Panel  -Discussed and advised all [place] preventive services health task force level A and B recommendations for age, sex and  risks.  -Advised at least 150 minutes of exercise per week and a healthy diet low in saturated fats and sweets and consisting of fresh fruits and vegetables, lean meats such as fish and white chicken and whole grains.  -FASTING labs, studies and vaccines per orders this encounter  Orders Placed This Encounter  Procedures  . Hemoglobin A1c  . Lipid Panel    Patient advised to return to clinic immediately if symptoms worsen or persist or new concerns.  Patient Instructions  -We have ordered labs or studies at this visit. It can take up to 1-2 weeks for results and processing. We will contact you with instructions IF your results are abnormal. Normal results will be released to your [facility] If you have not heard from [place] or can not find your results in [facility] in 2 weeks please contact our office.  -PLEASE SIGN UP FOR MYCHART TODAY   We recommend the following healthy lifestyle measures: - eat a healthy diet consisting of lots of vegetables, fruits, beans, nuts, seeds, healthy meats such as white chicken and fish and whole grains.  - avoid fried foods, fast food, processed foods, sodas, red meet and other fattening foods.  - get a least 150 minutes of aerobic exercise per week.        Return in about 1 year (around 04/08/2015).  Colin Benton R.

## 2014-04-09 ENCOUNTER — Other Ambulatory Visit: Payer: Self-pay | Admitting: Dermatology

## 2014-04-09 ENCOUNTER — Other Ambulatory Visit: Payer: Self-pay | Admitting: Podiatrist

## 2014-04-10 LAB — HEPATIC FUNCTION PANEL
ALBUMIN: 4.4 g/dL (ref 3.5–5.2)
ALK PHOS: 56 U/L (ref 39–117)
ALT: 11 U/L (ref 0–35)
AST: 15 U/L (ref 0–37)
BILIRUBIN TOTAL: 0.4 mg/dL (ref 0.2–1.2)
Bilirubin, Direct: 0.1 mg/dL (ref 0.0–0.3)
Indirect Bilirubin: 0.3 mg/dL (ref 0.2–1.2)
Total Protein: 6.8 g/dL (ref 6.0–8.3)

## 2014-04-19 ENCOUNTER — Telehealth: Payer: Self-pay | Admitting: *Deleted

## 2014-04-19 NOTE — Telephone Encounter (Signed)
I attempted to call the patient.  I left her a message to call me back tomorrow.

## 2014-04-19 NOTE — Telephone Encounter (Signed)
-----   Message from Bronson Ing, DPM sent at 04/14/2014  5:58 PM EST ----- Regarding: labs  Please let her know her labs are great!  OK to finish out the medication.  Thanks! ----- Message -----    From: Lab in [facility] Interface    Sent: 04/10/2014   2:14 AM      To: Bronson Ing, DPM

## 2014-04-21 NOTE — Telephone Encounter (Signed)
Pt states she missed the call on Monday, please leave a message on [phone].  I spoke with pt and informed the labs were good and she could continue the medication to completion.  Pt agreed.

## 2014-05-03 ENCOUNTER — Encounter: Payer: Self-pay | Admitting: Family Medicine

## 2014-05-03 ENCOUNTER — Ambulatory Visit (INDEPENDENT_AMBULATORY_CARE_PROVIDER_SITE_OTHER): Payer: BC Managed Care – PPO | Admitting: Family Medicine

## 2014-05-03 VITALS — BP 100/62 | HR 50 | Temp 98.2°F | Ht 65.25 in | Wt 162.2 lb

## 2014-05-03 DIAGNOSIS — M79661 Pain in right lower leg: Secondary | ICD-10-CM

## 2014-05-03 DIAGNOSIS — R51 Headache: Secondary | ICD-10-CM

## 2014-05-03 DIAGNOSIS — R519 Headache, unspecified: Secondary | ICD-10-CM

## 2014-05-03 DIAGNOSIS — J309 Allergic rhinitis, unspecified: Secondary | ICD-10-CM

## 2014-05-03 NOTE — Progress Notes (Signed)
HPI:  Acute visit for:  Headache: -hx headaches intermittently for several years and chronic rhinosinusitis -chronically intermittent headaches -now with headaches a few times per week -ibuprofen helps -she takes ibuprofen 2-3 times per week for a few months -headaches are a and around the head, sometimes more R sided, sometime mild nause -denies: vision changes, weakness and numbness -reports hx of chronic neck pain and MRI was normal -has had recurrence of her allergies with chronic nasal congestion since stopping her zyrtec  R shin pain: -started a few days ago, mild with weight bearing -no redness, swelling, inability to bear weight, trauma that she is aware of -she thinks there is a small bump here  ROS: See pertinent positives and negatives per HPI.  Past Medical History  Diagnosis Date  . Psoriasis   . Anal fissure   . Varicose veins   . Internal hemorrhoids   . Chest tightness 07/22/2012  . Hyperplastic colon polyp     Past Surgical History  Procedure Laterality Date  . Mole removal    . Tubal ligation    . Wisdom tooth extraction    . Cesarean section      2 c sections    Family History  Problem Relation Age of Onset  . Heart disease Maternal Grandfather   . Alcohol abuse Mother   . Alcohol abuse Father   . Other Father     varicose veins    History   Social History  . Marital Status: Married    Spouse Name: N/A    Number of Children: 2  . Years of Education: N/A   Occupational History  . speech language pathologist    Social History Main Topics  . Smoking status: Never Smoker   . Smokeless tobacco: Never Used  . Alcohol Use: No  . Drug Use: No  . Sexual Activity: Yes   Other Topics Concern  . None   Social History Narrative   Work or School: Warehouse manager Situation: lives with husband and son and daughter and granmother      Spiritual Beliefs: Christian      Lifestyle: no regular CV exercise; diet is good              Current outpatient prescriptions: clobetasol ointment (TEMOVATE) 4.65 %, Apply 1 application topically as needed., Disp: , Rfl: ;  Clobetasol Propionate (CLOBEX SPRAY) 0.05 % external spray, Apply topically 2 (two) times daily.  , Disp: , Rfl: ;  desonide (DESOWEN) 0.05 % cream, Apply topically 2 (two) times daily.  , Disp: , Rfl: ;  docusate sodium (COLACE) 100 MG capsule, Take 100 mg by mouth as needed. , Disp: , Rfl:  psyllium (REGULOID) 0.52 G capsule, Take 0.52 g by mouth daily., Disp: , Rfl: ;  terbinafine (LAMISIL) 250 MG tablet, Take 1 tablet (250 mg total) by mouth daily., Disp: 90 tablet, Rfl: 0  EXAM:  Filed Vitals:   05/03/14 1615  BP: 100/62  Pulse: 50  Temp: 98.2 F (36.8 C)    Body mass index is 26.8 kg/(m^2).  GENERAL: vitals reviewed and listed above, alert, oriented, appears well hydrated and in no acute distress  HEENT: atraumatic, conjunttiva clear, visual acuity grossly intact, PERRLA, no obvious abnormalities on inspection of external nose and ears, normal appearance of ear canals and TMs, clear nasal congestion with boggy turbinates, mild post oropharyngeal erythema with PND, no tonsillar edema or exudate, no sinus TTP, no temporal artery  TTP or bruit  NECK: no obvious masses on inspection, no bruit, no bony TTP  LUNGS: clear to auscultation bilaterally, no wheezes, rales or rhonchi, good air movement  CV: HRRR, no peripheral edema  MS: moves all extremities without noticeable abnormality, no abnormality noted, not bump, swelling or erythema of R lower ext, no bony or soft tissue TTP; normal neck exam except for TTP in subocc muscles and tension here. Normal gaits  NEURO: PERRLA, CN II-XII grossly intact, finger to nose normal  PSYCH: pleasant and cooperative, no obvious depression or anxiety  ASSESSMENT AND PLAN:  Discussed the following assessment and plan:  Frequent headaches - Plan: CT Head Wo Contrast  Allergic rhinitis, unspecified  allergic rhinitis type  Pain in shin, right  -very anxious patient -she has chronic headaches and they worsen with allergies are out of control - she stopped her allergy medication and headache frequency increased   -suspect tension and sinus related -query rebound given frequent analgesic use - discussed and advised not using more then 2 times per week -advised restarting allergy regimen and keeping headache journal -CT head given change in frequency -she opted for observation of shin pain -follow up in 1 month -Patient advised to return or notify a doctor immediately if symptoms worsen or persist or new concerns arise.  There are no Patient Instructions on file for this visit.   Colin Benton R.

## 2014-05-03 NOTE — Progress Notes (Signed)
Pre visit review using our clinic review tool, if applicable. No additional management support is needed unless otherwise documented below in the visit note. 

## 2014-05-03 NOTE — Patient Instructions (Signed)
BEFORE YOU LEAVE: -Follow up in 1 month -neck strain exercises  -do the exercises for the neck at least 4 days per week  -We placed a referral for you as discussed for the CT scan. It usually takes about 1-2 weeks to process and schedule this referral. If you have not heard from [place] regarding this appointment in 2 weeks please contact our office.  -Do not use ibuprofen or other over the counter medications more the 2 times per week for your headaches  -keep a headache journal  -restart the zyrtec and use flonase daily until you see me again

## 2014-05-05 ENCOUNTER — Other Ambulatory Visit: Payer: Self-pay | Admitting: Vascular Surgery

## 2014-05-05 ENCOUNTER — Ambulatory Visit (INDEPENDENT_AMBULATORY_CARE_PROVIDER_SITE_OTHER)
Admission: RE | Admit: 2014-05-05 | Discharge: 2014-05-05 | Disposition: A | Discharge status: Home / Self Care | Payer: BC Managed Care – PPO | Source: Ambulatory Visit | Attending: Family Medicine | Admitting: Family Medicine

## 2014-05-05 ENCOUNTER — Encounter: Payer: Self-pay | Admitting: Family

## 2014-05-05 ENCOUNTER — Inpatient Hospital Stay: Admission: RE | Admit: 2014-05-05 | Payer: BC Managed Care – PPO | Source: Ambulatory Visit

## 2014-05-05 ENCOUNTER — Ambulatory Visit: Payer: BC Managed Care – PPO | Admitting: Family

## 2014-05-05 ENCOUNTER — Ambulatory Visit (INDEPENDENT_AMBULATORY_CARE_PROVIDER_SITE_OTHER): Payer: BC Managed Care – PPO | Admitting: Family

## 2014-05-05 ENCOUNTER — Telehealth: Payer: Self-pay

## 2014-05-05 ENCOUNTER — Other Ambulatory Visit (HOSPITAL_COMMUNITY): Payer: BC Managed Care – PPO

## 2014-05-05 ENCOUNTER — Other Ambulatory Visit: Payer: BC Managed Care – PPO

## 2014-05-05 ENCOUNTER — Ambulatory Visit (HOSPITAL_COMMUNITY)
Admission: RE | Admit: 2014-05-05 | Discharge: 2014-05-05 | Disposition: A | Discharge status: Home / Self Care | Payer: BC Managed Care – PPO | Source: Ambulatory Visit | Attending: Family | Admitting: Family

## 2014-05-05 VITALS — BP 105/66 | HR 57 | Resp 16 | Ht 65.5 in | Wt 158.0 lb

## 2014-05-05 DIAGNOSIS — M79604 Pain in right leg: Secondary | ICD-10-CM

## 2014-05-05 DIAGNOSIS — M7989 Other specified soft tissue disorders: Secondary | ICD-10-CM

## 2014-05-05 DIAGNOSIS — R519 Headache, unspecified: Secondary | ICD-10-CM

## 2014-05-05 DIAGNOSIS — R51 Headache: Secondary | ICD-10-CM

## 2014-05-05 NOTE — Patient Instructions (Signed)

## 2014-05-05 NOTE — Telephone Encounter (Signed)
Phone call from pt.  Reports a one week history of pain in the right calf with slight swelling from ankle to the knee.  Stated the pain is present with walking only.  Denies increased redness or warmth.  Denies hx of DVT.  Stated there is also a 2-3 " raised area, in the front of the leg, that is across one of the areas of varicosities.  Discussed with Dr. Oneida Alar.  Recommended to schedule for right lower extremity venous duplex today.  Pt. Notified. Agrees with plan.

## 2014-05-05 NOTE — Progress Notes (Signed)
Established Venous Insufficiency  History of Present Illness  Tamara [name] is a 38 y.o. (1975-09-24) female patient who was last seen in our office in April 2014 by Dr. Donnetta Hutching. At that time the patient had continued pain despite conservative treatment of venous hypertension. By duplex she had reflux in her left great saphenous vein as well. She was having minimal symptoms with this and Dr. Donnetta Hutching recommended continued observation only. On the right leg Dr. Donnetta Hutching recommended ablation of her great saphenous vein for reduction in her venous hypertension and symptom relief. The patient was advised to notify us if she wished to proceed. The patient returns today with c/o swelling and in her right leg and pain with weight bearing. Mild to moderate pain with weight bearing at right mid tibial area started a week ago. She denies injury.  She also states intermittent nausea for the last couple of months, states she cannot be pregnant as she has had her fallopian tubes ligated.  Right hip pain started about 6 months ago but this has improved, she has an appointment next week with ortho for this. Noted that her CBC in April 2015 was WNL.  Pt denies any history of stroke or TIA, denies any history of DVT but does report superficial thrombosis, does not recall which leg, but it was located on medial aspect of one of her distal thighs. The pt denies any history of vascular procedures on her legs.  The patient states she has had lifelong constipation, denies any significant weight loss or gain, her menses are regular but has menorrhagia a couple of days in her cycle, she has chromic fatigue.  Past Medical History  Diagnosis Date  . Psoriasis   . Anal fissure   . Varicose veins   . Internal hemorrhoids   . Chest tightness 07/22/2012  . Hyperplastic colon polyp     Social History History  Substance Use Topics  . Smoking status: Never Smoker   . Smokeless tobacco: Never Used  . Alcohol Use: No     Family History Family History  Problem Relation Age of Onset  . Heart disease Maternal Grandfather   . Alcohol abuse Mother   . Alcohol abuse Father   . Other Father     varicose veins    Surgical History Past Surgical History  Procedure Laterality Date  . Mole removal    . Tubal ligation    . Wisdom tooth extraction    . Cesarean section      2 c sections    No Known Allergies  Current Outpatient Prescriptions  Medication Sig Dispense Refill  . clobetasol ointment (TEMOVATE) 9.37 % Apply 1 application topically as needed.    . Clobetasol Propionate (CLOBEX SPRAY) 0.05 % external spray Apply topically 2 (two) times daily.      Marland Kitchen desonide (DESOWEN) 0.05 % cream Apply topically 2 (two) times daily.      Marland Kitchen docusate sodium (COLACE) 100 MG capsule Take 100 mg by mouth daily.     . psyllium (REGULOID) 0.52 G capsule Take 0.52 g by mouth daily.    Marland Kitchen terbinafine (LAMISIL) 250 MG tablet Take 1 tablet (250 mg total) by mouth daily. 90 tablet 0   No current facility-administered medications for this visit.    On ROS today: See HPI for pertinent positives and negatives.  Physical Examination  Filed Vitals:   05/05/14 1635  BP: 105/66  Pulse: 57  Resp: 16  Height: 5' 5.5" (1.664 m)  Weight: 158 lb (71.668 kg)  SpO2: 100%   Body mass index is 25.88 kg/(m^2).  General: A&O x 3, WD.  Neck: Thyroid is about 1 1/3 times normal size, isthmus is slightly enlarged.  Pulmonary: Sym exp, good air movt, CTAB, no rales, rhonchi, or wheezing.  Cardiac: RRR, Nl S1, S2, no detected murmur.  Vascular: Vessel Right Left  Radial 2+Palpable 2+Palpable  Carotid audible without bruit  audible without bruit  Aorta Not palpable N/A  Femoral Not Palpable Not Palpable  Popliteal Not palpable Not palpable  PT Not Palpable Not Palpable  DP 2+Palpable 2+Palpable   Gastrointestinal: soft, NTND, -G/R, - HSM, - palpable masses, - CVAT B.  Musculoskeletal: M/S 5/5 throughout,  extremities without ischemic changes. Trace bilateral pretibial pitting edema, no superficial varicosities noted.  Neurologic: Pain and light touch intact in extremities, Motor exam as listed above.  Non-Invasive Vascular Imaging   BLE Venous Insufficiency Duplex (Date: 05/05/2014):  LOWER EXTREMITY VENOUS DUPLEX REFLUX EVALUATION    INDICATION: Left lower extremity pain and swelling    INTERVENTION(S): NA    DUPLEX EXAM: The reflux portion of the duplex exam was performed with the patient in reverse Trendelenburg with a footboard in place and the examination bed at a 30-45 degree angle.        RIGHT VEIN LEFT  Reflux > 500 ms Thrombosis  Reflux > 500 ms Thrombosis  Y N Common Femoral Vein    Y N Femoral Vein    Y N Popliteal Vein    Y N Saphenofemoral Junction    Y N Great Saphenous Vein    N N Small Saphenous Vein      Thrombosis:  N = None, A = Acute, C = Chronic, O = Obstructive, P = Partially Obstructive                                        Reflux:  N = None, Y = Yes       See attached diagram(s) for additional diagnostic information.    NOTE:  Incompetence is defined as continuous reflux > 500 milliseconds with distal augmentation maneuvers or valsalva  ADDITIONAL FINDINGS:     IMPRESSION: Patent and compressible right lower extremity deep venous system from the groin to the knee segment.  Deep venous reflux present as noted above. Incompetent right great saphenous vein. Incompetent right perforator vein at the mid anterior calf as noted on drawing. Competent right small saphenous vein.     Medical Decision Making  Tamara [name] is a 38 y.o. female who presents with: right leg chronic venous insufficiency.  No RLE DVT on today's venous Duplex. Pt's thyroid is slightly enlarged, she has some symptoms of hypothyroidism, see HPI, but reviewing her TSH results over the last 3 years, the TSH has been decreasing closer to the lower end of the normal reference range  indicating trending toward hyperthyroidism; pt advised to discuss this with her PCP.  Based on the patient's vascular studies and examination, I have offered the patient: follow up with Dr. Donnetta Hutching in 3 months to discuss whether right GSV ablation would address her right leg swelling and pain on weight bearing.  I discussed with the patient the use of her 20-30 mm thigh high compression stockings and need for 3 month trial of such.  After 3 months, she will follow up with Dr. Donnetta Hutching for evaluation in  the [facility].  Thank you for allowing [place] to participate in this patient's care.  Clemon Chambers, RN, MSN, FNP-C [facility] Office: [phone]  Clinic MD: Scot Dock  05/05/2014, 4:33 PM

## 2014-05-19 ENCOUNTER — Telehealth: Payer: Self-pay | Admitting: Family Medicine

## 2014-05-19 NOTE — Telephone Encounter (Signed)
Pt was seen on 12/14 and still having leg pain. Pt would like to proceed with getting scan of her legs

## 2014-05-19 NOTE — Telephone Encounter (Signed)
Patient informed. 

## 2014-05-19 NOTE — Telephone Encounter (Signed)
She had a scan with her vascular doctor recently and is seeing an orthopedic doctor. Would advise she discuss the pain with the orthopedic doc to see if anything further needed from a musculoskeletal perspective when she sees them.

## 2014-05-31 ENCOUNTER — Telehealth: Payer: Self-pay | Admitting: *Deleted

## 2014-05-31 ENCOUNTER — Ambulatory Visit (INDEPENDENT_AMBULATORY_CARE_PROVIDER_SITE_OTHER): Payer: BC Managed Care – PPO | Admitting: Gastroenterology

## 2014-05-31 ENCOUNTER — Encounter: Payer: Self-pay | Admitting: Gastroenterology

## 2014-05-31 VITALS — BP 100/70 | HR 64 | Ht 65.5 in | Wt 165.0 lb

## 2014-05-31 DIAGNOSIS — Z79899 Other long term (current) drug therapy: Secondary | ICD-10-CM

## 2014-05-31 DIAGNOSIS — L290 Pruritus ani: Secondary | ICD-10-CM

## 2014-05-31 MED ORDER — ALBENDAZOLE 200 MG PO TABS: 400.0000 mg | ORAL_TABLET | Freq: Every day | ORAL | Status: DC

## 2014-05-31 NOTE — Patient Instructions (Addendum)
We have sent the following medications to your pharmacy for you to pick up at your convenience:  Albenza  Use over the counter Lotrimin cream topically to rectal area twice daily x 1 week.   RECTAL CARE INSTRUCTIONS:  1. Sitz Baths twice a day for 10 minutes each. 2. Thoroughly clean and dry the rectum. 3. Put Tucks pad against the rectum at night. 4. Clean the rectum with Balenol lotion after each bowel movement.

## 2014-05-31 NOTE — Telephone Encounter (Signed)
"  This message is for Tamara [name].  I received your message.  I have had the lab work done before.  I just want another one to make sure everything is okay.  Also, I have a question.  I was reading about the Lamisil which is what I've been taking.  It said let your doctor know if you have Psoriasis when taking this medicine.  I do have Psoriasis.  I was calling to find out what the issue is with that.  Give me a call back.  I returned her call and informed her that Dr. Valentina Lucks will not be in the office until Thursday at which time I will present her with your question.  "Okay that is fine.  Did you get my message?  I have had the blood work done, I had it done before I started and again a month after taking it.  I want to have it done again to make sure everything is okay since I've finished it."  Okay, your form is ready for you to pick up at the front desk.  I'll give you a call when I get a response.

## 2014-05-31 NOTE — Telephone Encounter (Signed)
"  I'm calling because I am on Lamisil.  I'm calling to see if I can get some labwork done to check my liver.  If you would give me a call back.  Thank you.  I called and left her a message that she could come by to pick up the lab requisition.  The order had been placed previously but had apparently not been performed.  If you have anymore questions or concerns please give [place] a call.

## 2014-05-31 NOTE — Progress Notes (Signed)
History of Present Illness: This is a 39 year old female with three-year history of rectal itching. She states the itching is constant and has been present on an ongoing basis for the past 3 years that is worsened over the past year. Her symptoms often increase at nighttime. Hemorrhoidal treatments including standard hemorrhoidal creams standard rectal care instructions have not improved her symptoms at all. She has occasional mild constipation and occasional mild rectal pain with defecation. She's noted occasional trace of blood on the tissue paper when wiping. Colonoscopy performed in March 2012 showed small internal hemorrhoids and 2 small hyperplastic sigmoid colon polyps.  No Known Allergies Outpatient Prescriptions Prior to Visit  Medication Sig Dispense Refill  . clobetasol ointment (TEMOVATE) 7.86 % Apply 1 application topically as needed.    . Clobetasol Propionate (CLOBEX SPRAY) 0.05 % external spray Apply topically 2 (two) times daily.      Marland Kitchen desonide (DESOWEN) 0.05 % cream Apply topically 2 (two) times daily.      Marland Kitchen docusate sodium (COLACE) 100 MG capsule Take 100 mg by mouth daily.     . psyllium (REGULOID) 0.52 G capsule Take 0.52 g by mouth daily.    Marland Kitchen terbinafine (LAMISIL) 250 MG tablet Take 1 tablet (250 mg total) by mouth daily. 90 tablet 0   No facility-administered medications prior to visit.   Past Medical History  Diagnosis Date  . Psoriasis   . Anal fissure   . Varicose veins   . Internal hemorrhoids   . Chest tightness 07/22/2012  . Hyperplastic colon polyp    Past Surgical History  Procedure Laterality Date  . Mole removal    . Tubal ligation    . Wisdom tooth extraction    . Cesarean section      2 c sections   History   Social History  . Marital Status: Married    Spouse Name: N/A    Number of Children: 2  . Years of Education: N/A   Occupational History  . speech language pathologist    Social History Main Topics  . Smoking status: Never  Smoker   . Smokeless tobacco: Never Used  . Alcohol Use: No  . Drug Use: No  . Sexual Activity: Yes   Other Topics Concern  . None   Social History Narrative   Work or School: Warehouse manager Situation: lives with husband and son and daughter and granmother      Spiritual Beliefs: Christian      Lifestyle: no regular CV exercise; diet is good            Family History  Problem Relation Age of Onset  . Heart disease Maternal Grandfather   . Stroke Maternal Grandfather   . Alcohol abuse Mother   . Hypertension Mother   . Alcohol abuse Father   . Other Father     varicose veins  . Varicose Veins Father   . Hypertension Maternal Grandmother   . Heart disease Paternal Grandmother     AAA-  After age 44    Review of Systems: Pertinent positive and negative review of systems were noted in the above HPI section. All other review of systems were otherwise negative.   Physical Exam: General: Well developed , well nourished, no acute distress Head: Normocephalic and atraumatic Eyes:  sclerae anicteric, EOMI Ears: Normal auditory acuity Mouth: No deformity or lesions Neck: Supple, no masses or thyromegaly Lungs: Clear throughout to auscultation Heart:  Regular rate and rhythm; no murmurs, rubs or bruits Abdomen: Soft, non tender and non distended. No masses, hepatosplenomegaly or hernias noted. Normal Bowel sounds Rectal: Very mild perianal erythema otherwise completely normal digital exam with no tenderness or other abnormalities appreciated, Hemoccult-negative brown stool in the vault Musculoskeletal: Symmetrical with no gross deformities  Skin: No lesions on visible extremities Pulses:  Normal pulses noted Extremities: No clubbing, cyanosis, edema or deformities noted Neurological: Alert oriented x 4, grossly nonfocal Cervical Nodes:  No significant cervical adenopathy Inguinal Nodes: No significant inguinal adenopathy Psychological:  Alert and  cooperative. Normal mood and affect  Assessment and Recommendations:  1. Chronic anal/perianal pruritus. Etiology unclear. Possible mild candida infection given mild erythema noted on exam. Treat with Lotrimin twice daily for 1 week. In addition, given her increased symptoms at night, will plan for an empiric treatment for pinworms with albendazole 400 mg daily for 3 days. Standard rectal care instructions. Discontinue hemorrhoidal creams. If her symptoms are not relieved she is advised her to follow-up with her dermatologist for further evaluation.  2. Mild constipation. Increase dietary fiber and water intake. Colace daily or MiraLAX daily as needed.

## 2014-06-03 ENCOUNTER — Ambulatory Visit: Payer: BC Managed Care – PPO | Admitting: Family Medicine

## 2014-06-03 NOTE — Telephone Encounter (Signed)
Yes, do a final liver function panel --  Also, in very rare cases, lamisil can exacerbate psoriasis.  If she hasn't noticed any difference in her psoriasis, she is fine-- again. This is very rare.   Thanks!

## 2014-06-04 NOTE — Telephone Encounter (Signed)
I called and informed her that Dr. Valentina Lucks said Lamisil can cause the flare up of Psoriasis to increase but if it hasn't, you should fine.  "Okay, I still haven't come by to pick up the lab request but I will.  I still have a few more pills to take.  It hasn't cleared up, do I need to schedule another appointment to come in?"  No, it takes a while to see improvement.  Keep looking at the base of the nail, you should see it start to clear up.

## 2014-07-07 ENCOUNTER — Telehealth: Payer: Self-pay | Admitting: Family Medicine

## 2014-07-07 NOTE — Telephone Encounter (Signed)
Pt called to say that she saw a orthpedic about a wart on her leg and they did a [place] and said it was abnormal in the soft tissue but did not think it was any thing bad. Pt is wanting a second opinion since they could not figure out what it is. She said she want to have the scan that Dr Maudie Mercury suggested which she thought was a CT scan

## 2014-07-08 NOTE — Telephone Encounter (Signed)
Please help her schedule appt.Have not seen her in 3 months and did not have a wart to my recollection when I saw her.

## 2014-07-08 NOTE — Telephone Encounter (Signed)
Needs appt. To re-eval.

## 2014-07-08 NOTE — Telephone Encounter (Signed)
I called the pt and she stated it was not a wart on her leg as there is a "lump" on her leg and Dr Maudie Mercury examined this.  Message forwarded to Dr Maudie Mercury.

## 2014-07-09 NOTE — Telephone Encounter (Signed)
Patient informed and appt scheduled for 2/26.

## 2014-07-16 ENCOUNTER — Ambulatory Visit (INDEPENDENT_AMBULATORY_CARE_PROVIDER_SITE_OTHER): Payer: BC Managed Care – PPO | Admitting: Family Medicine

## 2014-07-16 ENCOUNTER — Encounter: Payer: Self-pay | Admitting: Family Medicine

## 2014-07-16 VITALS — BP 102/72 | HR 55 | Temp 98.0°F | Ht 64.75 in | Wt 161.6 lb

## 2014-07-16 DIAGNOSIS — M79661 Pain in right lower leg: Secondary | ICD-10-CM

## 2014-07-16 NOTE — Progress Notes (Signed)
HPI:  R leg pain: -in R shin for several months and she perceives a slight bump in this area, reports this is actually feeling better and rarely has pain anymore -reports saw an orthopedic doctor for this ([facility]) -reports they did [place] and and xray of the area and told her it looked fine - reports med student told her maybe "lipoma" -she has follow up with ortho in a few weeks -she reports I had mentioned (prior to her ortho eval) that if this persisted we could eval it further with a scan and wanted a second opinion prior to ortho f/u to see if needs a CT or MRI -denies: worsening, change in size  ROS: See pertinent positives and negatives per HPI.  Past Medical History  Diagnosis Date  . Psoriasis   . Anal fissure   . Varicose veins   . Internal hemorrhoids   . Chest tightness 07/22/2012  . Hyperplastic colon polyp     Past Surgical History  Procedure Laterality Date  . Mole removal    . Tubal ligation    . Wisdom tooth extraction    . Cesarean section      2 c sections    Family History  Problem Relation Age of Onset  . Heart disease Maternal Grandfather   . Stroke Maternal Grandfather   . Alcohol abuse Mother   . Hypertension Mother   . Alcohol abuse Father   . Other Father     varicose veins  . Varicose Veins Father   . Hypertension Maternal Grandmother   . Heart disease Paternal Grandmother     AAA-  After age 66    History   Social History  . Marital Status: Married    Spouse Name: N/A  . Number of Children: 2  . Years of Education: N/A   Occupational History  . speech language pathologist    Social History Main Topics  . Smoking status: Never Smoker   . Smokeless tobacco: Never Used  . Alcohol Use: No  . Drug Use: No  . Sexual Activity: Yes   Other Topics Concern  . None   Social History Narrative   Work or School: Warehouse manager Situation: lives with husband and son and daughter and granmother       Spiritual Beliefs: Christian      Lifestyle: no regular CV exercise; diet is good              Current outpatient prescriptions:  .  clobetasol ointment (TEMOVATE) 3.08 %, Apply 1 application topically as needed., Disp: , Rfl:  .  Clobetasol Propionate (CLOBEX SPRAY) 0.05 % external spray, Apply topically 2 (two) times daily.  , Disp: , Rfl:  .  desonide (DESOWEN) 0.05 % cream, Apply topically 2 (two) times daily.  , Disp: , Rfl:  .  docusate sodium (COLACE) 100 MG capsule, Take 100 mg by mouth daily. , Disp: , Rfl:  .  psyllium (REGULOID) 0.52 G capsule, Take 0.52 g by mouth daily., Disp: , Rfl:   EXAM:  Filed Vitals:   07/16/14 1609  BP: 102/72  Pulse: 55  Temp: 98 F (36.7 C)    Body mass index is 27.09 kg/(m^2).  GENERAL: vitals reviewed and listed above, alert, oriented, appears well hydrated and in no acute distress  HEENT: atraumatic, conjunttiva clear, no obvious abnormalities on inspection of external nose and ears  NECK: no obvious masses on inspection  MS: moves  all extremities without noticeable abnormality - she has some spider veins of the leg and varicose veins, no signs of thrombosis or inflammation in area of concern on R shin, no skin changes or swelling, ? Minimal increased density of tissue sub cut tissue here, no TTP.  PSYCH: pleasant and cooperative, no obvious depression or anxiety  ASSESSMENT AND PLAN:  Discussed the following assessment and plan:  Pain in shin, right  -seeing ortho for this -discussed etiologies and favor lipoma if any abnormality -advised if tumor (her concern) likely would have been detected by plain films and Korea of this area -discussed CT and MRI but not likely warranted unless ortho feels is needed -advised follow up with ortho as planned - she agreed -Patient advised to return or notify a doctor immediately if symptoms worsen or persist or new concerns arise.  There are no Patient Instructions on file for this  visit.   Colin Benton R.

## 2014-07-16 NOTE — Progress Notes (Signed)
Pre visit review using our clinic review tool, if applicable. No additional management support is needed unless otherwise documented below in the visit note. 

## 2014-08-04 ENCOUNTER — Other Ambulatory Visit: Payer: Self-pay | Admitting: Dermatology

## 2014-08-16 ENCOUNTER — Encounter: Payer: Self-pay | Admitting: Vascular Surgery

## 2014-08-17 ENCOUNTER — Encounter: Payer: Self-pay | Admitting: Vascular Surgery

## 2014-08-17 ENCOUNTER — Ambulatory Visit (INDEPENDENT_AMBULATORY_CARE_PROVIDER_SITE_OTHER): Payer: BC Managed Care – PPO | Admitting: Vascular Surgery

## 2014-08-17 VITALS — BP 109/68 | HR 56 | Resp 18 | Ht 65.0 in | Wt 161.2 lb

## 2014-08-17 DIAGNOSIS — I83891 Varicose veins of right lower extremities with other complications: Secondary | ICD-10-CM | POA: Diagnosis not present

## 2014-08-17 DIAGNOSIS — I83899 Varicose veins of unspecified lower extremities with other complications: Secondary | ICD-10-CM | POA: Insufficient documentation

## 2014-08-17 NOTE — Progress Notes (Signed)
    Established Venous Insufficiency  History of Present Illness  Tamara [name] is a 39 y.o. (08/31/1975) female who presents with chief complaint: pain of her right leg that is associated with prolonged standing. She was last seen by Clemon Chambers, NP on 05/05/14 for evaluation of venous insufficiency. She was found to have reflux of her right great saphenous vein and right deep venous reflux. She was prescribed a three month trial of thigh high compression stockings. She reports that the stockings provide no relief of her pain. She also notes that elevation does not help her symptoms. She continues to report difficulty with sleeping and activities associated with prolonged standing.   Physical Examination  Filed Vitals:   08/17/14 1555  BP: 109/68  Pulse: 56  Resp: 18  Height: 5\' 5"  (1.651 m)  Weight: 161 lb 3.2 oz (73.12 kg)   Body mass index is 26.83 kg/(m^2).  General: A&O x 3, WDWN female in NAD  Pulmonary: Sym exp, good air movt  Vascular: Palpable 2+ femoral and pedal pulses bilaterally. No superficial varicosities noted. No skin changes.   Musculoskeletal: Extremities without ischemic changes.   Neurologic: Pain and light touch intact in extremities   Medical Decision Making  Tamara [name] is a 39 y.o. female who presents with: bilateral lower extremity venous insufficiency, right leg symptomatic. She denies any issues with her left leg.  She has reflux and dilation of her right great saphenous vein with reticular veins of her right posterior thigh. She also has right deep venous reflux. She continues to have significant right leg pain that interferes with daily tasks. She frequently elevates her legs with no relief. She has failed a three month trial of 20-30 mmHg thigh high compression stockings. Discussed endovenous laser ablation of her right great saphenous vein and the risks and benefits associated with the procedure. She is willing to proceed. This will be scheduled  in the next several weeks.    Virgina Jock, PA-C [facility] Office: [phone] Pager: [phone]  08/17/2014, 4:45 PM  This patient was seen in conjunction with Dr. Donnetta Hutching    I have examined the patient, reviewed and agree with above. Changes have significant right leg pain despite maximal conservative treatment. Did reimage her saphenous vein and this does show reflux and an enlarged saphenous vein extending into tributary varicosities in her thigh. Commended ablation of her great saphenous vein for symptom relief. She wished to proceed as soon as possible. Explained the procedure and liver slight risk of DVT associated with procedure  EARLY, TODD, MD 08/17/2014 5:02 PM

## 2014-08-17 NOTE — Progress Notes (Signed)
Problems with Activities of Daily Living Secondary to Leg Pain  1. Tamara [name]  states she has difficulty with all activities that require prolonged standing (cooking, cleaning, shopping, work as Electrical engineer) due to leg pain.   2. Tamara [name] states she has difficulty sleeping due to leg pain.    3. Tamara [name] states she has difficulty traveling especially on car rides due to leg pain.     Failure of  Conservative Therapy:  1. Worn 20-30 mm Hg thigh high compression hose >3 months with no relief of symptoms.  2. Frequently elevates legs-no relief of symptoms  3. Taken Ibuprofen 600 Mg TID with no relief of symptoms.

## 2014-08-23 ENCOUNTER — Other Ambulatory Visit: Payer: Self-pay | Admitting: Orthopedic Surgery

## 2014-08-23 DIAGNOSIS — M79604 Pain in right leg: Secondary | ICD-10-CM

## 2014-08-24 ENCOUNTER — Other Ambulatory Visit: Payer: Self-pay | Admitting: *Deleted

## 2014-08-24 DIAGNOSIS — I83891 Varicose veins of right lower extremities with other complications: Secondary | ICD-10-CM

## 2014-08-27 ENCOUNTER — Encounter: Payer: Self-pay | Admitting: Podiatrist

## 2014-08-27 ENCOUNTER — Ambulatory Visit (INDEPENDENT_AMBULATORY_CARE_PROVIDER_SITE_OTHER): Payer: BC Managed Care – PPO | Admitting: Podiatrist

## 2014-08-27 VITALS — BP 102/60 | HR 64 | Resp 12

## 2014-08-27 DIAGNOSIS — B351 Tinea unguium: Secondary | ICD-10-CM

## 2014-08-27 MED ORDER — EFINACONAZOLE 10 % EX SOLN: 1.0000 [drp] | Freq: Every day | CUTANEOUS | Status: DC

## 2014-08-31 ENCOUNTER — Ambulatory Visit
Admission: RE | Admit: 2014-08-31 | Discharge: 2014-08-31 | Disposition: A | Discharge status: Home / Self Care | Payer: BC Managed Care – PPO | Source: Ambulatory Visit | Attending: Orthopedic Surgery | Admitting: Orthopedic Surgery

## 2014-08-31 DIAGNOSIS — M79604 Pain in right leg: Secondary | ICD-10-CM

## 2014-09-14 NOTE — Progress Notes (Signed)
Chief Complaint  Patient presents with  . Nail Problem    ''TOOK THE RX. LAMISIL AND LT FOOT GREAT TOENAIL IS LOOKING A LITTLE BETTER BUT STILL HAVE DISCOLORATION ON THE SIDE OF THE TOENAIL.''      HPI: Patient is 39 y.o. female who presents today for recheck of left hallux nail.  We performed a partial non permenent toenail removal and the culture from the toenail was fungal.  She is concerned some of the fungus has come back on the side of the toenail  Physical Exam  Patient is awake, alert, and oriented x 3.  In no acute distress.  Vascular status is intact with palpable pedal pulses at 2/4 DP and PT bilateral and capillary refill time within normal limits. Neurological sensation is also intact bilaterally via Semmes Weinstein monofilament at 5/5 sites. Light touch, vibratory sensation, Achilles tendon reflex is intact. Dermatological exam reveals skin color, turger and texture as normal. No open lesions present.  Musculature intact with dorsiflexion, plantarflexion, inversion, eversion.  Left hallux nail does have a slight strip of discoloration on the lateral nail border and medial nail border. Does appear to be fungal in nature.   Assessment: Mycotic left hallux nail  Plan: recommended using jublia on the small area of discoloration until it grows out completely.  She will call if the area does not grow out.

## 2014-09-22 ENCOUNTER — Other Ambulatory Visit (HOSPITAL_COMMUNITY): Payer: Self-pay | Admitting: Dermatology

## 2014-10-28 ENCOUNTER — Other Ambulatory Visit: Payer: BC Managed Care – PPO | Admitting: Vascular Surgery

## 2014-11-04 ENCOUNTER — Encounter (HOSPITAL_COMMUNITY): Payer: BC Managed Care – PPO

## 2014-11-04 ENCOUNTER — Ambulatory Visit: Payer: BC Managed Care – PPO | Admitting: Vascular Surgery

## 2014-11-09 ENCOUNTER — Ambulatory Visit (INDEPENDENT_AMBULATORY_CARE_PROVIDER_SITE_OTHER): Payer: BC Managed Care – PPO

## 2014-11-09 ENCOUNTER — Ambulatory Visit (INDEPENDENT_AMBULATORY_CARE_PROVIDER_SITE_OTHER): Payer: BC Managed Care – PPO | Admitting: Family Medicine

## 2014-11-09 VITALS — BP 118/68 | HR 83 | Temp 99.5°F | Resp 20 | Ht 65.5 in | Wt 162.2 lb

## 2014-11-09 DIAGNOSIS — R05 Cough: Secondary | ICD-10-CM | POA: Diagnosis not present

## 2014-11-09 DIAGNOSIS — J189 Pneumonia, unspecified organism: Secondary | ICD-10-CM | POA: Diagnosis not present

## 2014-11-09 DIAGNOSIS — G933 Postviral fatigue syndrome: Secondary | ICD-10-CM

## 2014-11-09 DIAGNOSIS — R5383 Other fatigue: Secondary | ICD-10-CM

## 2014-11-09 DIAGNOSIS — R059 Cough, unspecified: Secondary | ICD-10-CM

## 2014-11-09 DIAGNOSIS — J181 Lobar pneumonia, unspecified organism: Secondary | ICD-10-CM

## 2014-11-09 DIAGNOSIS — G9331 Postviral fatigue syndrome: Secondary | ICD-10-CM

## 2014-11-09 LAB — POCT CBC
GRANULOCYTE PERCENT: 78.5 % (ref 37–80)
HEMATOCRIT: 38.4 % (ref 37.7–47.9)
Hemoglobin: 12.5 g/dL (ref 12.2–16.2)
Lymph, poc: 1.2 (ref 0.6–3.4)
MCH: 28.8 pg (ref 27–31.2)
MCHC: 32.6 g/dL (ref 31.8–35.4)
MCV: 88.3 fL (ref 80–97)
MID (CBC): 0.5 (ref 0–0.9)
MPV: 7 fL (ref 0–99.8)
PLATELET COUNT, POC: 373 10*3/uL (ref 142–424)
POC Granulocyte: 6.3 (ref 2–6.9)
POC LYMPH PERCENT: 14.7 %L (ref 10–50)
POC MID %: 6.8 %M (ref 0–12)
RBC: 4.35 M/uL (ref 4.04–5.48)
RDW, POC: 13 %
WBC: 8 10*3/uL (ref 4.6–10.2)

## 2014-11-09 MED ORDER — ALBUTEROL SULFATE (2.5 MG/3ML) 0.083% IN NEBU
2.5000 mg | INHALATION_SOLUTION | Freq: Once | RESPIRATORY_TRACT | Status: AC
  Administered 2014-11-09: 2.5 mg via RESPIRATORY_TRACT

## 2014-11-09 MED ORDER — LEVOFLOXACIN 750 MG PO TABS: 750.0000 mg | ORAL_TABLET | Freq: Every day | ORAL | Status: DC

## 2014-11-09 MED ORDER — BENZONATATE 100 MG PO CAPS: 100.0000 mg | ORAL_CAPSULE | Freq: Three times a day (TID) | ORAL | Status: DC | PRN

## 2014-11-09 MED ORDER — HYDROCODONE-HOMATROPINE 5-1.5 MG/5ML PO SYRP: 5.0000 mL | ORAL_SOLUTION | ORAL | Status: DC | PRN

## 2014-11-09 NOTE — Progress Notes (Addendum)
Subjective:  Patient ID: Tamara [name], female    DOB: [birth year]  Age: 39 y.o. MRN: [id number]  39 year old lady who is here with a history of about 3 weeks of a respiratory tract infection. It started with a common cold type symptoms with head congestion and rhinorrhea. Then it settled more into her chest. It seemed to be doing better but over the last few days a week she's gotten worse. She is coughing day and night. Her yellow phlegm that she was coughing up is less now, but the cough continues to persist. At times she goes into paroxysms of coughing where she can hardly get her breath or cough. She does not have a history of a lot of respiratory tract infections. She does not smoke. 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 She is married with 2 children. No one else in the home is sick at this time. She has had her tubes tied, and is not pregnant. She has not been running fevers, though she may have at the very start of the illness. Last night she did have a temperature of 99.5, sweated and then it broke. She is not blowing a lot of her nose now. She is taken Mucinex and some other OTC medications but symptoms persist.  Uses topical medicines for psoriasis.   Objective:   Alert oriented healthy-appearing lady who is constantly coughing. She coughs hard enough that it brings tears. Her TMs are normal. Throat clear. No postnasal drainage is noted. Without significant nodes. Chest is fairly clear to auscultation, except for is tight on forced expiration with a prolonged expiratory phase. She does not have any pronounced wheezing. No rales or rhonchi. Heart regular without murmurs. No ankle edema.  PeAk flow 325  Results for orders placed or performed in visit on 11/09/14  POCT CBC  Result Value Ref Range   WBC 8.0 4.6 - 10.2 K/uL   Lymph, poc 1.2 0.6 - 3.4   POC LYMPH PERCENT 14.7 10 - 50 %L   MID (cbc) 0.5 0 - 0.9   POC MID % 6.8 0 - 12 %M   POC  Granulocyte 6.3 2 - 6.9   Granulocyte percent 78.5 37 - 80 %G   RBC 4.35 4.04 - 5.48 M/uL   Hemoglobin 12.5 12.2 - 16.2 g/dL   HCT, POC 38.4 37.7 - 47.9 %   MCV 88.3 80 - 97 fL   MCH, POC 28.8 27 - 31.2 pg   MCHC 32.6 31.8 - 35.4 g/dL   RDW, POC 13.0 %   Platelet Count, POC 373 142 - 424 K/uL   MPV 7.0 0 - 99.8 fL     Assessment & Plan:   Assessment: Postviral cough syndrome  [facility] reading (PRIMARY) by  Dr. Linna Darner RLL infiltrate consistent with pneumonia  DX: RLL Pneumonia, CAP  Plan: We'll attempt a treatment with albuterol nebulizer and see if that helps. Check CBC and chest x-ray on her.  Did not get much response from the nebulizer treatment.  Will treat for right lower lobe pneumonia Patient Instructions  Drink plenty of fluids  Take Levaquin 750 mg one daily for infection  Take benzonatate cough pills one or 2 pills 3 times daily as needed for daytime cough  Take Hycodan cough syrup 1 teaspoon every 4 hours as needed for nighttime cough or when you can afford to be drowsy.  If not significantly improving over the next 3 days return for a recheck. Return  or go to the emergency room at any time if acutely worse with more shortness of breath.   Patient was put on levaquin rather than ceftriaxone and azithromycin since she was scared of arrhythmias from azithromycin.  She called back later worried about risks of levaquiin, but after discussion proceeded with levaquin.  Zakry Caso, MD 11/09/2014

## 2014-11-09 NOTE — Patient Instructions (Signed)
Drink plenty of fluids  Take Levaquin 750 mg one daily for infection  Take benzonatate cough pills one or 2 pills 3 times daily as needed for daytime cough  Take Hycodan cough syrup 1 teaspoon every 4 hours as needed for nighttime cough or when you can afford to be drowsy.  If not significantly improving over the next 3 days return for a recheck. Return or go to the emergency room at any time if acutely worse with more shortness of breath.

## 2014-11-10 ENCOUNTER — Telehealth: Payer: Self-pay

## 2014-11-10 MED ORDER — AMOXICILLIN-POT CLAVULANATE 875-125 MG PO TABS: 1.0000 | ORAL_TABLET | Freq: Two times a day (BID) | ORAL | Status: DC

## 2014-11-10 NOTE — Telephone Encounter (Signed)
Spoke with patient and did not like side effects of levaquin. Advised that amoxicillin is not appropriate for CAP and that Augmentin is inferior to current med. Advised of Augmentin side effects and should take with food. If not improvement of sx should return to clinic.

## 2014-11-10 NOTE — Telephone Encounter (Signed)
Patient wants to switch from levofloxacin (LEVAQUIN) 750 MG tablet   To [place]  [phone]

## 2014-11-10 NOTE — Telephone Encounter (Signed)
Spoke with pt, she states she is concerned about the side effects. She had insomnia last night. She read the side effects on the label and feels it has too many. Can we switch medications for pt.

## 2014-11-16 ENCOUNTER — Telehealth: Payer: Self-pay

## 2014-11-16 NOTE — Telephone Encounter (Signed)
States that she feels better, however, productive cough is still present and would like to know if that is normal. Advised that it is normal and she should continue antibiotic until completion. Pt states that tessalon is not working, but Insurance risk surveyor is. Advised to take OTC delsym for cough instead and d/c tessalon. Should get plenty of water and rest.

## 2014-11-16 NOTE — Telephone Encounter (Signed)
Pt states she is on day 7 of her medication and still have a cough, didn't know if that was normal, pt was advised to come back in for a recheck but stated she wouldn't be able to Until this evening. Please call [phone]

## 2014-11-17 ENCOUNTER — Telehealth: Payer: Self-pay | Admitting: Family Medicine

## 2014-11-17 ENCOUNTER — Ambulatory Visit (INDEPENDENT_AMBULATORY_CARE_PROVIDER_SITE_OTHER): Payer: BC Managed Care – PPO | Admitting: Family Medicine

## 2014-11-17 ENCOUNTER — Ambulatory Visit (INDEPENDENT_AMBULATORY_CARE_PROVIDER_SITE_OTHER): Payer: BC Managed Care – PPO

## 2014-11-17 VITALS — BP 112/64 | HR 85 | Temp 98.2°F | Resp 18 | Ht 65.5 in | Wt 160.0 lb

## 2014-11-17 DIAGNOSIS — J181 Lobar pneumonia, unspecified organism: Principal | ICD-10-CM

## 2014-11-17 DIAGNOSIS — R509 Fever, unspecified: Secondary | ICD-10-CM

## 2014-11-17 DIAGNOSIS — R05 Cough: Secondary | ICD-10-CM

## 2014-11-17 DIAGNOSIS — J189 Pneumonia, unspecified organism: Secondary | ICD-10-CM

## 2014-11-17 DIAGNOSIS — R059 Cough, unspecified: Secondary | ICD-10-CM

## 2014-11-17 LAB — POCT CBC
GRANULOCYTE PERCENT: 80 % (ref 37–80)
HEMATOCRIT: 40 % (ref 37.7–47.9)
Hemoglobin: 13.2 g/dL (ref 12.2–16.2)
Lymph, poc: 1.2 (ref 0.6–3.4)
MCH, POC: 29 pg (ref 27–31.2)
MCHC: 32.9 g/dL (ref 31.8–35.4)
MCV: 88 fL (ref 80–97)
MID (cbc): 0.7 (ref 0–0.9)
MPV: 6.9 fL (ref 0–99.8)
POC Granulocyte: 7.7 — AB (ref 2–6.9)
POC LYMPH PERCENT: 12.9 %L (ref 10–50)
POC MID %: 7.1 % (ref 0–12)
Platelet Count, POC: 385 10*3/uL (ref 142–424)
RBC: 4.55 M/uL (ref 4.04–5.48)
RDW, POC: 12.6 %
WBC: 9.6 10*3/uL (ref 4.6–10.2)

## 2014-11-17 MED ORDER — AZITHROMYCIN 250 MG PO TABS: ORAL_TABLET | ORAL | Status: DC

## 2014-11-17 MED ORDER — CEFUROXIME AXETIL 500 MG PO TABS: 500.0000 mg | ORAL_TABLET | Freq: Two times a day (BID) | ORAL | Status: DC

## 2014-11-17 NOTE — Progress Notes (Signed)
  Subjective:  Patient ID: Tamara [name], female    DOB: [birth year]  Age: 39 y.o. MRN: [id number]  Patient was treated last week for a RML pneumonia. She was started initially on Levaquin, only took one dose. The next day she called and got switched to Augmentin. She had taken the Levaquin at nighttime and it caused her difficulty sleeping. She had been uneasy about taking it from the start. She has been on that for a week of a ten-day course now. She continues to cough. Last night she developed some fever several, did not take the temperature. She still is coughing. The benzonatate did not seem to help a lot though the cough syrup did give some cough relief. She is a nonsmoker.   Objective:   Throat clear. Neck supple without significant nodes. Chest has some slight coarse breath sounds in the right lung lower area.  Assessment & Plan:   Assessment: RML Pneumonia, with persistent cough and recurrent fever  Plan: Chest x-ray and repeat CBC [facility] reading (PRIMARY) by  Dr. Linna Darner Increased density right middle lung as compared to one week ago  Results for orders placed or performed in visit on 11/17/14  POCT CBC  Result Value Ref Range   WBC 9.6 4.6 - 10.2 K/uL   Lymph, poc 1.2 0.6 - 3.4   POC LYMPH PERCENT 12.9 10 - 50 %L   MID (cbc) 0.7 0 - 0.9   POC MID % 7.1 0 - 12 %M   POC Granulocyte 7.7 (A) 2 - 6.9   Granulocyte percent 80.0 37 - 80 %G   RBC 4.55 4.04 - 5.48 M/uL   Hemoglobin 13.2 12.2 - 16.2 g/dL   HCT, POC 40.0 37.7 - 47.9 %   MCV 88.0 80 - 97 fL   MCH, POC 29.0 27 - 31.2 pg   MCHC 32.9 31.8 - 35.4 g/dL   RDW, POC 12.6 %   Platelet Count, POC 385 142 - 424 K/uL   MPV 6.9 0 - 99.8 fL   . The worsening of infiltrate despite taking Augmentin makes concerns for a more resistant organism. Based on up-to-date for outpatient treatment a combination of azithromycin plus cefuroxime is appropriate.    There are no Patient Instructions on file for this visit.   Shanin Szymanowski,  MD 11/17/2014

## 2014-11-17 NOTE — Patient Instructions (Signed)
Continue to drink plenty of fluids. Good hydration helps keep the secretions thinner.  Take azithromycin 250 mg 2 pills initially, then 1 daily for 4 days  Take Ceftin (cefuroxime) 500 mg 1 twice daily  Return if getting worse at any time  Return in one week (I am here Wednesday 8:30 until 5-, before 3) for recheck and one more follow-up chest x-ray to be certain that this is clearing. If you're getting worse or this is not clearing we will need to be referred to a pulmonologist.  You can take over-the-counter Mucinex which may help thin the secretions further.

## 2014-11-17 NOTE — Telephone Encounter (Signed)
Patient stated that she went to a cardiologist a couple of years ago not sure, could you find out the name of doctor, please advise

## 2014-11-18 ENCOUNTER — Telehealth: Payer: Self-pay

## 2014-11-18 NOTE — Telephone Encounter (Signed)
Pt would like a call from you

## 2014-11-18 NOTE — Telephone Encounter (Signed)
I called the pt and informed her according to her chart she was seen by Dr Acie Fredrickson in 2014.  She stated she wanted this information as she is being treated for pneumonia by an urgent care and the medication she is taking mentioned something about a low heart rate?  Message forwarded to Dr Maudie Mercury.

## 2014-11-18 NOTE — Telephone Encounter (Signed)
Patient is wheezing and wonders if she needs a medication for it.   Breathing out is when she hears the wheezing.   Was just here for pneumonia.  Saw Dr. Linna Darner and would like to talk with him.     [facility] on [address].    [phone]

## 2014-11-18 NOTE — Telephone Encounter (Signed)
Rx an inhaler perhaps?

## 2014-11-18 NOTE — Telephone Encounter (Signed)
I am not sure what her question is? If having side effect to abx, advise follow up appointment. I hope she is feeling better.

## 2014-11-19 NOTE — Telephone Encounter (Signed)
No long QT interval on cardiologists notes of review of EKG so likely should be fine.

## 2014-11-19 NOTE — Telephone Encounter (Signed)
Patient informed. 

## 2014-11-19 NOTE — Telephone Encounter (Signed)
Dr Kim-I called the pt and she stated she is taking a Z-pack and the instructions stated to let your physician know if you are taking this if you have a long QT interval and she thought her EKG would tell if she had this or not?

## 2014-11-24 ENCOUNTER — Ambulatory Visit (INDEPENDENT_AMBULATORY_CARE_PROVIDER_SITE_OTHER): Payer: BC Managed Care – PPO | Admitting: Family Medicine

## 2014-11-24 ENCOUNTER — Ambulatory Visit (INDEPENDENT_AMBULATORY_CARE_PROVIDER_SITE_OTHER): Payer: BC Managed Care – PPO

## 2014-11-24 VITALS — BP 122/74 | HR 65 | Temp 98.2°F | Resp 17 | Ht 65.5 in | Wt 161.0 lb

## 2014-11-24 DIAGNOSIS — J189 Pneumonia, unspecified organism: Secondary | ICD-10-CM

## 2014-11-24 DIAGNOSIS — J181 Lobar pneumonia, unspecified organism: Secondary | ICD-10-CM

## 2014-11-24 NOTE — Patient Instructions (Signed)
Return at anytime if worse  Although the chest x-ray is significantly better, I think we should probably do one final x-ray in 4-6 weeks to make sure it clears completely.

## 2014-11-24 NOTE — Progress Notes (Signed)
  Subjective:  Patient ID: Tamara [name], female    DOB: 1975/06/11  Age: 39 y.o. MRN: [id number]  39 year old lady who is here for follow-up with regard to her right middle lobe pneumonia. It had not responded to the first round of antibodies, but seems to be doing better now. She finally felt well enough to go shopping yesterday. She still coughs up a little bit of mucus. She says she sounds like she has a smoker's cough at times.   Objective:   No acute distress. Neck supple without nodes. Chest clear to auscultation. Does have a slight cough.  [facility] reading (PRIMARY) by  Dr. Linna Darner Chest x-ray shows partial clearing of right middle lobe pneumonia.   Assessment & Plan:   Assessment: Community-acquired right middle lobe pneumonia, slow resolution, organism unknown   Plan:  Recommend follow-up one more time in about 5 or 6 weeks. Sooner if worse Patient Instructions  Return at anytime if worse  Although the chest x-ray is significantly better, I think we should probably do one final x-ray in 4-6 weeks to make sure it clears completely.     Eunie Lawn, MD 11/24/2014

## 2014-11-25 NOTE — Telephone Encounter (Signed)
I missed this message but I have seen her since then.

## 2014-12-28 ENCOUNTER — Ambulatory Visit (INDEPENDENT_AMBULATORY_CARE_PROVIDER_SITE_OTHER): Payer: BC Managed Care – PPO | Admitting: Family Medicine

## 2014-12-28 ENCOUNTER — Ambulatory Visit (INDEPENDENT_AMBULATORY_CARE_PROVIDER_SITE_OTHER): Payer: BC Managed Care – PPO

## 2014-12-28 VITALS — BP 108/64 | HR 48 | Temp 98.2°F | Resp 16 | Ht 65.5 in | Wt 161.5 lb

## 2014-12-28 DIAGNOSIS — J189 Pneumonia, unspecified organism: Secondary | ICD-10-CM

## 2014-12-28 DIAGNOSIS — R0982 Postnasal drip: Secondary | ICD-10-CM

## 2014-12-28 DIAGNOSIS — R05 Cough: Secondary | ICD-10-CM

## 2014-12-28 DIAGNOSIS — J329 Chronic sinusitis, unspecified: Secondary | ICD-10-CM

## 2014-12-28 DIAGNOSIS — R059 Cough, unspecified: Secondary | ICD-10-CM

## 2014-12-28 NOTE — Patient Instructions (Signed)
Chest x-ray is now clear. No further follow-up needed.  Consider taking an anti-histamine for your postnasal drainage (Zyrtec, Claritin, Allegra, etc.)

## 2014-12-28 NOTE — Progress Notes (Signed)
  Subjective:  Patient ID: Tamara [name], female    DOB: [birth year]  Age: 39 y.o. MRN: [id number]  Patient had a pneumonia earlier this summer with a rather persistent infiltrate. She is here for a follow-up from that. She feels fairly normal except for having a slight cough occasionally that she relates to some possible postnasal type sinus drainage. She is not a smoker. 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 Objective:   No acute distress. Chest clear to auscultation.  Assessment & Plan:   Assessment:  Pneumonia follow-up Postnasal drainage/allergic Slight cough  Plan:  Chest x-ray  [facility] reading (PRIMARY) by  Dr. Linna Darner  Right lower lobe pneumonia resolved.    Patient Instructions  Chest x-ray is now clear. No further follow-up needed.  Consider taking an anti-histamine for your postnasal drainage (Zyrtec, Claritin, Allegra, etc.)     HOPPER,DAVID, MD 12/28/2014

## 2015-03-04 ENCOUNTER — Other Ambulatory Visit: Payer: Self-pay | Admitting: *Deleted

## 2015-03-04 DIAGNOSIS — I83891 Varicose veins of right lower extremities with other complications: Secondary | ICD-10-CM

## 2015-04-05 ENCOUNTER — Encounter: Payer: Self-pay | Admitting: Vascular Surgery

## 2015-04-05 ENCOUNTER — Other Ambulatory Visit: Payer: Self-pay | Admitting: *Deleted

## 2015-04-05 DIAGNOSIS — F411 Generalized anxiety disorder: Secondary | ICD-10-CM

## 2015-04-05 MED ORDER — LORAZEPAM 1 MG PO TABS: 1.0000 mg | ORAL_TABLET | Freq: Once | ORAL | Status: DC

## 2015-04-07 ENCOUNTER — Encounter: Payer: Self-pay | Admitting: Vascular Surgery

## 2015-04-07 ENCOUNTER — Ambulatory Visit (INDEPENDENT_AMBULATORY_CARE_PROVIDER_SITE_OTHER): Payer: BC Managed Care – PPO | Admitting: Vascular Surgery

## 2015-04-07 VITALS — BP 107/64 | HR 56 | Temp 98.3°F | Resp 16 | Ht 65.0 in | Wt 162.0 lb

## 2015-04-07 DIAGNOSIS — I83891 Varicose veins of right lower extremities with other complications: Secondary | ICD-10-CM

## 2015-04-07 HISTORY — PX: ENDOVENOUS ABLATION SAPHENOUS VEIN W/ LASER: SUR449

## 2015-04-07 NOTE — Progress Notes (Signed)
     Laser Ablation Procedure    Date: 123456   Raksha Najafi [id number]  Consent signed: Yes    Surgeon:  Dr. Sherren Mocha Andelyn Spade  Procedure: Laser Ablation: right Greater Saphenous Vein  BP 107/64 mmHg  Pulse 56  Temp(Src) 98.3 F (36.8 C) (Oral)  Resp 16  Ht 5\' 5"  (1.651 m)  Wt 162 lb (73.483 kg)  BMI 26.96 kg/m2  SpO2 100%  Tumescent Anesthesia: 300 cc 0.9% NaCl with 50 cc Lidocaine HCL with 1% Epi and 15 cc 8.4% NaHCO3  Local Anesthesia: 3 cc Lidocaine HCL and NaHCO3 (ratio 2:1)  15 watts continuous mode        Total energy: 2074 Joules   Total time: 2:18      Patient tolerated procedure well  Notes: Mrs. Kender took Ativan 1 mg po 04-07-2015 at 9:15 AM.    Description of Procedure:  After marking the course of the secondary varicosities, the patient was placed on the operating table in the supine position, and the right leg was prepped and draped in sterile fashion.   Local anesthetic was administered and under ultrasound guidance the saphenous vein was accessed with a micro needle and guide wire; then the mirco puncture sheath was placed.  A guide wire was inserted saphenofemoral junction , followed by a 5 french sheath.  The position of the sheath and then the laser fiber below the junction was confirmed using the ultrasound.  Tumescent anesthesia was administered along the course of the saphenous vein using ultrasound guidance. The patient was placed in Trendelenburg position and protective laser glasses were placed on patient and staff, and the laser was fired at 15 watts continuous mode advancing 1-13mm/second for a total of 2074 joules.       Steri strips were applied and ABD pads and thigh high compression stockings were applied.  Ace wrap bandages were applied at the top of the saphenofemoral junction. Blood loss was less than 15 cc.  The patient ambulated out of the operating room having tolerated the procedure well.   uneventful ablation from the knee to  just below the saphenofemoral junction. Follow-up in 10 days

## 2015-04-08 ENCOUNTER — Telehealth: Payer: Self-pay | Admitting: *Deleted

## 2015-04-08 NOTE — Telephone Encounter (Signed)
    04/08/2015  Time: 9:22 AM   Patient Name: Tamara [name]  Patient of: T.F. Early  Procedure:Laser Ablation right greater saphenous vein 04-07-2015   Reached patient at home and checked  Her status  Yes    Comments/Actions Taken: [name] states she has moderate pain at top of her right thigh (area treated).  Encouraged her to take Ibuprofen 600 mg three times daily with meals to treat inflammation and to use cold compresses to right upper thigh as needed for pain relief. Reviewed post procedural instructions with her and reminded her of post laser ablation duplex and VV FU with Dr. Donnetta Hutching on 04-19-2015.      @SIGNATURE @

## 2015-04-11 ENCOUNTER — Encounter: Payer: Self-pay | Admitting: Vascular Surgery

## 2015-04-12 ENCOUNTER — Telehealth: Payer: Self-pay | Admitting: *Deleted

## 2015-04-12 ENCOUNTER — Ambulatory Visit (HOSPITAL_COMMUNITY)
Admission: RE | Admit: 2015-04-12 | Discharge: 2015-04-12 | Disposition: A | Discharge status: Home / Self Care | Payer: BC Managed Care – PPO | Source: Ambulatory Visit | Attending: Internal Medicine | Admitting: Internal Medicine

## 2015-04-12 ENCOUNTER — Ambulatory Visit (INDEPENDENT_AMBULATORY_CARE_PROVIDER_SITE_OTHER): Payer: BC Managed Care – PPO | Admitting: Internal Medicine

## 2015-04-12 ENCOUNTER — Encounter: Payer: Self-pay | Admitting: Vascular Surgery

## 2015-04-12 VITALS — BP 110/62 | HR 58 | Temp 98.7°F | Resp 18 | Ht 65.5 in | Wt 163.0 lb

## 2015-04-12 DIAGNOSIS — M79601 Pain in right arm: Secondary | ICD-10-CM

## 2015-04-12 DIAGNOSIS — Z9889 Other specified postprocedural states: Secondary | ICD-10-CM

## 2015-04-12 DIAGNOSIS — M79604 Pain in right leg: Secondary | ICD-10-CM

## 2015-04-12 DIAGNOSIS — M7989 Other specified soft tissue disorders: Secondary | ICD-10-CM | POA: Diagnosis not present

## 2015-04-12 DIAGNOSIS — J0100 Acute maxillary sinusitis, unspecified: Secondary | ICD-10-CM | POA: Diagnosis not present

## 2015-04-12 DIAGNOSIS — M79651 Pain in right thigh: Secondary | ICD-10-CM | POA: Insufficient documentation

## 2015-04-12 MED ORDER — AMOXICILLIN 875 MG PO TABS: 875.0000 mg | ORAL_TABLET | Freq: Two times a day (BID) | ORAL | Status: DC

## 2015-04-12 NOTE — Telephone Encounter (Signed)
DVT was negative and ablation area looked like it suppose to be.  Advised pt per Dr. Laney Pastor to continue the ibuprofen and heat and he will call tom.

## 2015-04-12 NOTE — Progress Notes (Signed)
VASCULAR LAB PRELIMINARY  PRELIMINARY  PRELIMINARY  PRELIMINARY  Right lower extremity venous duplex completed.    Preliminary report:  Right:  No evidence of DVT.  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  No propagation into the CFV.    Sharonna Vinje, RVT 04/12/2015, 7:40 PM

## 2015-04-12 NOTE — Progress Notes (Addendum)
Subjective:  This chart was scribed for Tami Lin, MD by Moises Blood, Medical Scribe. This patient was seen in Room 4 and the patient's care was started at 5:20 PM.    Patient ID: Tamara [name], female    DOB: 06-07-1975, 39 y.o.   MRN: [id number] Chief Complaint  Patient presents with  . Cough    yellow mucous since wednesday   . Nasal Congestion  . Sore Throat  . Leg Pain    rt leg pain after surgery     HPI Tamara [name] is a 39 y.o. female who presents to [facility] complaining of right leg pain after vascular surgery done on 5 days ago done at [facility]. It hurts to walk. She had laser ablation done over right greater saphenous vein because she had varicose veins. She describes the pain being sharp when she coughed or used any abd muscles. After she removed the dressing, she described the pain being dull. She does prolonged standing for work.   Illness She also has productive cough with nasal congestion and sore throat since 6 days ago. She has some popping in her ears. She denies coughs waking her up at night. Lots of purulent dr from nose in am Cough dry.   Patient Active Problem List   Diagnosis Date Noted  . RML pneumonia 11/24/2014  . Varicose veins of leg with complications 123XX123  . Pain of right lower extremity 05/05/2014  . Swelling of limb 05/05/2014  . Unspecified constipation 01/05/2014  . RECTAL FISSURE - sees the gastroenterologist 07/23/2007  . PSORIASIS - sees Dr. Delman Cheadle in Dermatology 01/01/2007    Current outpatient prescriptions:  .  clobetasol ointment (TEMOVATE) [id number] %, Apply 1 application topically as needed., Disp: , Rfl:  .  Clobetasol Propionate (CLOBEX SPRAY) 0.05 % external spray, Apply topically 2 (two) times daily.  , Disp: , Rfl:  .  desonide (DESOWEN) 0.05 % cream, Apply topically 2 (two) times daily.  , Disp: , Rfl:  .  docusate sodium (COLACE) 100 MG capsule, Take 100 mg by mouth daily. , Disp: , Rfl:  .  psyllium (REGULOID) 0.52  G capsule, Take 0.52 g by mouth daily., Disp: , Rfl:  .  Efinaconazole 10 % SOLN, Apply 1 drop topically daily. (Patient not taking: Reported on 11/24/2014), Disp: 4 mL, Rfl: 2 .  LORazepam (ATIVAN) 1 MG tablet, Take 1 tablet (1 mg total) by mouth once. Take 1 tablet 60 minutes prior to the procedure. Bring second tablet with you to office. (Patient not taking: Reported on 04/12/2015), Disp: 2 tablet, Rfl: 0    Review of Systems  Constitutional: Negative for fever, fatigue and unexpected weight change.  HENT: Positive for congestion, sore throat and tinnitus.   Respiratory: Positive for cough. Negative for chest tightness, shortness of breath and wheezing.   Cardiovascular: Negative for chest pain, palpitations and leg swelling.  Gastrointestinal: Negative for abdominal pain and blood in stool.  Musculoskeletal: Positive for myalgias (right leg pain).  Neurological: Negative for dizziness, syncope, light-headedness and headaches.       Objective:   Physical Exam  Constitutional: She is oriented to person, place, and time. She appears well-developed and well-nourished. No distress.  HENT:  Head: Normocephalic and atraumatic.  Right Ear: External ear normal.  Left Ear: External ear normal.  Mouth/Throat: Oropharynx is clear and moist.  Purulent nasal d/c with boggy turbs Tender max areas to perc  Eyes: EOM are normal. Pupils are equal, round, and reactive to  light.  Neck: Neck supple.  Cardiovascular: Normal rate, regular rhythm and normal heart sounds.   No murmur heard. Pulmonary/Chest: Effort normal and breath sounds normal. No respiratory distress. She has no wheezes.  Musculoskeletal: Normal range of motion.  Right leg: Tender in gastrocnemius and right posterior medial thigh all the way to the groin, has ecchymosis along the vein posteriorally in the thigh, above the point of incision from her recent vein surgery; peripheral pulses intact, no edema; but right calf 1 cm larger than  left, right thigh 2cm larger than left Good fem pulse and no fem bruit  Lymphadenopathy:    She has no cervical adenopathy.  Neurological: She is alert and oriented to person, place, and time.  Skin: Skin is warm and dry.  Psychiatric: She has a normal mood and affect. Her behavior is normal.  Nursing note and vitals reviewed.   BP 110/62 mmHg  Pulse 58  Temp(Src) 98.7 F (37.1 C) (Oral)  Resp 18  Ht 5' 5.5" (1.664 m)  Wt 163 lb (73.936 kg)  BMI 26.70 kg/m2  SpO2 97%  LMP 03/20/2015     Assessment & Plan:  Leg pain, posterior, right  Right leg swelling Status post ablation of incompetent vein using laser Acute maxillary sinusitis, recurrence not specified ---to [facility] for Venous doppler to r/o clot a S-F junction ---Dr Oneida Alar aware  Maxillary Sinusitis w/ cough ---amox 875 bid 10d  By signing my name below, I, Moises Blood, attest that this documentation has been prepared under the direction and in the presence of Tami Lin, MD. Electronically Signed: Moises Blood, [place]. 04/12/2015 , 5:39 PM .  I have completed the patient encounter in its entirety as documented by the scribe, with editing by me where necessary. Johndaniel Catlin P. Laney Pastor, M.D.  Addendum Ultrasound showed no evidence of clot but just recently surgery She is reassured and will follow-up as planned with Dr. Donnetta Hutching

## 2015-04-12 NOTE — Telephone Encounter (Signed)
Mrs. Tamara [name] is s/p endovenous laser ablation right greater saphenous vein on 04-07-2015 by Curt Jews MD.  Mrs. Tamara [name] states she still has mild/moderate pain at top of her right inner thigh (area treated) and states she has moderate pain behind her right knee only when she is not wearing her compression hose.  She states when she is wearing her thigh high compression hose she does not have pain behind her right knee.  Mrs. Tamara [name] states she has no significant swelling or pain in her right foot or ankle.  She states that her right foot and ankle" have always been larger than my left."  Encouraged her to wear compression hose daily, elevate her right leg when sitting (she is returning to work today), use ice compress to right upper inner thigh and behind right knee as needed, and to continue Ibuprofen 600 mg three times daily with meals for several more days (until pain level improves).  Mrs. Tamara [name] verbalized understanding. Reminded Mrs. Tamara [name] of post LA duplex and VV follow up with Dr. Donnetta Hutching on 04-18-2016.

## 2015-04-12 NOTE — Patient Instructions (Signed)
GO OVER TO [facility] ON [address] TO THE 2ND FLOOR ADMITTING AND ADVISE THEM THAT YOU ARE THERE FOR A VENOUS DOPPLER.

## 2015-04-19 ENCOUNTER — Ambulatory Visit (INDEPENDENT_AMBULATORY_CARE_PROVIDER_SITE_OTHER): Payer: BC Managed Care – PPO | Admitting: Vascular Surgery

## 2015-04-19 ENCOUNTER — Encounter: Payer: Self-pay | Admitting: Vascular Surgery

## 2015-04-19 ENCOUNTER — Ambulatory Visit (HOSPITAL_COMMUNITY)
Admission: RE | Admit: 2015-04-19 | Discharge: 2015-04-19 | Disposition: A | Discharge status: Home / Self Care | Payer: BC Managed Care – PPO | Source: Ambulatory Visit | Attending: Vascular Surgery | Admitting: Vascular Surgery

## 2015-04-19 VITALS — BP 111/74 | HR 52 | Temp 98.4°F | Resp 18 | Ht 65.5 in | Wt 162.4 lb

## 2015-04-19 DIAGNOSIS — I83891 Varicose veins of right lower extremities with other complications: Secondary | ICD-10-CM | POA: Diagnosis present

## 2015-04-19 DIAGNOSIS — Z9889 Other specified postprocedural states: Secondary | ICD-10-CM

## 2015-04-19 NOTE — Progress Notes (Signed)
[facility] [place]  Patient name: Tamara [name] MRN: [id number] DOB: [birth year] Sex: female  REASON FOR VISIT:  Follow-up of great saphenous vein ablation on 123456  HPI: Tamara [name] is a 39 y.o. female  Presents today for follow-up of her great saphenous vein laser ablation. She reports more than the usual amount discomfort associated with this. She reports tenderness in the usual  Location of her medial thigh. Also has a more unusual sensation discomfort in the sense of swelling in her right knee. No swelling in her calf and foot.  Past Medical History  Diagnosis Date  . Psoriasis   . Anal fissure   . Varicose veins   . Internal hemorrhoids   . Chest tightness 07/22/2012  . Hyperplastic colon polyp     Family History  Problem Relation Age of Onset  . Heart disease Maternal Grandfather   . Stroke Maternal Grandfather   . Alcohol abuse Mother   . Hypertension Mother   . Alcohol abuse Father   . Other Father     varicose veins  . Varicose Veins Father   . Hypertension Maternal Grandmother   . Heart disease Paternal Grandmother     AAA-  After age 31    SOCIAL HISTORY: Social History  Substance Use Topics  . Smoking status: Never Smoker   . Smokeless tobacco: Never Used  . Alcohol Use: No    No Known Allergies  Current Outpatient Prescriptions  Medication Sig Dispense Refill  . amoxicillin (AMOXIL) 875 MG tablet Take 1 tablet (875 mg total) by mouth 2 (two) times daily. 20 tablet 0  . clobetasol ointment (TEMOVATE) [id number] % Apply 1 application topically as needed.    . Clobetasol Propionate (CLOBEX SPRAY) 0.05 % external spray Apply topically 2 (two) times daily.      Marland Kitchen desonide (DESOWEN) 0.05 % cream Apply topically 2 (two) times daily.      Marland Kitchen docusate sodium (COLACE) 100 MG capsule Take 100 mg by mouth daily.     . Efinaconazole 10 % SOLN Apply 1 drop topically daily. (Patient not taking: Reported on 04/19/2015) 4 mL 2  . LORazepam  (ATIVAN) 1 MG tablet Take 1 tablet (1 mg total) by mouth once. Take 1 tablet 60 minutes prior to the procedure. Bring second tablet with you to office. (Patient not taking: Reported on 04/19/2015) 2 tablet 0  . psyllium (REGULOID) 0.52 G capsule Take 0.52 g by mouth daily.     No current facility-administered medications for this visit.    REVIEW OF SYSTEMS:  [X]  denotes positive finding, [ ]  denotes negative finding Cardiac  Comments:  Chest pain or chest pressure:    Shortness of breath upon exertion:    Short of breath when lying flat:    Irregular heart rhythm:        Vascular    Pain in calf, thigh, or hip brought on by ambulation: x   Pain in feet at night that wakes you up from your sleep:     Blood clot in your veins:    Leg swelling:  x       Pulmonary    Oxygen at home:    Productive cough:     Wheezing:         Neurologic    Sudden weakness in arms or legs:     Sudden numbness in arms or legs:     Sudden onset of difficulty speaking or slurred speech:  Temporary loss of vision in one eye:     Problems with dizziness:         Gastrointestinal    Blood in stool:     Vomited blood:         Genitourinary    Burning when urinating:     Blood in urine:        Psychiatric    Major depression:         Hematologic    Bleeding problems:    Problems with blood clotting too easily:        Skin    Rashes or ulcers:        Constitutional    Fever or chills:      PHYSICAL EXAM: Filed Vitals:   04/19/15 1605  BP: 111/74  Pulse: 52  Temp: 98.4 F (36.9 C)  TempSrc: Oral  Resp: 18  Height: 5' 5.5" (1.664 m)  Weight: 162 lb 6.4 oz (73.664 kg)  SpO2: 98%    GENERAL: The patient is a well-nourished female, in no acute distress. The vital signs are documented above.  VASCULAR:  Palpable right dorsalis pedis pulse NEUROLOGIC: No focal weakness or paresthesias are detected. SKIN: There are no ulcers or rashes noted. Does have some mild bruising on the  medial aspect of her right thigh PSYCHIATRIC: The patient has a normal affect.  DATA:   duplex shows successful closure of her great saphenous vein from the distal insertion site just below her knee to the saphenofemoral junction with no DVT  MEDICAL ISSUES:  successful laser ablation of right great saphenous vein for venous hypertension. She is having more than the usual amount of postoperative soreness related to the ablation. Unclear as to the cause of her right knee discomfort. No swelling by physical exam. Encouraged her to continue her walking as much as possible. Explained that she does not need to limit her activities. She was concerned about the ongoing risk of DVT. I explained that the major risk of this would've been at the time of procedure and this is clearly have been ruled out. She will continue to increase her activities will see [place] again on as-needed basis  No Follow-up on file.   Curt Jews [facility] [place]: [phone]

## 2015-04-28 ENCOUNTER — Telehealth: Payer: Self-pay | Admitting: *Deleted

## 2015-04-28 NOTE — Telephone Encounter (Signed)
Mrs. Tamara [name] is s/p endovenous laser ablation right greater saphenous vein on 04-07-2015 by Tamara Jews MD.   She states that the bruising has now resolved but she still has pain right medial thigh (area treated) and experiences pain "when sitting down where the chair hits me."  She states that the right leg pain is slowly improving.  She states she still takes Ibuprofen in the afternoon or evening after work if in pain.  Advised her to elevate her right leg when on breaks or when at home, take Ibuprofen as directed with food for leg pain, use ice compress to affected leg area as needed, wear compression hose daily. Offered to schedule follow up visit with Dr. Donnetta [name].  Tamara [name] said she would wait one more week and schedule an appointment with Dr. Donnetta [name] if needed.  Encouraged her to call [facility] if symptoms worsened or if she had further questions or concerns.

## 2015-06-23 ENCOUNTER — Encounter: Payer: Self-pay | Admitting: Family Medicine

## 2015-06-23 ENCOUNTER — Ambulatory Visit (INDEPENDENT_AMBULATORY_CARE_PROVIDER_SITE_OTHER): Payer: BC Managed Care – PPO | Admitting: Family Medicine

## 2015-06-23 VITALS — BP 108/80 | HR 65 | Temp 98.1°F | Ht 65.5 in | Wt 167.0 lb

## 2015-06-23 DIAGNOSIS — K649 Unspecified hemorrhoids: Secondary | ICD-10-CM | POA: Diagnosis not present

## 2015-06-23 DIAGNOSIS — K602 Anal fissure, unspecified: Secondary | ICD-10-CM

## 2015-06-23 DIAGNOSIS — L290 Pruritus ani: Secondary | ICD-10-CM

## 2015-06-23 DIAGNOSIS — K5900 Constipation, unspecified: Secondary | ICD-10-CM | POA: Diagnosis not present

## 2015-06-23 MED ORDER — HYDROCORTISONE 2.5 % RE CREA: 1.0000 "application " | TOPICAL_CREAM | Freq: Two times a day (BID) | RECTAL | Status: DC

## 2015-06-23 NOTE — Progress Notes (Signed)
HPI:  Tamara [name] is a pleasant anxious 40 yo here for an acute visit for:  Perianal pruritis: -long hx of symptoms  -reports: perianal pruritis intermittently for many years along with chronic constipation - uses colace for this, thinks has anal fissure after straining and pain with recent BM; sees dermatologist for psoriasis but has not told them about this -denies: fevers, malaise, change in bowels, recent melena or hematochezia, shoe string stools -per review of chart saw GI last year for this and reported > 3 year hx of daily discomfort, per GI notes she had normal colonoscopy in 2012 for hematochezia with non-adenematous polyps and with mild hemorrhoids -per GI notes was treated for hygiene, pinworms, yeast and hemorrhoids and was advised to see a dermatologist if persisted  ROS: See pertinent positives and negatives per HPI.  Past Medical History  Diagnosis Date  . Psoriasis   . Anal fissure   . Varicose veins   . Internal hemorrhoids   . Chest tightness 07/22/2012  . Hyperplastic colon polyp     Past Surgical History  Procedure Laterality Date  . Mole removal    . Tubal ligation    . Wisdom tooth extraction    . Cesarean section      2 c sections  . Endovenous ablation saphenous vein w/ laser Right 04-07-2015    endovenous laser ablation Right GSV by Curt Jews MD    Family History  Problem Relation Age of Onset  . Heart disease Maternal Grandfather   . Stroke Maternal Grandfather   . Alcohol abuse Mother   . Hypertension Mother   . Alcohol abuse Father   . Other Father     varicose veins  . Varicose Veins Father   . Hypertension Maternal Grandmother   . Heart disease Paternal Grandmother     AAA-  After age 40    Social History   Social History  . Marital Status: Married    Spouse Name: N/A  . Number of Children: 2  . Years of Education: N/A   Occupational History  . speech language pathologist    Social History Main Topics  . Smoking status:  Never Smoker   . Smokeless tobacco: Never Used  . Alcohol Use: No  . Drug Use: No  . Sexual Activity: Yes   Other Topics Concern  . None   Social History Narrative   Work or School: Warehouse manager Situation: lives with husband and son and daughter and granmother      Spiritual Beliefs: Christian      Lifestyle: no regular CV exercise; diet is good              Current outpatient prescriptions:  .  clobetasol ointment (TEMOVATE) AB-123456789 %, Apply 1 application topically as needed., Disp: , Rfl:  .  Clobetasol Propionate (CLOBEX SPRAY) 0.05 % external spray, Apply topically 2 (two) times daily.  , Disp: , Rfl:  .  desonide (DESOWEN) 0.05 % cream, Apply topically 2 (two) times daily.  , Disp: , Rfl:  .  docusate sodium (COLACE) 100 MG capsule, Take 100 mg by mouth daily. , Disp: , Rfl:  .  psyllium (REGULOID) 0.52 G capsule, Take 0.52 g by mouth daily., Disp: , Rfl:  .  hydrocortisone (ANUSOL-HC) 2.5 % rectal cream, Place 1 application rectally 2 (two) times daily., Disp: 30 g, Rfl: 0  EXAM:  Filed Vitals:   06/23/15 1537  BP: 108/80  Pulse:  65  Temp: 98.1 F (36.7 C)    Body mass index is 27.36 kg/(m^2).  GENERAL: vitals reviewed and listed above, alert, oriented, appears well hydrated and in no acute distress  HEENT: atraumatic, conjunttiva clear, no obvious abnormalities on inspection of external nose and ears  NECK: no obvious masses on inspection  RECTAL: some fecal matter on perianal tissues, mild irritation of perianal skin, ? Small anal fissure healing at 12 O'clock, small internal hemorrhoids  MS: moves all extremities without noticeable abnormality  PSYCH: pleasant and cooperative, no obvious depression or anxiety  ASSESSMENT AND PLAN:  Discussed the following assessment and plan:  Hemorrhoids, unspecified hemorrhoid type  RECTAL FISSURE - sees the gastroenterologist  Constipation, unspecified constipation type  Perianal  pruritus  -we discussed possible serious and likely etiologies, workup and treatment, treatment risks and return precautions - etiology may be multifactorial -agree with eval with derm and may need biopsy at their discretion - she has appointment -in interim advised treatment of constipation to keep stools normal, topical yeast and steroid per instructions -advised follow up with GI if persistent symptoms and dermatologist unable to assist -Patient advised to return or notify a doctor immediately if symptoms worsen or persist or new concerns arise.  Patient Instructions  Appears needs CPE with pap? Does she see gyn? If not advise to schedule cpe with pap.  Use small amount of the anusol combined with clotrimazole yeast cream 1-2 times daily for 2 weeks, then use small amount of over the counter hydrocortisone with clotrimazole 1-2 times daily for 2 more weeks  Schedule an appointment with your dermatologist for evaluation in the next 1-2 months  Use fiber supplement such as metameucil or citracel every morning before breakfast for the long term prevention of constipation  Use mirilax 1-2 times daily for 5-7 days at the first signs of constipation  Follow up with your gastroenterologist if dermatologist is unable to provide answers and persistent symptoms in 1-2 months       KIM, HANNAH R.

## 2015-06-23 NOTE — Patient Instructions (Addendum)
Appears needs CPE with pap? Does she see gyn? If not advise to schedule cpe with pap.  Use small amount of the anusol combined with clotrimazole yeast cream 1-2 times daily for 2 weeks, then use small amount of over the counter hydrocortisone with clotrimazole 1-2 times daily for 2 more weeks  Schedule an appointment with your dermatologist for evaluation in the next 1-2 months  Use fiber supplement such as metameucil or citracel every morning before breakfast for the long term prevention of constipation  Use mirilax 1-2 times daily for 5-7 days at the first signs of constipation  Follow up with your gastroenterologist if dermatologist is unable to provide answers and persistent symptoms in 1-2 months

## 2015-06-23 NOTE — Progress Notes (Signed)
Pre visit review using our clinic review tool, if applicable. No additional management support is needed unless otherwise documented below in the visit note. 

## 2015-06-27 ENCOUNTER — Telehealth: Payer: Self-pay | Admitting: Family Medicine

## 2015-06-27 NOTE — Telephone Encounter (Signed)
Spoke with pt and gave her this information.  

## 2015-06-27 NOTE — Telephone Encounter (Signed)
Dr Kim-I called [facility] and spoke with Joelene Millin and she stated Anusol is now available by prescription only.

## 2015-06-27 NOTE — Telephone Encounter (Signed)
Pt call to say she can not find the following med ANUSOL. She is asking if this is the correct name.She said the pharmacy told her it was the same preparation H. People is asking if she should just get the preparation H Would like a call back

## 2015-06-27 NOTE — Telephone Encounter (Signed)
This is a prescription medication and it was sent to her pharmacy the day we saw her.

## 2015-09-16 LAB — HM PAP SMEAR

## 2015-09-19 ENCOUNTER — Encounter: Payer: Self-pay | Admitting: Family Medicine

## 2015-10-23 ENCOUNTER — Emergency Department (HOSPITAL_BASED_OUTPATIENT_CLINIC_OR_DEPARTMENT_OTHER): Payer: BC Managed Care – PPO

## 2015-10-23 ENCOUNTER — Emergency Department (HOSPITAL_BASED_OUTPATIENT_CLINIC_OR_DEPARTMENT_OTHER)
Admission: EM | Admit: 2015-10-23 | Discharge: 2015-10-23 | Disposition: A | Discharge status: Home / Self Care | Payer: BC Managed Care – PPO | Attending: Emergency Medicine | Admitting: Emergency Medicine

## 2015-10-23 ENCOUNTER — Encounter (HOSPITAL_BASED_OUTPATIENT_CLINIC_OR_DEPARTMENT_OTHER): Payer: Self-pay | Admitting: *Deleted

## 2015-10-23 DIAGNOSIS — R1011 Right upper quadrant pain: Secondary | ICD-10-CM | POA: Diagnosis not present

## 2015-10-23 DIAGNOSIS — Z792 Long term (current) use of antibiotics: Secondary | ICD-10-CM | POA: Diagnosis not present

## 2015-10-23 LAB — COMPREHENSIVE METABOLIC PANEL
ALK PHOS: 62 U/L (ref 38–126)
ALT: 25 U/L (ref 14–54)
ANION GAP: 7 (ref 5–15)
AST: 21 U/L (ref 15–41)
Albumin: 4.2 g/dL (ref 3.5–5.0)
BUN: 11 mg/dL (ref 6–20)
CALCIUM: 9.3 mg/dL (ref 8.9–10.3)
CHLORIDE: 104 mmol/L (ref 101–111)
CO2: 28 mmol/L (ref 22–32)
CREATININE: 0.71 mg/dL (ref 0.44–1.00)
Glucose, Bld: 98 mg/dL (ref 65–99)
Potassium: 3.7 mmol/L (ref 3.5–5.1)
SODIUM: 139 mmol/L (ref 135–145)
Total Bilirubin: 0.6 mg/dL (ref 0.3–1.2)
Total Protein: 7.4 g/dL (ref 6.5–8.1)

## 2015-10-23 LAB — URINALYSIS, ROUTINE W REFLEX MICROSCOPIC
Bilirubin Urine: NEGATIVE
Glucose, UA: NEGATIVE mg/dL
Hgb urine dipstick: NEGATIVE
KETONES UR: NEGATIVE mg/dL
LEUKOCYTES UA: NEGATIVE
NITRITE: NEGATIVE
PROTEIN: NEGATIVE mg/dL
Specific Gravity, Urine: 1.009 (ref 1.005–1.030)
pH: 6.5 (ref 5.0–8.0)

## 2015-10-23 LAB — CBC
HCT: 38.2 % (ref 36.0–46.0)
HEMOGLOBIN: 13.2 g/dL (ref 12.0–15.0)
MCH: 30.8 pg (ref 26.0–34.0)
MCHC: 34.6 g/dL (ref 30.0–36.0)
MCV: 89.3 fL (ref 78.0–100.0)
PLATELETS: 357 10*3/uL (ref 150–400)
RBC: 4.28 MIL/uL (ref 3.87–5.11)
RDW: 12.2 % (ref 11.5–15.5)
WBC: 9.1 10*3/uL (ref 4.0–10.5)

## 2015-10-23 LAB — PREGNANCY, URINE: PREG TEST UR: NEGATIVE

## 2015-10-23 LAB — LIPASE, BLOOD: LIPASE: 22 U/L (ref 11–51)

## 2015-10-23 MED ORDER — KETOROLAC TROMETHAMINE 30 MG/ML IJ SOLN: 30.0000 mg | Freq: Once | INTRAMUSCULAR | Status: DC

## 2015-10-23 MED ORDER — IOPAMIDOL (ISOVUE-300) INJECTION 61%
100.0000 mL | Freq: Once | INTRAVENOUS | Status: AC | PRN
  Administered 2015-10-23: 100 mL via INTRAVENOUS

## 2015-10-23 MED ORDER — MORPHINE SULFATE (PF) 4 MG/ML IV SOLN: 4.0000 mg | Freq: Once | INTRAVENOUS | Status: DC

## 2015-10-23 MED ORDER — SODIUM CHLORIDE 0.9 % IV BOLUS (SEPSIS)
1000.0000 mL | Freq: Once | INTRAVENOUS | Status: AC
  Administered 2015-10-23: 1000 mL via INTRAVENOUS

## 2015-10-23 NOTE — ED Notes (Signed)
Patient transported to CT 

## 2015-10-23 NOTE — Discharge Instructions (Signed)
Your evaluation today did not reveal any acute cause for your right-sided abdominal pain. Your lab work is unremarkable.  Your CT scan did show a nonspecific low-density thickening of the endometrial canal. This can be further evaluated with a pelvic ultrasound in the outpatient setting. Therefore, it is important that you call your gynecologist to drill this appointment. You may take Advil or ibuprofen or Tylenol for your pain. Please return to the emergency Department if your pain becomes more severe, you experience fever, nausea, vomiting, or your not able to keep fluids down.   Abdominal Pain, Adult Many things can cause abdominal pain. Usually, abdominal pain is not caused by a disease and will improve without treatment. It can often be observed and treated at home. Your health care provider will do a physical exam and possibly order blood tests and X-rays to help determine the seriousness of your pain. However, in many cases, more time must pass before a clear cause of the pain can be found. Before that point, your health care provider may not know if you need more testing or further treatment. HOME CARE INSTRUCTIONS Monitor your abdominal pain for any changes. The following actions may help to alleviate any discomfort you are experiencing:  Only take over-the-counter or prescription medicines as directed by your health care provider.  Do not take laxatives unless directed to do so by your health care provider.  Try a clear liquid diet (broth, tea, or water) as directed by your health care provider. Slowly move to a bland diet as tolerated. SEEK MEDICAL CARE IF:  You have unexplained abdominal pain.  You have abdominal pain associated with nausea or diarrhea.  You have pain when you urinate or have a bowel movement.  You experience abdominal pain that wakes you in the night.  You have abdominal pain that is worsened or improved by eating food.  You have abdominal pain that is worsened  with eating fatty foods.  You have a fever. SEEK IMMEDIATE MEDICAL CARE IF:  Your pain does not go away within 2 hours.  You keep throwing up (vomiting).  Your pain is felt only in portions of the abdomen, such as the right side or the left lower portion of the abdomen.  You pass bloody or black tarry stools. MAKE SURE YOU:  Understand these instructions.  Will watch your condition.  Will get help right away if you are not doing well or get worse.   This information is not intended to replace advice given to you by your health care provider. Make sure you discuss any questions you have with your health care provider.   Document Released: 02/14/2005 Document Revised: 01/26/2015 Document Reviewed: 01/14/2013 Elsevier Interactive Patient Education [employer].

## 2015-10-23 NOTE — ED Notes (Signed)
Pt given d/c instructions as per chart. Verbalizes understanding. No questions. 

## 2015-10-23 NOTE — ED Provider Notes (Signed)
CSN: [id number]     Arrival date & time 10/23/15  1647 History   First MD Initiated Contact with Patient 10/23/15 1746     Chief Complaint  Patient presents with  . Abdominal Pain     (Consider location/radiation/quality/duration/timing/severity/associated sxs/prior Treatment) HPI Tamara [name] is a 40 y.o. female with PMH significant for psoriasis, anal fissure, hemorrhoids who presents with gradual onset, intermittent, dull, aching, right sided abdominal pain since 8 AM this morning.  Worse with movement and palpation.  No modifying factors.  No prior treatment.  No associated symptoms.  Denies fever, chills, N/V/D, melena, hematochezia, urinary symptoms, or vaginal complaints.  Last BM yesterday, normal.  She is currently taking Amoxicillin for bronchitis and ear infection.  Past Medical History  Diagnosis Date  . Psoriasis   . Anal fissure   . Varicose veins   . Internal hemorrhoids   . Chest tightness 07/22/2012  . Hyperplastic colon polyp    Past Surgical History  Procedure Laterality Date  . Mole removal    . Tubal ligation    . Wisdom tooth extraction    . Cesarean section      2 c sections  . Endovenous ablation saphenous vein w/ laser Right 04-07-2015    endovenous laser ablation Right [facility] by Curt Jews MD   Family History  Problem Relation Age of Onset  . Heart disease Maternal Grandfather   . Stroke Maternal Grandfather   . Alcohol abuse Mother   . Hypertension Mother   . Alcohol abuse Father   . Other Father     varicose veins  . Varicose Veins Father   . Hypertension Maternal Grandmother   . Heart disease Paternal Grandmother     AAA-  After age 10   Social History  Substance Use Topics  . Smoking status: Never Smoker   . Smokeless tobacco: Never Used  . Alcohol Use: No   OB History    No data available     Review of Systems All other systems negative unless otherwise stated in HPI    Allergies  Review of patient's allergies indicates no known  allergies.  Home Medications   Prior to Admission medications   Medication Sig Start Date End Date Taking? Authorizing Provider  amoxicillin (AMOXIL) 250 MG capsule Take 250 mg by mouth 3 (three) times daily.   Yes Historical Provider, MD  clobetasol ointment (TEMOVATE) [id number] % Apply 1 application topically as needed.    Historical Provider, MD  Clobetasol Propionate (CLOBEX SPRAY) 0.05 % external spray Apply topically 2 (two) times daily.      Historical Provider, MD  desonide (DESOWEN) 0.05 % cream Apply topically 2 (two) times daily.      Historical Provider, MD  docusate sodium (COLACE) 100 MG capsule Take 100 mg by mouth daily.     Historical Provider, MD  hydrocortisone (ANUSOL-HC) 2.5 % rectal cream Place 1 application rectally 2 (two) times daily. 06/23/15   Lucretia Kern, DO  psyllium (REGULOID) 0.52 G capsule Take 0.52 g by mouth daily.    Historical Provider, MD   BP 101/60 mmHg  Pulse 50  Temp(Src) 98.2 F (36.8 C) (Oral)  Resp 18  Ht 5' 5.5" (1.664 m)  Wt 73.936 kg  BMI 26.70 kg/m2  SpO2 96% Physical Exam  Constitutional: She is oriented to person, place, and time. She appears well-developed and well-nourished.  Non-toxic appearance. She does not have a sickly appearance. She does not appear ill.  HENT:  Head:  Normocephalic and atraumatic.  Mouth/Throat: Oropharynx is clear and moist.  Eyes: Conjunctivae are normal. Pupils are equal, round, and reactive to light.  Neck: Normal range of motion. Neck supple.  Cardiovascular: Normal rate and regular rhythm.   Pulmonary/Chest: Effort normal and breath sounds normal. No accessory muscle usage or stridor. No respiratory distress. She has no wheezes. She has no rhonchi. She has no rales.  Abdominal: Soft. Bowel sounds are normal. She exhibits no distension. There is tenderness. There is no rebound, no guarding and no CVA tenderness.    Musculoskeletal: Normal range of motion.  Lymphadenopathy:    She has no cervical adenopathy.   Neurological: She is alert and oriented to person, place, and time.  Speech clear without dysarthria.  Skin: Skin is warm and dry.  Psychiatric: She has a normal mood and affect. Her behavior is normal.    ED Course  Procedures (including critical care time) Labs Review Labs Reviewed  LIPASE, BLOOD  COMPREHENSIVE METABOLIC PANEL  CBC  PREGNANCY, URINE  URINALYSIS, ROUTINE W REFLEX MICROSCOPIC (NOT AT [facility])    Imaging Review Ct Abdomen Pelvis W Contrast  10/23/2015  CLINICAL DATA:  40 year old with right lower quadrant abdominal pain and tenderness to palpation for 1 day. History of tubal ligation and C-section. No leukocytosis. EXAM: CT ABDOMEN AND PELVIS WITH CONTRAST TECHNIQUE: Multidetector CT imaging of the abdomen and pelvis was performed using the standard protocol following bolus administration of intravenous contrast. CONTRAST:  175mL ISOVUE-300 IOPAMIDOL (ISOVUE-300) INJECTION 61% COMPARISON:  Ultrasound 01/11/2014 FINDINGS: Lower chest: Clear lung bases. No significant pleural or pericardial effusion. Hepatobiliary: The liver is normal in density without focal abnormality. No evidence of gallstones, gallbladder wall thickening or biliary dilatation. Pancreas: Unremarkable. No pancreatic ductal dilatation or surrounding inflammatory changes. Spleen: Normal in size without focal abnormality. Adrenals/Urinary Tract: Both adrenal glands appear normal. The kidneys appear normal without evidence of urinary tract calculus, suspicious lesion or hydronephrosis. No bladder abnormalities are seen. Stomach/Bowel: No evidence of bowel wall thickening, distention or surrounding inflammatory change. The appendix appears normal. There is moderate stool throughout the colon. Vascular/Lymphatic: There are no enlarged abdominal or pelvic lymph nodes. No significant vascular findings are present. Reproductive: There is low-density thickening of the endometrial canal to 10 mm. The uterus otherwise appears  normal. No suspicious adnexal findings. Probable small collapsing left ovarian follicle. Trace free pelvic fluid. Other: Tiny umbilical hernia containing only fat. Musculoskeletal: No acute or significant osseous findings. IMPRESSION: 1. No clear acute findings. No evidence of appendicitis or bowel obstruction. 2. Nonspecific low-density thickening of the endometrial canal. Consider further evaluation with pelvic ultrasound, especially if the patient has vaginal bleeding or cervical motion tenderness. Electronically Signed   By: Richardean Sale M.D.   On: 10/23/2015 20:34   I have personally reviewed and evaluated these images and lab results as part of my medical decision-making.   EKG Interpretation None      MDM   Final diagnoses:  Right upper quadrant pain   Patient presents with 1 day history of right sided abdominal pain.  VSS, NAD.  No vaginal complaints.  Afebrile.  No urinary symptoms.  On exam, patient appears well, non-toxic, or septic appearing.  Heart RRR, lungs CTAB, abdomen soft with tenderness in RUQ.  No RLQ tenderness.  Patient given Toradol and IVF.  Labs without acute abnormalities.  CT abdomen pelvis obtained to further evaluate for urolithiasis vs colitis vs cholelithiasis.  CT showed no acute findings.  Nonspecific low-density thickening of  endometrial canal was observed.  Discussed these findings with the patient and advised her to follow up with her GYN for further evaluation with pelvic ultrasound.  Advised patient to take Advil or Tylenol for pain.  Discussed return precautions.  Patient agrees and acknowledges the above plan for discharge.      Gloriann Loan, PA-C 10/24/15 [place], MD 10/24/15 [phone]

## 2015-10-23 NOTE — ED Notes (Signed)
RLQ pain, tenderness on palpation x 1 day.  Denies N/V/D

## 2015-11-08 ENCOUNTER — Encounter: Payer: Self-pay | Admitting: Family Medicine

## 2015-11-08 ENCOUNTER — Ambulatory Visit (INDEPENDENT_AMBULATORY_CARE_PROVIDER_SITE_OTHER): Payer: BC Managed Care – PPO | Admitting: Family Medicine

## 2015-11-08 VITALS — BP 100/70 | HR 52 | Temp 98.5°F | Ht 65.25 in | Wt 166.7 lb

## 2015-11-08 DIAGNOSIS — Z0000 Encounter for general adult medical examination without abnormal findings: Secondary | ICD-10-CM

## 2015-11-08 LAB — LIPID PANEL
CHOL/HDL RATIO: 2
Cholesterol: 127 mg/dL (ref 0–200)
HDL: 52.9 mg/dL (ref >39.00)
LDL Cholesterol: 61 mg/dL (ref 0–99)
NONHDL: 74.45
Triglycerides: 68 mg/dL (ref 0.0–149.0)
VLDL: 13.6 mg/dL (ref 0.0–40.0)

## 2015-11-08 LAB — HEMOGLOBIN A1C: HEMOGLOBIN A1C: 5.4 % (ref 4.6–6.5)

## 2015-11-08 NOTE — Progress Notes (Signed)
Pre visit review using our clinic review tool, if applicable. No additional management support is needed unless otherwise documented below in the visit note. 

## 2015-11-08 NOTE — Patient Instructions (Addendum)
BEFORE YOU LEAVE: -labs -follow up yearly  Vit D3 [phone] IU daily  We recommend the following healthy lifestyle measures: - eat a healthy whole foods diet consisting of regular small meals composed of vegetables, fruits, beans, nuts, seeds, healthy meats such as white chicken and fish and whole grains.  - avoid sweets, white starchy foods, fried foods, fast food, processed foods, sodas, red meet and other fattening foods.  - get a least 150-300 minutes of aerobic exercise per week.   We have ordered labs or studies at this visit. It can take up to 1-2 weeks for results and processing. IF results require follow up or explanation, we will call you with instructions. Clinically stable results will be released to your [facility]. If you have not heard from [place] or cannot find your results in [facility] in 2 weeks please contact our office at [phone].  If you are not yet signed up for [facility], please consider signing up.

## 2015-11-08 NOTE — Progress Notes (Signed)
HPI:  Here for CPE:  -Concerns and/or follow up today: none  -Diet: variety of foods, balance and well rounded - "could be better"  -Exercise: just started back to exercising - going to the gym  -Taking folic acid, vitamin D or calcium: no  -Diabetes and Dyslipidemia Screening: FASTING  -Hx of HTN: no  -Vaccines: UTD  -pap history: with Joycelyn Rua, gyn - done in 08/2015 normal  -FDLMP: s/p ablation  -sexual activity: yes, female partner, no new partners  -wants STI testing (Hep C if born [birth year]): no  -FH breast, colon or ovarian ca: see FH Last mammogram: n/a Last colon cancer screening: n/a  Breast Ca Risk Assessment: -does breast exams and breast health with gyn  -Alcohol, Tobacco, drug use: see social history  Review of Systems - no fevers, unintentional weight loss, vision loss, hearing loss, chest pain, sob, hemoptysis, melena, hematochezia, hematuria, genital discharge, changing or concerning skin lesions, bleeding, bruising, loc, thoughts of self harm or SI -she has chronic mild constipation and intermittent anal pruritis - sees GI -has chronic mild bradycardia, eval with cardiology in the past -has chronic fatigue her whole life, requires 7+ hours of sleep chronically -sees dermatologist for regular skin exams  Past Medical History  Diagnosis Date  . Psoriasis   . Anal fissure   . Varicose veins   . Internal hemorrhoids   . Chest tightness 07/22/2012  . Hyperplastic colon polyp     Past Surgical History  Procedure Laterality Date  . Mole removal    . Tubal ligation    . Wisdom tooth extraction    . Cesarean section      2 c sections  . Endovenous ablation saphenous vein w/ laser Right 04-07-2015    endovenous laser ablation Right [facility] by Curt Jews MD    Family History  Problem Relation Age of Onset  . Heart disease Maternal Grandfather   . Stroke Maternal Grandfather   . Alcohol abuse Mother   . Hypertension Mother   . Alcohol abuse Father    . Other Father     varicose veins  . Varicose Veins Father   . Hypertension Maternal Grandmother   . Heart disease Paternal Grandmother     AAA-  After age 9    Social History   Social History  . Marital Status: Married    Spouse Name: N/A  . Number of Children: 2  . Years of Education: N/A   Occupational History  . speech language pathologist    Social History Main Topics  . Smoking status: Never Smoker   . Smokeless tobacco: Never Used  . Alcohol Use: No  . Drug Use: No  . Sexual Activity: Yes   Other Topics Concern  . None   Social History Narrative   Work or School: Warehouse manager Situation: lives with husband and son and daughter and granmother      Spiritual Beliefs: Christian      Lifestyle: no regular CV exercise; diet is good              Current outpatient prescriptions:  .  amoxicillin (AMOXIL) 250 MG capsule, Take 250 mg by mouth 3 (three) times daily., Disp: , Rfl:  .  clobetasol ointment (TEMOVATE) [id number] %, Apply 1 application topically as needed., Disp: , Rfl:  .  Clobetasol Propionate (CLOBEX SPRAY) 0.05 % external spray, Apply topically 2 (two) times daily.  , Disp: , Rfl:  .  desonide (DESOWEN) 0.05 % cream, Apply topically 2 (two) times daily.  , Disp: , Rfl:  .  docusate sodium (COLACE) 100 MG capsule, Take 100 mg by mouth daily. , Disp: , Rfl:  .  hydrocortisone (ANUSOL-HC) 2.5 % rectal cream, Place 1 application rectally 2 (two) times daily., Disp: 30 g, Rfl: 0 .  psyllium (REGULOID) 0.52 G capsule, Take 0.52 g by mouth daily., Disp: , Rfl:   EXAM:  Filed Vitals:   11/08/15 0838  BP: 100/70  Pulse: 52  Temp: 98.5 F (36.9 C)    GENERAL: vitals reviewed and listed below, alert, oriented, appears well hydrated and in no acute distress  HEENT: head atraumatic, PERRLA, normal appearance of eyes, ears, nose and mouth. moist mucus membranes.  NECK: supple, no masses or lymphadenopathy  LUNGS: clear to  auscultation bilaterally, no rales, rhonchi or wheeze  CV: HRRR, no peripheral edema or cyanosis, normal pedal pulses  BREAST: does with gyn  ABDOMEN: bowel sounds normal, soft, non tender to palpation, no masses, no rebound or guarding  GU: does with gyn  SKIN: no rash or abnormal lesions  MS: normal gait, moves all extremities normally  NEURO: CN II-XII grossly intact, normal muscle strength and sensation to light touch on extremities  PSYCH: normal affect, pleasant and cooperative  ASSESSMENT AND PLAN:  Discussed the following assessment and plan:  Visit for preventive health examination - Plan: Hemoglobin A1c, Lipid panel  -Discussed and advised all [place] preventive services health task force level A and B recommendations for age, sex and risks.  -Advised at least 150 minutes of exercise per week and a healthy diet low in saturated fats and sweets and consisting of fresh fruits and vegetables, lean meats such as fish and white chicken and whole grains.  -FASTING labs, studies and vaccines per orders this encounter  Orders Placed This Encounter  Procedures  . Hemoglobin A1c  . Lipid panel    Patient advised to return to clinic immediately if symptoms worsen or persist or new concerns.  Patient Instructions  BEFORE YOU LEAVE: -labs -follow up yearly  Vit D3 [phone] IU daily  We recommend the following healthy lifestyle measures: - eat a healthy whole foods diet consisting of regular small meals composed of vegetables, fruits, beans, nuts, seeds, healthy meats such as white chicken and fish and whole grains.  - avoid sweets, white starchy foods, fried foods, fast food, processed foods, sodas, red meet and other fattening foods.  - get a least 150-300 minutes of aerobic exercise per week.   We have ordered labs or studies at this visit. It can take up to 1-2 weeks for results and processing. IF results require follow up or explanation, we will call you with  instructions. Clinically stable results will be released to your [facility]. If you have not heard from [place] or cannot find your results in [facility] in 2 weeks please contact our office at [phone].  If you are not yet signed up for [facility], please consider signing up.           No Follow-up on file.  Colin Benton R.

## 2015-12-30 ENCOUNTER — Telehealth: Payer: Self-pay | Admitting: Gastroenterology

## 2015-12-30 NOTE — Telephone Encounter (Signed)
Dr Fuller Plan do you agree with the switch to Dr Silverio Decamp, she would like a female doctor?

## 2016-01-02 NOTE — Telephone Encounter (Signed)
Dr Silverio Decamp is this ok with you?

## 2016-01-02 NOTE — Telephone Encounter (Signed)
ok 

## 2016-01-02 NOTE — Telephone Encounter (Signed)
OK 

## 2016-01-02 NOTE — Telephone Encounter (Signed)
Beth see transfer request, ok'd per Dr Fuller Plan and Dr Silverio Decamp,

## 2016-01-03 NOTE — Telephone Encounter (Signed)
Appointment scheduled with Dr. Silverio Decamp for 03/14/16

## 2016-03-14 ENCOUNTER — Encounter (INDEPENDENT_AMBULATORY_CARE_PROVIDER_SITE_OTHER): Payer: Self-pay

## 2016-03-14 ENCOUNTER — Encounter: Payer: Self-pay | Admitting: Gastroenterology

## 2016-03-14 ENCOUNTER — Ambulatory Visit (INDEPENDENT_AMBULATORY_CARE_PROVIDER_SITE_OTHER): Payer: BC Managed Care – PPO | Admitting: Gastroenterology

## 2016-03-14 VITALS — BP 130/70 | HR 66 | Ht 65.0 in | Wt 163.0 lb

## 2016-03-14 DIAGNOSIS — K5902 Outlet dysfunction constipation: Secondary | ICD-10-CM | POA: Diagnosis not present

## 2016-03-14 DIAGNOSIS — K649 Unspecified hemorrhoids: Secondary | ICD-10-CM

## 2016-03-14 MED ORDER — LINACLOTIDE 145 MCG PO CAPS: 145.0000 ug | ORAL_CAPSULE | Freq: Every day | ORAL | 3 refills | Status: DC

## 2016-03-14 NOTE — Patient Instructions (Addendum)
We have sent Linzess to your pharmacy  Stop Miralax and Colace  Use Benefiber 1 tablespoon three times a day with meals  We have scheduled you for your 1 st hemorrhoidal banding on 06/04/2016 at 4pm   You have been scheduled to have an anorectal manometry at [facility] on 03/28/2016 at 10:30am. Please arrive 30 minutes prior to your appointment time for registration (1st floor of the hospital-admissions).  Please make certain to use 1 Fleets enema 2 hours prior to coming for your appointment. You can purchase Fleets enemas from the laxative section at your drug store. You should not eat anything during the two hours prior to the procedure. You may take regular medications with small sips of water at least 2 hours prior to the study.  Anorectal manometry is a test performed to evaluate patients with constipation or fecal incontinence. This test measures the pressures of the anal sphincter muscles, the sensation in the rectum, and the neural reflexes that are needed for normal bowel movements.  THE PROCEDURE The test takes approximately 30 minutes to 1 hour. You will be asked to change into a hospital gown. A technician or nurse will explain the procedure to you, take a brief health history, and answer any questions you may have. The patient then lies on his or her left side. A small, flexible tube, about the size of a thermometer, with a balloon at the end is inserted into the rectum. The catheter is connected to a machine that measures the pressure. During the test, the small balloon attached to the catheter may be inflated in the rectum to assess the normal reflex pathways. The nurse or technician may also ask the person to squeeze, relax, and push at various times. The anal sphincter muscle pressures are measured during each of these maneuvers. To squeeze, the patient tightens the sphincter muscles as if trying to prevent anything from coming out. To push or bear down, the patient strains  down as if trying to have a bowel movement.

## 2016-03-14 NOTE — Progress Notes (Signed)
Tamara [name]    [id number]    1975/12/17  Primary Care Physician:KIM, Nickola Major., DO  Referring Physician: Lucretia Kern, DO [place], [place] [address]  Chief complaint:  Anal itching, constipation  HPI:  40 year old female with chronic history of anal itching previously followed by Dr. Fuller Plan is here for follow-up visit. She states the itching is constant and has been present on an ongoing basis for the past 5 or 6 years and noticed that it became worse after she had the colonoscopy in 2012. Colonoscopy performed in March 2012 showed small internal hemorrhoids and 2 small hyperplastic sigmoid colon polyps. Her symptoms often increase at nighttime. Hemorrhoidal treatments including standard hemorrhoidal creams standard rectal care instructions have not improved her symptoms at all. She has h/o chronic constipation with irregular bowel habits and takes occasional colace and Miralax as needed. She also has occasional mild rectal pain with defecation. She's noted occasional trace of blood on the tissue paper when wiping on few occasions. Patient is interested in undergoing hemorrhoidal band ligation for hemorrhoidal disease   Outpatient Encounter Prescriptions as of 03/14/2016  Medication Sig  . clobetasol ointment (TEMOVATE) [id number] % Apply 1 application topically as needed.  . Clobetasol Propionate (CLOBEX SPRAY) 0.05 % external spray Apply topically 2 (two) times daily as needed.   . desonide (DESOWEN) 0.05 % cream Apply topically 2 (two) times daily as needed.   . docusate sodium (COLACE) 100 MG capsule Take 100 mg by mouth daily as needed.   . hydrocortisone (ANUSOL-HC) 2.5 % rectal cream Place 1 application rectally 2 (two) times daily as needed for hemorrhoids or itching.  . linaclotide (LINZESS) 145 MCG CAPS capsule Take 1 capsule (145 mcg total) by mouth daily before breakfast.  . [DISCONTINUED] amoxicillin (AMOXIL) 250 MG capsule Take 250 mg by mouth 3  (three) times daily.  . [DISCONTINUED] hydrocortisone (ANUSOL-HC) 2.5 % rectal cream Place 1 application rectally 2 (two) times daily. (Patient taking differently: Place 1 application rectally 2 (two) times daily as needed. )  . [DISCONTINUED] psyllium (REGULOID) 0.52 G capsule Take 0.52 g by mouth daily.   No facility-administered encounter medications on file as of 03/14/2016.     Allergies as of 03/14/2016  . (No Known Allergies)    Past Medical History:  Diagnosis Date  . Anal fissure   . Chest tightness 07/22/2012  . Hyperplastic colon polyp   . Internal hemorrhoids   . Psoriasis    sees dermatologist  . Sinus bradycardia   . Varicose veins     Past Surgical History:  Procedure Laterality Date  . CESAREAN SECTION     2 c sections  . ENDOVENOUS ABLATION SAPHENOUS VEIN W/ LASER Right 04-07-2015   endovenous laser ablation Right GSV by Curt Jews MD  . MOLE REMOVAL    . TUBAL LIGATION    . WISDOM TOOTH EXTRACTION      Family History  Problem Relation Age of Onset  . Heart disease Maternal Grandfather   . Stroke Maternal Grandfather   . Alcohol abuse Mother   . Hypertension Mother   . Alcohol abuse Father   . Other Father     varicose veins  . Varicose Veins Father   . Hypertension Maternal Grandmother   . Heart disease Paternal Grandmother     AAA-  After age 85  . Colon cancer Neg Hx     Social History   Social History  .  Marital status: Married    Spouse name: N/A  . Number of children: 2  . Years of education: N/A   Occupational History  . speech language pathologist [employer] History Main Topics  . Smoking status: Never Smoker  . Smokeless tobacco: Never Used  . Alcohol use No  . Drug use: No  . Sexual activity: Yes    Partners: Male   Other Topics Concern  . Not on file   Social History Narrative   ork or School: Psychologist, clinical      Home Situation: lives with husband and son and daughter and granmother       Spiritual Beliefs: Christian      Lifestyle: no regular CV exercise; diet is good               Review of systems: Review of Systems  Constitutional: Negative for fever and chills.  HENT: Negative.   positive for sinus problems Eyes: Negative for blurred vision.  Respiratory: Negative for cough, shortness of breath and wheezing.   Cardiovascular: Negative for chest pain and  positive for palpitations.  Gastrointestinal: as per HPI Genitourinary: Negative for dysuria, urgency, frequency and hematuria.  Musculoskeletal: Negative for myalgias, positive for back pain and joint pain.  Skin: Negative for itching and rash.  positive for psoriasis Neurological: Negative for tremors, focal weakness, seizures and loss of consciousness.  positive for dizziness Endo/Heme/Allergies: Positive for seasonal allergies.  Psychiatric/Behavioral: Negative for depression, suicidal ideas and hallucinations.  All other systems reviewed and are negative.   Physical Exam: Vitals:   03/14/16 0853  BP: 130/70  Pulse: 66   Body mass index is 27.12 kg/m. Gen:      No acute distress HEENT:  EOMI, sclera anicteric Neck:     No masses; no thyromegaly Lungs:    Clear to auscultation bilaterally; normal respiratory effort CV:         Regular rate and rhythm; no murmurs Abd:      + bowel sounds; soft, non-tender; no palpable masses, no distension Ext:    No edema; adequate peripheral perfusion Skin:      Warm and dry; no rash Neuro: alert and oriented x 3 Psych: normal mood and affect Rectal exam: Normal anal sphincter tone, no anal fissure or external hemorrhoids. 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 All questions answered. The patient agrees to proceed. Will schedule for hemorrhoidal band ligation for symptomatic hemorrhoids  25 minutes was spent face-to-face with the patient. Greater than 50% of the time used for counseling as well as treatment plan and follow-up. She had multiple questions which were answered to her satisfaction  K. Denzil Magnuson , MD [phone] Mon-Fri 8a-5p [phone] after 5p, weekends, holidays  CC: Lucretia Kern, DO

## 2016-03-30 ENCOUNTER — Encounter (HOSPITAL_COMMUNITY)
Admission: RE | Disposition: A | Discharge status: Home / Self Care | Payer: Self-pay | Source: Ambulatory Visit | Attending: Gastroenterology

## 2016-03-30 ENCOUNTER — Ambulatory Visit (HOSPITAL_COMMUNITY)
Admission: RE | Admit: 2016-03-30 | Discharge: 2016-03-30 | Disposition: A | Discharge status: Home / Self Care | Payer: BC Managed Care – PPO | Source: Ambulatory Visit | Attending: Gastroenterology | Admitting: Gastroenterology

## 2016-03-30 DIAGNOSIS — K5900 Constipation, unspecified: Secondary | ICD-10-CM | POA: Diagnosis not present

## 2016-03-30 HISTORY — PX: ANAL RECTAL MANOMETRY: SHX6358

## 2016-03-30 SURGERY — MANOMETRY, ANORECTAL

## 2016-03-30 NOTE — Progress Notes (Signed)
Anal Manometry done per protocol. Pt tolerated well without complication. Report to be sent to dr. Silverio Decamp.

## 2016-04-02 ENCOUNTER — Encounter (HOSPITAL_COMMUNITY): Payer: Self-pay | Admitting: Gastroenterology

## 2016-04-19 NOTE — Op Note (Signed)
Patient: Tamara [name], Tamara [name]   [id number] Gender: Female Procedure: anorectal manometry   DOB: 12-Sep-1975 Physician: Dr. Silverio Decamp   Height: 5 ft 5 in Operator: Tory Emerald RN   Weight:  Referring Physician:      Examination Date: 03/30/2016    Resting  Normal Squeeze  Normal  Mean Sphincter Pressure(rectal ref.)(mmHg) 71.5  Max. Sphincter Pressure(rectal ref.)(mmHg) 258.3   Max. Sphincter Pressure(rectal ref.)(mmHg) 77.9  Max. Sphincter Pressure(abs. ref.)(mmHg) 254.4 164-233  Mean Sphincter Pressure(abs. ref.)(mmHg) 72.4  Duration of sustained squeeze(sec) 11.3 26-31  Max. Sphincter Pressure(abs. ref.)(mmHg) 78.8 61-83     Length of HPZ(cm) 4.6 3.8-4.5     Length verge to center(cm) 1.2             Push(attempted defecation)  Normal Balloon Inflation  Normal  Residual Anal Pressure(abs. ref.)(mmHg) 85.7 39-55 RAIR Present present  Percent anal relaxation(%) -8  First sensation(cc) 40   Intrarectal pressure(mmHg) 30.8 34-55 Urge to defecate(cc) 80      Discomfort(cc) 100       Procedure    3D anorectal manometry catheter placed without difficulty after digital exam was performed.  Procedure performed without difficulty and with patient's full cooperation.  Catheter was removed and patient denied pain or discomfort.      Balloon expulsion test performed.  Patient able to expel balloon within     10 seconds       .   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

## 2016-05-15 ENCOUNTER — Telehealth: Payer: Self-pay

## 2016-05-15 ENCOUNTER — Other Ambulatory Visit: Payer: Self-pay

## 2016-05-15 DIAGNOSIS — Z1211 Encounter for screening for malignant neoplasm of colon: Secondary | ICD-10-CM

## 2016-05-15 NOTE — Telephone Encounter (Signed)
Left message to call back for her anal manometry results. Keep appointment on 06/04/16 with Dr Silverio Decamp for hem band also.

## 2016-05-15 NOTE — Telephone Encounter (Signed)
Advised 

## 2016-06-04 ENCOUNTER — Encounter: Payer: BC Managed Care – PPO | Admitting: Gastroenterology

## 2016-06-22 ENCOUNTER — Encounter: Payer: Self-pay | Admitting: Gastroenterology

## 2016-06-22 ENCOUNTER — Ambulatory Visit (INDEPENDENT_AMBULATORY_CARE_PROVIDER_SITE_OTHER): Payer: BC Managed Care – PPO | Admitting: Gastroenterology

## 2016-06-22 VITALS — BP 92/60 | HR 52 | Ht 65.5 in | Wt 170.0 lb

## 2016-06-22 DIAGNOSIS — K625 Hemorrhage of anus and rectum: Secondary | ICD-10-CM

## 2016-06-22 DIAGNOSIS — L290 Pruritus ani: Secondary | ICD-10-CM

## 2016-06-22 DIAGNOSIS — K6289 Other specified diseases of anus and rectum: Secondary | ICD-10-CM

## 2016-06-22 DIAGNOSIS — K641 Second degree hemorrhoids: Secondary | ICD-10-CM

## 2016-06-22 NOTE — Patient Instructions (Signed)

## 2016-06-22 NOTE — Progress Notes (Signed)
PROCEDURE NOTE: The patient presents with symptomatic grade II hemorrhoids, requesting rubber band ligation of his/her hemorrhoidal disease.  All risks, benefits and alternative forms of therapy were described and informed consent was obtained.  In the Left Lateral Decubitus position anoscopic examination revealed grade II hemorrhoids in the Right anterior and R posterior position(s).  The anorectum was pre-medicated with 0.125% Nitroglycerine and Recticare The decision was made to band the Right posterior internal hemorrhoid, and the [place] was used to perform band ligation without complication.  Digital anorectal examination was then performed to assure proper positioning of the band, and to adjust the banded tissue as required.  The patient was discharged home without pain or other issues.  Dietary and behavioral recommendations were given and along with follow-up instructions.     The following adjunctive treatments were recommended: Keep the peri anal area dry, ok to use barrier cream if needed  The patient will return in 2-4 weeks for  follow-up and possible additional banding as required. No complications were encountered and the patient tolerated the procedure well.  Damaris Hippo , MD [phone] Mon-Fri 8a-5p [phone] after 5p, weekends, holidays

## 2016-07-16 ENCOUNTER — Encounter: Payer: Self-pay | Admitting: Gastroenterology

## 2016-07-16 ENCOUNTER — Ambulatory Visit (INDEPENDENT_AMBULATORY_CARE_PROVIDER_SITE_OTHER): Payer: BC Managed Care – PPO | Admitting: Gastroenterology

## 2016-07-16 VITALS — BP 108/66 | HR 60 | Ht 64.75 in | Wt 168.0 lb

## 2016-07-16 DIAGNOSIS — K641 Second degree hemorrhoids: Secondary | ICD-10-CM | POA: Diagnosis not present

## 2016-07-16 NOTE — Progress Notes (Signed)
PROCEDURE NOTE: The patient presents with symptomatic grade II  hemorrhoids, requesting rubber band ligation of his/her hemorrhoidal disease.  All risks, benefits and alternative forms of therapy were described and informed consent was obtained.   The anorectum was pre-medicated with 0.125% Nitroglycerine and Recticare The decision was made to band the Right anterior internal hemorrhoid, and the [facility] System was used to perform band ligation without complication.  Digital anorectal examination was then performed to assure proper positioning of the band, and to adjust the banded tissue as required.  The patient was discharged home without pain or other issues.  Dietary and behavioral recommendations were given and along with follow-up instructions.      The patient will return in 2-4 weeks for  follow-up and possible additional banding as required. No complications were encountered and the patient tolerated the procedure well.  K. Veena Nandigam , MD [phone] Mon-Fri 8a-5p [phone] after 5p, weekends, holidays 

## 2016-07-16 NOTE — Patient Instructions (Signed)
Increase water intake  Take 1 tablespoon of benefiber three times a day  Your third banding has been scheduled for 09/05/2016 at 4:00pm  HEMORRHOID BANDING PROCEDURE    FOLLOW-UP CARE   1. The procedure you have had should have been relatively painless since the banding of the area involved does not have nerve endings and there is no pain sensation.  The rubber band cuts off the blood supply to the hemorrhoid and the band may fall off as soon as 48 hours after the banding (the band may occasionally be seen in the toilet bowl following a bowel movement). You may notice a temporary feeling of fullness in the rectum which should respond adequately to plain Tylenol or Motrin.  2. Following the banding, avoid strenuous exercise that evening and resume full activity the next day.  A sitz bath (soaking in a warm tub) or bidet is soothing, and can be useful for cleansing the area after bowel movements.     3. To avoid constipation, take two tablespoons of natural wheat bran, natural oat bran, flax, Benefiber or any over the counter fiber supplement and increase your water intake to 7-8 glasses daily.    4. Unless you have been prescribed anorectal medication, do not put anything inside your rectum for two weeks: No suppositories, enemas, fingers, etc.  5. Occasionally, you may have more bleeding than usual after the banding procedure.  This is often from the untreated hemorrhoids rather than the treated one.  Don't be concerned if there is a tablespoon or so of blood.  If there is more blood than this, lie flat with your bottom higher than your head and apply an ice pack to the area. If the bleeding does not stop within a half an hour or if you feel faint, call our office at [phone] or go to the emergency room.  6. Problems are not common; however, if there is a substantial amount of bleeding, severe pain, chills, fever or difficulty passing urine (very rare) or other problems, you should  call us at [phone] or report to the nearest emergency room.  7. Do not stay seated continuously for more than 2-3 hours for a day or two after the procedure.  Tighten your buttock muscles 10-15 times every two hours and take 10-15 deep breaths every 1-2 hours.  Do not spend more than a few minutes on the toilet if you cannot empty your bowel; instead re-visit the toilet at a later time.

## 2016-09-05 ENCOUNTER — Ambulatory Visit (INDEPENDENT_AMBULATORY_CARE_PROVIDER_SITE_OTHER): Payer: BC Managed Care – PPO | Admitting: Gastroenterology

## 2016-09-05 ENCOUNTER — Encounter: Payer: Self-pay | Admitting: Gastroenterology

## 2016-09-05 VITALS — BP 118/74 | Ht 65.5 in | Wt 168.2 lb

## 2016-09-05 DIAGNOSIS — L290 Pruritus ani: Secondary | ICD-10-CM | POA: Diagnosis not present

## 2016-09-05 DIAGNOSIS — K5900 Constipation, unspecified: Secondary | ICD-10-CM

## 2016-09-05 DIAGNOSIS — K641 Second degree hemorrhoids: Secondary | ICD-10-CM | POA: Diagnosis not present

## 2016-09-05 MED ORDER — LINACLOTIDE 290 MCG PO CAPS: 290.0000 ug | ORAL_CAPSULE | Freq: Every day | ORAL | 3 refills | Status: DC

## 2016-09-05 NOTE — Patient Instructions (Signed)
We will refer you to Geryl Rankins at [facility] and contact you with that appointment when it becomes available   We are giving you 290 mcg of Linzess samples to try today, if they work for you we will send you in a prescription     [place]   1. The procedure you have had should have been relatively painless since the banding of the area involved does not have nerve endings and there is no pain sensation.  The rubber band cuts off the blood supply to the hemorrhoid and the band may fall off as soon as 48 hours after the banding (the band may occasionally be seen in the toilet bowl following a bowel movement). You may notice a temporary feeling of fullness in the rectum which should respond adequately to plain Tylenol or Motrin.  2. Following the banding, avoid strenuous exercise that evening and resume full activity the next day.  A sitz bath (soaking in a warm tub) or bidet is soothing, and can be useful for cleansing the area after bowel movements.     3. To avoid constipation, take two tablespoons of natural wheat bran, natural oat bran, flax, Benefiber or any over the counter fiber supplement and increase your water intake to 7-8 glasses daily.    4. Unless you have been prescribed anorectal medication, do not put anything inside your rectum for two weeks: No suppositories, enemas, fingers, etc.  5. Occasionally, you may have more bleeding than usual after the banding procedure.  This is often from the untreated hemorrhoids rather than the treated one.  Don't be concerned if there is a tablespoon or so of blood.  If there is more blood than this, lie flat with your bottom higher than your head and apply an ice pack to the area. If the bleeding does not stop within a half an hour or if you feel faint, call our office at [phone] or go to the emergency room.  6. Problems are not common; however, if there is a substantial  amount of bleeding, severe pain, chills, fever or difficulty passing urine (very rare) or other problems, you should call us at [phone] or report to the nearest emergency room.  7. Do not stay seated continuously for more than 2-3 hours for a day or two after the procedure.  Tighten your buttock muscles 10-15 times every two hours and take 10-15 deep breaths every 1-2 hours.  Do not spend more than a few minutes on the toilet if you cannot empty your bowel; instead re-visit the toilet at a later time.

## 2016-09-05 NOTE — Progress Notes (Signed)
PROCEDURE NOTE: The patient presents with symptomatic grade II  hemorrhoids, requesting rubber band ligation of his/her hemorrhoidal disease.  All risks, benefits and alternative forms of therapy were described and informed consent was obtained.   The anorectum was pre-medicated with 0.125% Nitroglycerine and Recticare The decision was made to band the Left lateral internal hemorrhoid, and the [place] was used to perform band ligation without complication.  Digital anorectal examination was then performed to assure proper positioning of the band, and to adjust the banded tissue as required.  The patient was discharged home without pain or other issues.  Dietary and behavioral recommendations were given and along with follow-up instructions.     The following adjunctive treatments were recommended: Increase Linzess to 290 mcg daily Benefiber 1 tablespoon TID with meals   The patient will return for  follow-up and possible additional banding as required. No complications were encountered and the patient tolerated the procedure well.  Damaris Hippo , MD [phone] Mon-Fri 8a-5p [phone] after 5p, weekends, holidays

## 2016-09-07 ENCOUNTER — Telehealth: Payer: Self-pay | Admitting: *Deleted

## 2016-09-07 NOTE — Telephone Encounter (Signed)
Faxed in referral to [facility] to Tamara [name] today

## 2016-09-12 NOTE — Telephone Encounter (Signed)
Appointment with Leighton Ruff on 6/48/4720 at 10am  Pt aware

## 2016-10-01 ENCOUNTER — Telehealth: Payer: Self-pay | Admitting: Gastroenterology

## 2016-10-01 NOTE — Telephone Encounter (Signed)
This is the only referral I can find on the patient. I left this information in her voicemail. Oda Kilts, CMA      3:48 PM  Note    Appointment with Leighton Ruff on 12/29/5724 at 10am  Pt aware     September 07, 2016  Oda Kilts, [place]      10:17 AM  Note    Faxed in referral to [facility] to Leighton Ruff today

## 2016-11-07 NOTE — Progress Notes (Signed)
HPI:  Here for CPE:  -Concerns and/or follow up today: none  -Diet: variety of foods, balance and well rounded, could be better  -Exercise: no regular exercise  -Taking folic acid, vitamin D or calcium: no  -Diabetes and Dyslipidemia Screening: FASTING for labs, also wants to recheck thyroid as is always tired chronically and struggles with weight  -Hx of HTN: no  -Vaccines: UTD  -pap history: sees gyn, Joycelyn Rua, last pap 08/2015 normal  -wants STI testing (Hep C if born 50-65): no  -FH breast, colon or ovarian ca: see FH Last mammogram:sees gyn for breast exams Last colon cancer screening: n/a  Sees dermatologist for complete skin exams q6 months  -Alcohol, Tobacco, drug use: see social history  Review of Systems - no fevers, unintentional weight loss, vision loss, hearing loss, chest pain, sob, hemoptysis, melena, hematochezia, hematuria, genital discharge, changing or concerning skin lesions, bleeding, bruising, loc, thoughts of self harm or SI  Past Medical History:  Diagnosis Date  . Anal fissure   . Chest tightness 07/22/2012  . Constipation 01/05/2014  . Hyperplastic colon polyp   . Internal hemorrhoids   . Psoriasis    sees dermatologist  . Sinus bradycardia   . Varicose veins     Past Surgical History:  Procedure Laterality Date  . ANAL RECTAL MANOMETRY N/A 03/30/2016   Procedure: ANO RECTAL MANOMETRY;  Surgeon: Mauri Pole, MD;  Location: [facility];  Service: Endoscopy;  Laterality: N/A;  . CESAREAN SECTION     2 c sections  . ENDOVENOUS ABLATION SAPHENOUS VEIN W/ LASER Right 04-07-2015   endovenous laser ablation Right GSV by Curt Jews MD  . MOLE REMOVAL    . TUBAL LIGATION    . WISDOM TOOTH EXTRACTION      Family History  Problem Relation Age of Onset  . Heart disease Maternal Grandfather   . Stroke Maternal Grandfather   . Alcohol abuse Mother   . Hypertension Mother   . Alcohol abuse Father   . Varicose Veins Father   .  Hypertension Maternal Grandmother   . Heart disease Paternal Grandmother        AAA-  After age 57  . Colon cancer Neg Hx   . Stomach cancer Neg Hx     Social History   Social History  . Marital status: Married    Spouse name: N/A  . Number of children: 2  . Years of education: N/A   Occupational History  . speech language pathologist [employer] History Main Topics  . Smoking status: Never Smoker  . Smokeless tobacco: Never Used  . Alcohol use No  . Drug use: No  . Sexual activity: Yes    Partners: Male   Other Topics Concern  . None   Social History Narrative   ork or School: Psychologist, clinical      Home Situation: lives with husband and son and daughter and granmother      Spiritual Beliefs: Christian      Lifestyle: no regular CV exercise; diet is good              Current Outpatient Prescriptions:  .  clobetasol ointment (TEMOVATE) 3.26 %, Apply 1 application topically as needed., Disp: , Rfl:  .  Clobetasol Propionate (CLOBEX SPRAY) 0.05 % external spray, Apply topically 2 (two) times daily as needed. , Disp: , Rfl:  .  desonide (DESOWEN) 0.05 % cream, Apply topically 2 (two) times daily as  needed. , Disp: , Rfl:  .  docusate sodium (COLACE) 100 MG capsule, Take 100 mg by mouth daily as needed. , Disp: , Rfl:  .  hydrocortisone (ANUSOL-HC) 2.5 % rectal cream, Place 1 application rectally 2 (two) times daily as needed for hemorrhoids or itching., Disp: , Rfl:  .  linaclotide (LINZESS) 290 MCG CAPS capsule, Take 1 capsule (290 mcg total) by mouth daily before breakfast., Disp: 30 capsule, Rfl: 3  EXAM:  Vitals:   11/08/16 0807  BP: (!) 98/58  Pulse: 60  Temp: 97.7 F (36.5 C)  Body mass index is 27.56 kg/m.   GENERAL: vitals reviewed and listed below, alert, oriented, appears well hydrated and in no acute distress  HEENT: head atraumatic, PERRLA, normal appearance of eyes, ears, nose and mouth. moist mucus  membranes.  NECK: supple, no masses or lymphadenopathy  LUNGS: clear to auscultation bilaterally, no rales, rhonchi or wheeze  CV: HRRR, no peripheral edema or cyanosis, normal pedal pulses  ABDOMEN: bowel sounds normal, soft, non tender to palpation, no masses, no rebound or guarding  SKIN: she does skin checks with dermatologist and declines  GU/BREAST: declined, does with gyn  MS: normal gait, moves all extremities normally  NEURO: normal gait, speech and thought processing grossly intact, muscle tone grossly intact throughout  PSYCH: normal affect, pleasant and cooperative  ASSESSMENT AND PLAN:  Discussed the following assessment and plan:  Encounter for preventive health examination - Plan: Lipid panel, Hemoglobin A1c  BMI 27.0-27.9,adult - Plan: TSH  Chronic fatigue - Plan: TSH  -Discussed and advised all [place] preventive services health task force level A and B recommendations for age, sex and risks.  -Advised at least 150 minutes of exercise per week and a healthy diet with avoidance of (less then 1 serving per week) processed foods, white starches, red meat, fast foods and sweets and consisting of: * 5-9 servings of fresh fruits and vegetables (not corn or potatoes) *nuts and seeds, beans *olives and olive oil *lean meats such as fish and white chicken  *whole grains  -FASTING labs, studies and vaccines per orders this encounter  Orders Placed This Encounter  Procedures  . TSH  . Lipid panel  . Hemoglobin A1c    Patient advised to return to clinic immediately if symptoms worsen or persist or new concerns.  Patient Instructions  BEFORE YOU LEAVE: -follow up: yearly for physical -labs  Vit D3 [phone] IU daily - [employer] Vit D3 drops is one option  We have ordered labs or studies at this visit. It can take up to 1-2 weeks for results and processing. IF results require follow up or explanation, we will call you with instructions. Clinically stable  results will be released to your [facility]. If you have not heard from [place] or cannot find your results in [facility] in 2 weeks please contact our office at [phone].  If you are not yet signed up for [facility], please consider signing up.   Advise regular aerobic exercise (at least 150 minutes per week of sweaty exercise) and a healthy diet. Try to eat at least 5-9 servings of vegetables and fruits per day (not corn, potatoes or bananas.) Avoid sweets, red meat, pork, butter, fried foods, fast food, processed food, excessive dairy, eggs and coconut. Replace bad fats with good fats - fish, nuts and seeds, canola oil, olive oil.     Health Maintenance, Female Adopting a healthy lifestyle and getting preventive care can go a long way to promote  health and wellness. Talk with your health care provider about what schedule of regular examinations is right for you. This is a good chance for you to check in with your provider about disease prevention and staying healthy. In between checkups, there are plenty of things you can do on your own. Experts have done a lot of research about which lifestyle changes and preventive measures are most likely to keep you healthy. Ask your health care provider for more information. Weight and diet Eat a healthy diet  Be sure to include plenty of vegetables, fruits, low-fat dairy products, and lean protein.  Do not eat a lot of foods high in solid fats, added sugars, or salt.  Get regular exercise. This is one of the most important things you can do for your health. ? Most adults should exercise for at least 150 minutes each week. The exercise should increase your heart rate and make you sweat (moderate-intensity exercise). ? Most adults should also do strengthening exercises at least twice a week. This is in addition to the moderate-intensity exercise.  Maintain a healthy weight  Body mass index (BMI) is a measurement that can be used to identify possible weight  problems. It estimates body fat based on height and weight. Your health care provider can help determine your BMI and help you achieve or maintain a healthy weight.  For females 76 years of age and older: ? A BMI below 18.5 is considered underweight. ? A BMI of 18.5 to 24.9 is normal. ? A BMI of 25 to 29.9 is considered overweight. ? A BMI of 30 and above is considered obese.  Watch levels of cholesterol and blood lipids  You should start having your blood tested for lipids and cholesterol at 39 years of age, then have this test every 5 years.  You may need to have your cholesterol levels checked more often if: ? Your lipid or cholesterol levels are high. ? You are older than 39 years of age. ? You are at high risk for heart disease.  Cancer screening Lung Cancer  Lung cancer screening is recommended for adults 45-67 years old who are at high risk for lung cancer because of a history of smoking.  A yearly low-dose CT scan of the lungs is recommended for people who: ? Currently smoke. ? Have quit within the past 15 years. ? Have at least a 30-pack-year history of smoking. A pack year is smoking an average of one pack of cigarettes a day for 1 year.  Yearly screening should continue until it has been 15 years since you quit.  Yearly screening should stop if you develop a health problem that would prevent you from having lung cancer treatment.  Breast Cancer  Practice breast self-awareness. This means understanding how your breasts normally appear and feel.  It also means doing regular breast self-exams. Let your health care provider know about any changes, no matter how small.  If you are in your 20s or 30s, you should have a clinical breast exam (CBE) by a health care provider every 1-3 years as part of a regular health exam.  If you are 19 or older, have a CBE every year. Also consider having a breast X-ray (mammogram) every year.  If you have a family history of breast  cancer, talk to your health care provider about genetic screening.  If you are at high risk for breast cancer, talk to your health care provider about having an MRI and a mammogram every  year.  Breast cancer gene (BRCA) assessment is recommended for women who have family members with BRCA-related cancers. BRCA-related cancers include: ? Breast. ? Ovarian. ? Tubal. ? Peritoneal cancers.  Results of the assessment will determine the need for genetic counseling and BRCA1 and BRCA2 testing.  Cervical Cancer Your health care provider may recommend that you be screened regularly for cancer of the pelvic organs (ovaries, uterus, and vagina). This screening involves a pelvic examination, including checking for microscopic changes to the surface of your cervix (Pap test). You may be encouraged to have this screening done every 3 years, beginning at age 29.  For women ages 21-65, health care providers may recommend pelvic exams and Pap testing every 3 years, or they may recommend the Pap and pelvic exam, combined with testing for human papilloma virus (HPV), every 5 years. Some types of HPV increase your risk of cervical cancer. Testing for HPV may also be done on women of any age with unclear Pap test results.  Other health care providers may not recommend any screening for nonpregnant women who are considered low risk for pelvic cancer and who do not have symptoms. Ask your health care provider if a screening pelvic exam is right for you.  If you have had past treatment for cervical cancer or a condition that could lead to cancer, you need Pap tests and screening for cancer for at least 20 years after your treatment. If Pap tests have been discontinued, your risk factors (such as having a new sexual partner) need to be reassessed to determine if screening should resume. Some women have medical problems that increase the chance of getting cervical cancer. In these cases, your health care provider may  recommend more frequent screening and Pap tests.  Colorectal Cancer  This type of cancer can be detected and often prevented.  Routine colorectal cancer screening usually begins at 39 years of age and continues through 39 years of age.  Your health care provider may recommend screening at an earlier age if you have risk factors for colon cancer.  Your health care provider may also recommend using home test kits to check for hidden blood in the stool.  A small camera at the end of a tube can be used to examine your colon directly (sigmoidoscopy or colonoscopy). This is done to check for the earliest forms of colorectal cancer.  Routine screening usually begins at age 19.  Direct examination of the colon should be repeated every 5-10 years through 39 years of age. However, you may need to be screened more often if early forms of precancerous polyps or small growths are found.  Skin Cancer  Check your skin from head to toe regularly.  Tell your health care provider about any new moles or changes in moles, especially if there is a change in a mole's shape or color.  Also tell your health care provider if you have a mole that is larger than the size of a pencil eraser.  Always use sunscreen. Apply sunscreen liberally and repeatedly throughout the day.  Protect yourself by wearing long sleeves, pants, a wide-brimmed hat, and sunglasses whenever you are outside.  Heart disease, diabetes, and high blood pressure  High blood pressure causes heart disease and increases the risk of stroke. High blood pressure is more likely to develop in: ? People who have blood pressure in the high end of the normal range (130-139/85-89 mm Hg). ? People who are overweight or obese. ? People who are African  American.  If you are 79-26 years of age, have your blood pressure checked every 3-5 years. If you are 46 years of age or older, have your blood pressure checked every year. You should have your blood  pressure measured twice-once when you are at a hospital or clinic, and once when you are not at a hospital or clinic. Record the average of the two measurements. To check your blood pressure when you are not at a hospital or clinic, you can use: ? An automated blood pressure machine at a pharmacy. ? A home blood pressure monitor.  If you are between 3 years and 47 years old, ask your health care provider if you should take aspirin to prevent strokes.  Have regular diabetes screenings. This involves taking a blood sample to check your fasting blood sugar level. ? If you are at a normal weight and have a low risk for diabetes, have this test once every three years after 39 years of age. ? If you are overweight and have a high risk for diabetes, consider being tested at a younger age or more often. Preventing infection Hepatitis B  If you have a higher risk for hepatitis B, you should be screened for this virus. You are considered at high risk for hepatitis B if: ? You were born in a country where hepatitis B is common. Ask your health care provider which countries are considered high risk. ? Your parents were born in a high-risk country, and you have not been immunized against hepatitis B (hepatitis B vaccine). ? You have HIV or AIDS. ? You use needles to inject street drugs. ? You live with someone who has hepatitis B. ? You have had sex with someone who has hepatitis B. ? You get hemodialysis treatment. ? You take certain medicines for conditions, including cancer, organ transplantation, and autoimmune conditions.  Hepatitis C  Blood testing is recommended for: ? Everyone born from [birth year] through 1963. ? Anyone with known risk factors for hepatitis C.  Sexually transmitted infections (STIs)  You should be screened for sexually transmitted infections (STIs) including gonorrhea and chlamydia if: ? You are sexually active and are younger than 39 years of age. ? You are older than 39 years  of age and your health care provider tells you that you are at risk for this type of infection. ? Your sexual activity has changed since you were last screened and you are at an increased risk for chlamydia or gonorrhea. Ask your health care provider if you are at risk.  If you do not have HIV, but are at risk, it may be recommended that you take a prescription medicine daily to prevent HIV infection. This is called pre-exposure prophylaxis (PrEP). You are considered at risk if: ? You are sexually active and do not regularly use condoms or know the HIV status of your partner(s). ? You take drugs by injection. ? You are sexually active with a partner who has HIV.  Talk with your health care provider about whether you are at high risk of being infected with HIV. If you choose to begin PrEP, you should first be tested for HIV. You should then be tested every 3 months for as long as you are taking PrEP. Pregnancy  If you are premenopausal and you may become pregnant, ask your health care provider about preconception counseling.  If you may become pregnant, take 400 to 800 micrograms (mcg) of folic acid every day.  If you want to prevent  pregnancy, talk to your health care provider about birth control (contraception). Osteoporosis and menopause  Osteoporosis is a disease in which the bones lose minerals and strength with aging. This can result in serious bone fractures. Your risk for osteoporosis can be identified using a bone density scan.  If you are 79 years of age or older, or if you are at risk for osteoporosis and fractures, ask your health care provider if you should be screened.  Ask your health care provider whether you should take a calcium or vitamin D supplement to lower your risk for osteoporosis.  Menopause may have certain physical symptoms and risks.  Hormone replacement therapy may reduce some of these symptoms and risks. Talk to your health care provider about whether hormone  replacement therapy is right for you. Follow these instructions at home:  Schedule regular health, dental, and eye exams.  Stay current with your immunizations.  Do not use any tobacco products including cigarettes, chewing tobacco, or electronic cigarettes.  If you are pregnant, do not drink alcohol.  If you are breastfeeding, limit how much and how often you drink alcohol.  Limit alcohol intake to no more than 1 drink per day for nonpregnant women. One drink equals 12 ounces of beer, 5 ounces of wine, or 1 ounces of hard liquor.  Do not use street drugs.  Do not share needles.  Ask your health care provider for help if you need support or information about quitting drugs.  Tell your health care provider if you often feel depressed.  Tell your health care provider if you have ever been abused or do not feel safe at home. This information is not intended to replace advice given to you by your health care provider. Make sure you discuss any questions you have with your health care provider. Document Released: 11/20/2010 Document Revised: 10/13/2015 Document Reviewed: 02/08/2015 Elsevier Interactive Patient Education  2018 [place] NOW OFFER   [facility] FAST TRACK!!!  SAME DAY Appointments for ACUTE CARE  Such as: Sprains, Injuries, cuts, abrasions, rashes, muscle pain, joint pain, back pain Colds, flu, sore throats, headache, allergies, cough, fever  Ear pain, sinus and eye infections Abdominal pain, nausea, vomiting, diarrhea, upset stomach Animal/insect bites  3 Easy Ways to Schedule: Walk-In Scheduling Call in scheduling Mychart Sign-up: [email or web address]           No Follow-up on file.  Colin Benton R., DO

## 2016-11-08 ENCOUNTER — Encounter: Payer: Self-pay | Admitting: Family Medicine

## 2016-11-08 ENCOUNTER — Ambulatory Visit (INDEPENDENT_AMBULATORY_CARE_PROVIDER_SITE_OTHER): Payer: BC Managed Care – PPO | Admitting: Family Medicine

## 2016-11-08 VITALS — BP 98/58 | HR 60 | Temp 97.7°F | Ht 65.5 in | Wt 168.2 lb

## 2016-11-08 DIAGNOSIS — Z0000 Encounter for general adult medical examination without abnormal findings: Secondary | ICD-10-CM

## 2016-11-08 DIAGNOSIS — Z6827 Body mass index (BMI) 27.0-27.9, adult: Secondary | ICD-10-CM

## 2016-11-08 DIAGNOSIS — R5382 Chronic fatigue, unspecified: Secondary | ICD-10-CM | POA: Diagnosis not present

## 2016-11-08 LAB — TSH: TSH: 0.91 u[IU]/mL (ref 0.35–4.50)

## 2016-11-08 LAB — LIPID PANEL
CHOL/HDL RATIO: 3
Cholesterol: 109 mg/dL (ref 0–200)
HDL: 42.9 mg/dL (ref >39.00)
LDL Cholesterol: 55 mg/dL (ref 0–99)
NONHDL: 66.2
Triglycerides: 54 mg/dL (ref 0.0–149.0)
VLDL: 10.8 mg/dL (ref 0.0–40.0)

## 2016-11-08 LAB — HEMOGLOBIN A1C: Hgb A1c MFr Bld: 5.7 % (ref 4.6–6.5)

## 2016-11-08 NOTE — Patient Instructions (Addendum)
BEFORE YOU LEAVE: -follow up: yearly for physical -labs  Vit D3 [phone] IU daily - [employer] Vit D3 drops is one option  We have ordered labs or studies at this visit. It can take up to 1-2 weeks for results and processing. IF results require follow up or explanation, we will call you with instructions. Clinically stable results will be released to your [facility] If you have not heard from [place] or cannot find your results in [facility] in 2 weeks please contact our office at [phone].  If you are not yet signed up for [facility], please consider signing up.   Advise regular aerobic exercise (at least 150 minutes per week of sweaty exercise) and a healthy diet. Try to eat at least 5-9 servings of vegetables and fruits per day (not corn, potatoes or bananas.) Avoid sweets, red meat, pork, butter, fried foods, fast food, processed food, excessive dairy, eggs and coconut. Replace bad fats with good fats - fish, nuts and seeds, canola oil, olive oil.     Health Maintenance, Female Adopting a healthy lifestyle and getting preventive care can go a long way to promote health and wellness. Talk with your health care provider about what schedule of regular examinations is right for you. This is a good chance for you to check in with your provider about disease prevention and staying healthy. In between checkups, there are plenty of things you can do on your own. Experts have done a lot of research about which lifestyle changes and preventive measures are most likely to keep you healthy. Ask your health care provider for more information. Weight and diet Eat a healthy diet  Be sure to include plenty of vegetables, fruits, low-fat dairy products, and lean protein.  Do not eat a lot of foods high in solid fats, added sugars, or salt.  Get regular exercise. This is one of the most important things you can do for your health. ? Most adults should exercise for at least 150 minutes each week. The exercise  should increase your heart rate and make you sweat (moderate-intensity exercise). ? Most adults should also do strengthening exercises at least twice a week. This is in addition to the moderate-intensity exercise.  Maintain a healthy weight  Body mass index (BMI) is a measurement that can be used to identify possible weight problems. It estimates body fat based on height and weight. Your health care provider can help determine your BMI and help you achieve or maintain a healthy weight.  For females 92 years of age and older: ? A BMI below 18.5 is considered underweight. ? A BMI of 18.5 to 24.9 is normal. ? A BMI of 25 to 29.9 is considered overweight. ? A BMI of 30 and above is considered obese.  Watch levels of cholesterol and blood lipids  You should start having your blood tested for lipids and cholesterol at 39 years of age, then have this test every 5 years.  You may need to have your cholesterol levels checked more often if: ? Your lipid or cholesterol levels are high. ? You are older than 39 years of age. ? You are at high risk for heart disease.  Cancer screening Lung Cancer  Lung cancer screening is recommended for adults 22-63 years old who are at high risk for lung cancer because of a history of smoking.  A yearly low-dose CT scan of the lungs is recommended for people who: ? Currently smoke. ? Have quit within the past 15 years. ?  Have at least a 30-pack-year history of smoking. A pack year is smoking an average of one pack of cigarettes a day for 1 year.  Yearly screening should continue until it has been 15 years since you quit.  Yearly screening should stop if you develop a health problem that would prevent you from having lung cancer treatment.  Breast Cancer  Practice breast self-awareness. This means understanding how your breasts normally appear and feel.  It also means doing regular breast self-exams. Let your health care provider know about any changes, no  matter how small.  If you are in your 20s or 30s, you should have a clinical breast exam (CBE) by a health care provider every 1-3 years as part of a regular health exam.  If you are 56 or older, have a CBE every year. Also consider having a breast X-ray (mammogram) every year.  If you have a family history of breast cancer, talk to your health care provider about genetic screening.  If you are at high risk for breast cancer, talk to your health care provider about having an MRI and a mammogram every year.  Breast cancer gene (BRCA) assessment is recommended for women who have family members with BRCA-related cancers. BRCA-related cancers include: ? Breast. ? Ovarian. ? Tubal. ? Peritoneal cancers.  Results of the assessment will determine the need for genetic counseling and BRCA1 and BRCA2 testing.  Cervical Cancer Your health care provider may recommend that you be screened regularly for cancer of the pelvic organs (ovaries, uterus, and vagina). This screening involves a pelvic examination, including checking for microscopic changes to the surface of your cervix (Pap test). You may be encouraged to have this screening done every 3 years, beginning at age 30.  For women ages 38-65, health care providers may recommend pelvic exams and Pap testing every 3 years, or they may recommend the Pap and pelvic exam, combined with testing for human papilloma virus (HPV), every 5 years. Some types of HPV increase your risk of cervical cancer. Testing for HPV may also be done on women of any age with unclear Pap test results.  Other health care providers may not recommend any screening for nonpregnant women who are considered low risk for pelvic cancer and who do not have symptoms. Ask your health care provider if a screening pelvic exam is right for you.  If you have had past treatment for cervical cancer or a condition that could lead to cancer, you need Pap tests and screening for cancer for at least  20 years after your treatment. If Pap tests have been discontinued, your risk factors (such as having a new sexual partner) need to be reassessed to determine if screening should resume. Some women have medical problems that increase the chance of getting cervical cancer. In these cases, your health care provider may recommend more frequent screening and Pap tests.  Colorectal Cancer  This type of cancer can be detected and often prevented.  Routine colorectal cancer screening usually begins at 39 years of age and continues through 39 years of age.  Your health care provider may recommend screening at an earlier age if you have risk factors for colon cancer.  Your health care provider may also recommend using home test kits to check for hidden blood in the stool.  A small camera at the end of a tube can be used to examine your colon directly (sigmoidoscopy or colonoscopy). This is done to check for the earliest forms of colorectal  cancer.  Routine screening usually begins at age 7.  Direct examination of the colon should be repeated every 5-10 years through 39 years of age. However, you may need to be screened more often if early forms of precancerous polyps or small growths are found.  Skin Cancer  Check your skin from head to toe regularly.  Tell your health care provider about any new moles or changes in moles, especially if there is a change in a mole's shape or color.  Also tell your health care provider if you have a mole that is larger than the size of a pencil eraser.  Always use sunscreen. Apply sunscreen liberally and repeatedly throughout the day.  Protect yourself by wearing long sleeves, pants, a wide-brimmed hat, and sunglasses whenever you are outside.  Heart disease, diabetes, and high blood pressure  High blood pressure causes heart disease and increases the risk of stroke. High blood pressure is more likely to develop in: ? People who have blood pressure in the  high end of the normal range (130-139/85-89 mm Hg). ? People who are overweight or obese. ? People who are African American.  If you are 47-88 years of age, have your blood pressure checked every 3-5 years. If you are 60 years of age or older, have your blood pressure checked every year. You should have your blood pressure measured twice-once when you are at a hospital or clinic, and once when you are not at a hospital or clinic. Record the average of the two measurements. To check your blood pressure when you are not at a hospital or clinic, you can use: ? An automated blood pressure machine at a pharmacy. ? A home blood pressure monitor.  If you are between 33 years and 36 years old, ask your health care provider if you should take aspirin to prevent strokes.  Have regular diabetes screenings. This involves taking a blood sample to check your fasting blood sugar level. ? If you are at a normal weight and have a low risk for diabetes, have this test once every three years after 39 years of age. ? If you are overweight and have a high risk for diabetes, consider being tested at a younger age or more often. Preventing infection Hepatitis B  If you have a higher risk for hepatitis B, you should be screened for this virus. You are considered at high risk for hepatitis B if: ? You were born in a country where hepatitis B is common. Ask your health care provider which countries are considered high risk. ? Your parents were born in a high-risk country, and you have not been immunized against hepatitis B (hepatitis B vaccine). ? You have HIV or AIDS. ? You use needles to inject street drugs. ? You live with someone who has hepatitis B. ? You have had sex with someone who has hepatitis B. ? You get hemodialysis treatment. ? You take certain medicines for conditions, including cancer, organ transplantation, and autoimmune conditions.  Hepatitis C  Blood testing is recommended for: ? Everyone born  from [birth year] through 1963. ? Anyone with known risk factors for hepatitis C.  Sexually transmitted infections (STIs)  You should be screened for sexually transmitted infections (STIs) including gonorrhea and chlamydia if: ? You are sexually active and are younger than 39 years of age. ? You are older than 39 years of age and your health care provider tells you that you are at risk for this type of infection. ? Your  sexual activity has changed since you were last screened and you are at an increased risk for chlamydia or gonorrhea. Ask your health care provider if you are at risk.  If you do not have HIV, but are at risk, it may be recommended that you take a prescription medicine daily to prevent HIV infection. This is called pre-exposure prophylaxis (PrEP). You are considered at risk if: ? You are sexually active and do not regularly use condoms or know the HIV status of your partner(s). ? You take drugs by injection. ? You are sexually active with a partner who has HIV.  Talk with your health care provider about whether you are at high risk of being infected with HIV. If you choose to begin PrEP, you should first be tested for HIV. You should then be tested every 3 months for as long as you are taking PrEP. Pregnancy  If you are premenopausal and you may become pregnant, ask your health care provider about preconception counseling.  If you may become pregnant, take 400 to 800 micrograms (mcg) of folic acid every day.  If you want to prevent pregnancy, talk to your health care provider about birth control (contraception). Osteoporosis and menopause  Osteoporosis is a disease in which the bones lose minerals and strength with aging. This can result in serious bone fractures. Your risk for osteoporosis can be identified using a bone density scan.  If you are 71 years of age or older, or if you are at risk for osteoporosis and fractures, ask your health care provider if you should be  screened.  Ask your health care provider whether you should take a calcium or vitamin D supplement to lower your risk for osteoporosis.  Menopause may have certain physical symptoms and risks.  Hormone replacement therapy may reduce some of these symptoms and risks. Talk to your health care provider about whether hormone replacement therapy is right for you. Follow these instructions at home:  Schedule regular health, dental, and eye exams.  Stay current with your immunizations.  Do not use any tobacco products including cigarettes, chewing tobacco, or electronic cigarettes.  If you are pregnant, do not drink alcohol.  If you are breastfeeding, limit how much and how often you drink alcohol.  Limit alcohol intake to no more than 1 drink per day for nonpregnant women. One drink equals 12 ounces of beer, 5 ounces of wine, or 1 ounces of hard liquor.  Do not use street drugs.  Do not share needles.  Ask your health care provider for help if you need support or information about quitting drugs.  Tell your health care provider if you often feel depressed.  Tell your health care provider if you have ever been abused or do not feel safe at home. This information is not intended to replace advice given to you by your health care provider. Make sure you discuss any questions you have with your health care provider. Document Released: 11/20/2010 Document Revised: 10/13/2015 Document Reviewed: 02/08/2015 Elsevier Interactive Patient Education  2018 [place] NOW OFFER   [facility] FAST TRACK!!!  SAME DAY Appointments for ACUTE CARE  Such as: Sprains, Injuries, cuts, abrasions, rashes, muscle pain, joint pain, back pain Colds, flu, sore throats, headache, allergies, cough, fever  Ear pain, sinus and eye infections Abdominal pain, nausea, vomiting, diarrhea, upset stomach Animal/insect bites  3 Easy Ways to Schedule: Walk-In Scheduling Call in  scheduling Mychart Sign-up: [email or web address]

## 2017-05-13 ENCOUNTER — Telehealth: Payer: Self-pay | Admitting: Family Medicine

## 2017-05-13 MED ORDER — OSELTAMIVIR PHOSPHATE 75 MG PO CAPS: 75.0000 mg | ORAL_CAPSULE | Freq: Every day | ORAL | 0 refills | Status: DC

## 2017-05-13 NOTE — Telephone Encounter (Signed)
Copied from [place]. Topic: Quick Communication - Rx Refill/Question >> May 13, 2017  8:42 AM Scherrie Gerlach wrote: Pt's daughter was dx with Type A flu. Pt would like to know if Dr Maudie Mercury would send in [place] flu for the pt, just in case and since it is Christmas. Pt doesn't have any symptoms at present.  [place], [place]. [phone] (Phone) [phone] (Fax)

## 2017-05-13 NOTE — Telephone Encounter (Signed)
We can send in Tamiflu. If she is interested in prophylaxis dose will be 75 mg once daily for 10 days. If she starts developing symptoms would convert this over to 75 mg twice daily for 5 days

## 2017-05-13 NOTE — Telephone Encounter (Signed)
I called the pt and informed her of the message below.  Patient requested prophylaxis dosing at this time and is aware the Rx was sent and she can change dosing if needed.

## 2017-06-11 ENCOUNTER — Other Ambulatory Visit: Payer: Self-pay | Admitting: Obstetrics and Gynecology

## 2017-06-11 DIAGNOSIS — N644 Mastodynia: Secondary | ICD-10-CM

## 2017-06-17 ENCOUNTER — Ambulatory Visit: Payer: BC Managed Care – PPO

## 2017-06-17 ENCOUNTER — Ambulatory Visit
Admission: RE | Admit: 2017-06-17 | Discharge: 2017-06-17 | Disposition: A | Discharge status: Home / Self Care | Payer: BC Managed Care – PPO | Source: Ambulatory Visit | Attending: Obstetrics and Gynecology | Admitting: Obstetrics and Gynecology

## 2017-06-17 DIAGNOSIS — N644 Mastodynia: Secondary | ICD-10-CM

## 2017-10-01 ENCOUNTER — Encounter: Payer: Self-pay | Admitting: Cardiovascular Disease

## 2017-10-01 ENCOUNTER — Ambulatory Visit: Payer: BC Managed Care – PPO | Admitting: Cardiovascular Disease

## 2017-10-01 ENCOUNTER — Encounter (INDEPENDENT_AMBULATORY_CARE_PROVIDER_SITE_OTHER): Payer: Self-pay

## 2017-10-01 VITALS — BP 120/58 | HR 56 | Ht 65.6 in | Wt 174.0 lb

## 2017-10-01 DIAGNOSIS — R55 Syncope and collapse: Secondary | ICD-10-CM

## 2017-10-01 DIAGNOSIS — R001 Bradycardia, unspecified: Secondary | ICD-10-CM

## 2017-10-01 DIAGNOSIS — R002 Palpitations: Secondary | ICD-10-CM | POA: Diagnosis not present

## 2017-10-01 NOTE — Progress Notes (Signed)
Cardiology Office Note:    Date:  8/75/6433   ID:  Tamara [name], DOB 2/95/1882, MRN [id number]  PCP:  Lucretia Kern, DO  Cardiologist:  Mertie Moores, MD   Referring MD: Lucretia Kern, DO   Chief Complaint  Patient presents with  . Palpitations    History of Present Illness:    Tamara [name] is a 42 y.o. female with a hx of palpitatoins  palps for the past year.  Tend to occur more in the evening .  Also has them in the am .   Has them weekly .  Not known to be hormonal .  Does not exercise.   Not related to exertional activities. Last for a few seconds   Has rare episodes of CP with exertion  Also has pain in her chest at rest.    No real associated CP or dyspnea.   Feels the need to take a deep breath  Has slow HR at night   Had a slow HR in high school Non smoker  + family hx  monther has HTN, Grandmother has HTN Mat. Grandfather had a CVA    Has had lots of fatigue  tsh has been normal    Works as a Psychologist, clinical at [employer]    Past Medical History:  Diagnosis Date  . Anal fissure   . Chest tightness 07/22/2012  . Constipation 01/05/2014  . Hyperplastic colon polyp   . Internal hemorrhoids   . Psoriasis    sees dermatologist  . Sinus bradycardia   . Varicose veins     Past Surgical History:  Procedure Laterality Date  . ANAL RECTAL MANOMETRY N/A 03/30/2016   Procedure: ANO RECTAL MANOMETRY;  Surgeon: Mauri Pole, MD;  Location: [facility];  Service: Endoscopy;  Laterality: N/A;  . CESAREAN SECTION     2 c sections  . ENDOVENOUS ABLATION SAPHENOUS VEIN W/ LASER Right 04-07-2015   endovenous laser ablation Right GSV by Curt Jews MD  . MOLE REMOVAL    . TUBAL LIGATION    . WISDOM TOOTH EXTRACTION      Current Medications: No outpatient medications have been marked as taking for the 10/01/17 encounter (Office Visit) with Markie Heffernan, Wonda Cheng, MD.     Allergies:   Patient has no known allergies.    Social History   Socioeconomic History  . Marital status: Married    Spouse name: Not on file  . Number of children: 2  . Years of education: Not on file  . Highest education level: Not on file  Occupational History  . Occupation: Psychologist, clinical    Employer: [employer]  Social Needs  . Financial resource strain: Not on file  . Food insecurity:    Worry: Not on file    Inability: Not on file  . Transportation needs:    Medical: Not on file    Non-medical: Not on file  Tobacco Use  . Smoking status: Never Smoker  . Smokeless tobacco: Never Used  Substance and Sexual Activity  . Alcohol use: No    Alcohol/week: 0.0 oz  . Drug use: No  . Sexual activity: Yes    Partners: Male  Lifestyle  . Physical activity:    Days per week: Not on file    Minutes per session: Not on file  . Stress: Not on file  Relationships  . Social connections:    Talks on phone: Not on file  Gets together: Not on file    Attends religious service: Not on file    Active member of club or organization: Not on file    Attends meetings of clubs or organizations: Not on file    Relationship status: Not on file  Other Topics Concern  . Not on file  Social History Narrative   ork or School: Psychologist, clinical      Home Situation: lives with husband and son and daughter and granmother      Spiritual Beliefs: Christian      Lifestyle: no regular CV exercise; diet is good           Family History: The patient's family history includes Alcohol abuse in her father and mother; Heart disease in her maternal grandfather and paternal grandmother; Hypertension in her maternal grandmother and mother; Stroke in her maternal grandfather; Varicose Veins in her father. There is no history of Colon cancer or Stomach cancer.  ROS:   Please see the history of present illness.     All other systems reviewed and are negative.  EKGs/Labs/Other Studies Reviewed:    The  following studies were reviewed today:   EKG:   Oct 01, 2017: Sinus bradycardia at 56 beats a minute.  No ST or T wave changes.  EKG is normal.  Recent Labs: 11/08/2016: TSH 0.91  Recent Lipid Panel    Component Value Date/Time   CHOL 109 11/08/2016 0849   TRIG 54.0 11/08/2016 0849   HDL 42.90 11/08/2016 0849   CHOLHDL 3 11/08/2016 0849   VLDL 10.8 11/08/2016 0849   LDLCALC 55 11/08/2016 0849    Physical Exam:    VS:  BP (!) 120/58   Pulse (!) 56   Ht 5' 5.6" (1.666 m)   Wt 174 lb (78.9 kg)   SpO2 98%   BMI 28.43 kg/m     Wt Readings from Last 3 Encounters:  10/01/17 174 lb (78.9 kg)  11/08/16 168 lb 3.2 oz (76.3 kg)  09/05/16 168 lb 4 oz (76.3 kg)     GEN:  Well nourished, well developed in no acute distress HEENT: Normal NECK: No JVD; No carotid bruits LYMPHATICS: No lymphadenopathy CARDIAC: RRR, no murmurs, rubs, gallops RESPIRATORY:  Clear to auscultation without rales, wheezing or rhonchi  ABDOMEN: Soft, non-tender, non-distended MUSCULOSKELETAL:  No edema; No deformity  SKIN: Warm and dry NEUROLOGIC:  Alert and oriented x 3 PSYCHIATRIC:  Normal affect   ASSESSMENT:    1. Sinus bradycardia   2. Palpitations   3. Pre-syncope    PLAN:    In order of problems listed above:  1. Palpitations: Salli presents for further evaluation of palpitations.  Clinically the sound like benign premature ventricular contractions.  They only last for a second or so and are not really associated with any dizziness, chest pain or shortness of breath.  They are somewhat distracted.  Will place an event monitor for further evaluation.  2.  Near syncope: Addition to her palpitations, she has episodes of near syncope.  These occur spontaneously and are not associated with her palpitations.  She has history of bradycardia at baseline and may be having episodes of worsening bradycardia.   Medication Adjustments/Labs and Tests Ordered: Current medicines are reviewed at length  with the patient today.  Concerns regarding medicines are outlined above.  Orders Placed This Encounter  Procedures  . Cardiac event monitor  . EKG 12-Lead   No orders of the defined types were placed in this  encounter.   Patient Instructions  Medication Instructions:  Your physician recommends that you continue on your current medications as directed. Please refer to the Current Medication list given to you today.   Labwork: None Ordered   Testing/Procedures: Your physician has recommended that you wear an event monitor. Event monitors are medical devices that record the heart's electrical activity. Doctors most often [place] these monitors to diagnose arrhythmias. Arrhythmias are problems with the speed or rhythm of the heartbeat. The monitor is a small, portable device. You can wear one while you do your normal daily activities. This is usually used to diagnose what is causing palpitations/syncope (passing out).   Follow-Up: Your physician recommends that you schedule a follow-up appointment in: 4 months with Dr. Acie Fredrickson   If you need a refill on your cardiac medications before your next appointment, please call your pharmacy.   Thank you for choosing [facility]! Christen Bame, RN [phone]       Signed, Mertie Moores, MD  10/01/2017 3:52 PM    [facility]

## 2017-10-01 NOTE — Patient Instructions (Signed)
Medication Instructions:  Your physician recommends that you continue on your current medications as directed. Please refer to the Current Medication list given to you today.   Labwork: None Ordered   Testing/Procedures: Your physician has recommended that you wear an event monitor. Event monitors are medical devices that record the heart's electrical activity. Doctors most often [place] these monitors to diagnose arrhythmias. Arrhythmias are problems with the speed or rhythm of the heartbeat. The monitor is a small, portable device. You can wear one while you do your normal daily activities. This is usually used to diagnose what is causing palpitations/syncope (passing out).   Follow-Up: Your physician recommends that you schedule a follow-up appointment in: 4 months with Dr. Acie Fredrickson   If you need a refill on your cardiac medications before your next appointment, please call your pharmacy.   Thank you for choosing [facility]! Christen Bame, RN [phone]

## 2017-10-04 ENCOUNTER — Ambulatory Visit (INDEPENDENT_AMBULATORY_CARE_PROVIDER_SITE_OTHER): Payer: BC Managed Care – PPO

## 2017-10-04 DIAGNOSIS — R001 Bradycardia, unspecified: Secondary | ICD-10-CM | POA: Diagnosis not present

## 2017-10-04 DIAGNOSIS — R55 Syncope and collapse: Secondary | ICD-10-CM

## 2017-10-04 DIAGNOSIS — R002 Palpitations: Secondary | ICD-10-CM

## 2017-11-07 NOTE — Progress Notes (Signed)
HPI:  Using dictation device. Unfortunately this device frequently misinterprets words/phrases.  Here for CPE: Due for hgba1c Seeing cardiology for sinus bradycardia. Seeing gyn for pelvic/breast/pap exams. Seeing dermatology for skin exams  -Diet: variety of foods, balance and well rounded -Exercise: no regular exercise -Taking folic acid, vitamin D or calcium: no - requests recommendations for normal daily recs for Vit D -Diabetes and Dyslipidemia Screening: fasting for labs -Vaccines: see vaccine section EPIC -pap history: sees KathyHarris, last pap 2017 normal -FDLMP: see nursing notes -sexual activity: yes, female partner, no new partners -wants STI testing (Hep C if born [birth year]): no -FH breast, colon or ovarian ca: see FH Last mammogram: sees gyn Last colon cancer screening: n/a Breast Ca Risk Assessment: see family history and pt history DEXA (>/= 60): n/a  -Alcohol, Tobacco, drug use: see social history  Review of Systems - no fevers, unintentional weight loss, vision loss, hearing loss, chest pain, sob, hemoptysis, melena, hematochezia, hematuria, genital discharge, changing or concerning skin lesions, bleeding, bruising, loc, thoughts of self harm or SI  Past Medical History:  Diagnosis Date  . Anal fissure   . Chest tightness 07/22/2012  . Constipation 01/05/2014  . Hyperplastic colon polyp   . Internal hemorrhoids   . Psoriasis    sees dermatologist  . Sinus bradycardia   . Varicose veins     Past Surgical History:  Procedure Laterality Date  . ANAL RECTAL MANOMETRY N/A 03/30/2016   Procedure: ANO RECTAL MANOMETRY;  Surgeon: Mauri Pole, MD;  Location: [facility];  Service: Endoscopy;  Laterality: N/A;  . CESAREAN SECTION     2 c sections  . ENDOVENOUS ABLATION SAPHENOUS VEIN W/ LASER Right 04-07-2015   endovenous laser ablation Right GSV by Curt Jews MD  . MOLE REMOVAL    . TUBAL LIGATION    . WISDOM TOOTH EXTRACTION      Family History    Problem Relation Age of Onset  . Heart disease Maternal Grandfather   . Stroke Maternal Grandfather   . Alcohol abuse Mother   . Hypertension Mother   . Alcohol abuse Father   . Varicose Veins Father   . Hypertension Maternal Grandmother   . Heart disease Paternal Grandmother        AAA-  After age 17  . Colon cancer Neg Hx   . Stomach cancer Neg Hx     Social History   Socioeconomic History  . Marital status: Married    Spouse name: Not on file  . Number of children: 2  . Years of education: Not on file  . Highest education level: Not on file  Occupational History  . Occupation: Psychologist, clinical    Employer: [employer]  Social Needs  . Financial resource strain: Not on file  . Food insecurity:    Worry: Not on file    Inability: Not on file  . Transportation needs:    Medical: Not on file    Non-medical: Not on file  Tobacco Use  . Smoking status: Never Smoker  . Smokeless tobacco: Never Used  Substance and Sexual Activity  . Alcohol use: No    Alcohol/week: 0.0 oz  . Drug use: No  . Sexual activity: Yes    Partners: Male  Lifestyle  . Physical activity:    Days per week: Not on file    Minutes per session: Not on file  . Stress: Not on file  Relationships  . Social connections:  Talks on phone: Not on file    Gets together: Not on file    Attends religious service: Not on file    Active member of club or organization: Not on file    Attends meetings of clubs or organizations: Not on file    Relationship status: Not on file  Other Topics Concern  . Not on file  Social History Narrative   ork or School: Psychologist, clinical      Home Situation: lives with husband and son and daughter and granmother      Spiritual Beliefs: Christian      Lifestyle: no regular CV exercise; diet is good           Current Outpatient Medications:  .  clobetasol ointment (TEMOVATE) 6.27 %, Apply 1 application topically as needed., Disp: ,  Rfl:  .  desonide (DESOWEN) 0.05 % cream, Apply topically 2 (two) times daily as needed. , Disp: , Rfl:  .  docusate sodium (COLACE) 100 MG capsule, Take 1 capsule by mouth daily as needed for constipation., Disp: , Rfl:  .  fluocinonide (LIDEX) 0.05 % external solution, Apply 1 application topically daily., Disp: , Rfl:   EXAM:  Vitals:   11/11/17 0806  BP: 92/72  Pulse: (!) 50  Temp: (!) 97.5 F (36.4 C)   Body mass index is 27.76 kg/m.  GENERAL: vitals reviewed and listed below, alert, oriented, appears well hydrated and in no acute distress  HEENT: head atraumatic, PERRLA, normal appearance of eyes, ears, nose and mouth. moist mucus membranes.  NECK: supple, no masses or lymphadenopathy  LUNGS: clear to auscultation bilaterally, no rales, rhonchi or wheeze  CV: HRRR, no peripheral edema or cyanosis, normal pedal pulses  ABDOMEN: bowel sounds normal, soft, non tender to palpation, no masses, no rebound or guarding  GU/BREAST: sees gyn  SKIN: no rash or abnormal lesions, fair skin, freckles, sees derm for full skin exams  MS: normal gait, moves all extremities normally  NEURO: normal gait, speech and thought processing grossly intact, muscle tone grossly intact throughout  PSYCH: normal affect, pleasant and cooperative  ASSESSMENT AND PLAN:  Discussed the following assessment and plan:  PREVENTIVE EXAM: -Discussed and advised all [place] preventive services health task force level A and B recommendations for age, sex and risks. -Advised at least 150 minutes of exercise per week and a healthy diet with avoidance of (less then 1 serving per week) processed foods, white starches, red meat, fast foods and sweets and consisting of: * 5-9 servings of fresh fruits and vegetables (not corn or potatoes) *nuts and seeds, beans *olives and olive oil *lean meats such as fish and white chicken  *whole grains -labs, studies and vaccines per orders this encounter - Lipid panel  2.  BMI 27.0-27.9,adult -recommend healthy whole foods based low sugar diet and regular exercise - summary in pt instru  3. Hyperglycemia - Hemoglobin A1c -Recommend healthy low sugar diet and regular exercise  4. Bradycardia -Seeing cardiology  5. Screening for depression -See PHQ 9   Patient advised to return to clinic immediately if symptoms worsen or persist or new concerns.  Patient Instructions  BEFORE YOU LEAVE: -labs -follow up: annually for physical  We have ordered labs or studies at this visit. It can take up to 1-2 weeks for results and processing. IF results require follow up or explanation, we will call you with instructions. Clinically stable results will be released to your [facility]. If you have not heard from  [place] or cannot find your results in [facility] in 2 weeks please contact our office at [phone].  If you are not yet signed up for [facility], please consider signing up.   We recommend the following healthy lifestyle for LIFE: 1) Small portions. But, make sure to get regular (at least 3 per day), healthy meals and small healthy snacks if needed.  2) Eat a healthy clean diet.   TRY TO EAT: -at least 5-7 servings of low sugar, colorful, and nutrient rich vegetables per day (not corn, potatoes or bananas.) -berries are the best choice if you wish to eat fruit (only eat small amounts if trying to reduce weight)  -lean meets (fish, white meat of chicken or [place]) -vegan proteins for some meals - beans or tofu, whole grains, nuts and seeds -Replace bad fats with good fats - good fats include: fish, nuts and seeds, canola oil, olive oil -small amounts of low fat or non fat dairy -small amounts of100 % whole grains - check the lables -drink plenty of water  AVOID: -SUGAR, sweets, anything with added sugar, corn syrup or sweeteners - must read labels as even foods advertised as "healthy" often are loaded with sugar -if you must have a sweetener, small amounts of stevia  may be best -sweetened beverages and artificially sweetened beverages -simple starches (rice, bread, potatoes, pasta, chips, etc - small amounts of 100% whole grains are ok) -red meat, pork, butter -fried foods, fast food, processed food, excessive dairy, eggs and coconut.  3)Get at least 150 minutes of sweaty aerobic exercise per week.  4)Reduce stress - consider counseling, meditation and relaxation to balance other aspects of your life.   Preventive Care 18-39 Years, Female Preventive care refers to lifestyle choices and visits with your health care provider that can promote health and wellness. What does preventive care include?  A yearly physical exam. This is also called an annual well check.  Dental exams once or twice a year.  Routine eye exams. Ask your health care provider how often you should have your eyes checked.  Personal lifestyle choices, including: ? Daily care of your teeth and gums. ? Regular physical activity. ? Eating a healthy diet. ? Avoiding tobacco and drug use. ? Limiting alcohol use. ? Practicing safe sex. ? Taking vitamin and mineral supplements as recommended by your health care provider. What happens during an annual well check? The services and screenings done by your health care provider during your annual well check will depend on your age, overall health, lifestyle risk factors, and family history of disease. Counseling Your health care provider may ask you questions about your:  Alcohol use.  Tobacco use.  Drug use.  Emotional well-being.  Home and relationship well-being.  Sexual activity.  Eating habits.  Work and work Statistician.  Method of birth control.  Menstrual cycle.  Pregnancy history.  Screening You may have the following tests or measurements:  Height, weight, and BMI.  Diabetes screening. This is done by checking your blood sugar (glucose) after you have not eaten for a while (fasting).  Blood  pressure.  Lipid and cholesterol levels. These may be checked every 5 years starting at age 73.  Skin check.  Hepatitis C blood test.  Hepatitis B blood test.  Sexually transmitted disease (STD) testing.  BRCA-related cancer screening. This may be done if you have a family history of breast, ovarian, tubal, or peritoneal cancers.  Pelvic exam and Pap test. This may be done every 3 years  starting at age 6. Starting at age 49, this may be done every 5 years if you have a Pap test in combination with an HPV test.  Discuss your test results, treatment options, and if necessary, the need for more tests with your health care provider. Vaccines Your health care provider may recommend certain vaccines, such as:  Influenza vaccine. This is recommended every year.  Tetanus, diphtheria, and acellular pertussis (Tdap, Td) vaccine. You may need a Td booster every 10 years.  Varicella vaccine. You may need this if you have not been vaccinated.  HPV vaccine. If you are 52 or younger, you may need three doses over 6 months.  Measles, mumps, and rubella (MMR) vaccine. You may need at least one dose of MMR. You may also need a second dose.  Pneumococcal 13-valent conjugate (PCV13) vaccine. You may need this if you have certain conditions and were not previously vaccinated.  Pneumococcal polysaccharide (PPSV23) vaccine. You may need one or two doses if you smoke cigarettes or if you have certain conditions.  Meningococcal vaccine. One dose is recommended if you are age 37-21 years and a first-year college student living in a residence hall, or if you have one of several medical conditions. You may also need additional booster doses.  Hepatitis A vaccine. You may need this if you have certain conditions or if you travel or work in places where you may be exposed to hepatitis A.  Hepatitis B vaccine. You may need this if you have certain conditions or if you travel or work in places where you may  be exposed to hepatitis B.  Haemophilus influenzae type b (Hib) vaccine. You may need this if you have certain risk factors.  Talk to your health care provider about which screenings and vaccines you need and how often you need them. This information is not intended to replace advice given to you by your health care provider. Make sure you discuss any questions you have with your health care provider. Document Released: 07/03/2001 Document Revised: 01/25/2016 Document Reviewed: 03/08/2015 Elsevier Interactive Patient Education  2018 [employer].         No follow-ups on file.  Lucretia Kern, DO

## 2017-11-11 ENCOUNTER — Telehealth: Payer: Self-pay | Admitting: Cardiovascular Disease

## 2017-11-11 ENCOUNTER — Encounter: Payer: Self-pay | Admitting: Family Medicine

## 2017-11-11 ENCOUNTER — Ambulatory Visit (INDEPENDENT_AMBULATORY_CARE_PROVIDER_SITE_OTHER): Payer: BC Managed Care – PPO | Admitting: Family Medicine

## 2017-11-11 VITALS — BP 92/72 | HR 50 | Temp 97.5°F | Ht 65.5 in | Wt 169.4 lb

## 2017-11-11 DIAGNOSIS — R739 Hyperglycemia, unspecified: Secondary | ICD-10-CM | POA: Diagnosis not present

## 2017-11-11 DIAGNOSIS — Z1331 Encounter for screening for depression: Secondary | ICD-10-CM | POA: Diagnosis not present

## 2017-11-11 DIAGNOSIS — R001 Bradycardia, unspecified: Secondary | ICD-10-CM

## 2017-11-11 DIAGNOSIS — Z0000 Encounter for general adult medical examination without abnormal findings: Secondary | ICD-10-CM

## 2017-11-11 DIAGNOSIS — Z6827 Body mass index (BMI) 27.0-27.9, adult: Secondary | ICD-10-CM | POA: Diagnosis not present

## 2017-11-11 LAB — LIPID PANEL
Cholesterol: 125 mg/dL (ref 0–200)
HDL: 48.6 mg/dL (ref >39.00)
LDL CALC: 60 mg/dL (ref 0–99)
NonHDL: 75.98
Total CHOL/HDL Ratio: 3
Triglycerides: 78 mg/dL (ref 0.0–149.0)
VLDL: 15.6 mg/dL (ref 0.0–40.0)

## 2017-11-11 LAB — HEMOGLOBIN A1C: Hgb A1c MFr Bld: 5.7 % (ref 4.6–6.5)

## 2017-11-11 NOTE — Telephone Encounter (Signed)
Reviewed monitor results with patient and answered questions to her satisfaction. I advised her to call back with questions prior to her appointment with Dr. Acie Fredrickson in September. She thanked me for the call.

## 2017-11-11 NOTE — Patient Instructions (Signed)
BEFORE YOU LEAVE: -labs -follow up: annually for physical  We have ordered labs or studies at this visit. It can take up to 1-2 weeks for results and processing. IF results require follow up or explanation, we will call you with instructions. Clinically stable results will be released to your [facility]. If you have not heard from [place] or cannot find your results in [facility] in 2 weeks please contact our office at [phone].  If you are not yet signed up for [facility], please consider signing up.   We recommend the following healthy lifestyle for LIFE: 1) Small portions. But, make sure to get regular (at least 3 per day), healthy meals and small healthy snacks if needed.  2) Eat a healthy clean diet.   TRY TO EAT: -at least 5-7 servings of low sugar, colorful, and nutrient rich vegetables per day (not corn, potatoes or bananas.) -berries are the best choice if you wish to eat fruit (only eat small amounts if trying to reduce weight)  -lean meets (fish, white meat of chicken or [place]) -vegan proteins for some meals - beans or tofu, whole grains, nuts and seeds -Replace bad fats with good fats - good fats include: fish, nuts and seeds, canola oil, olive oil -small amounts of low fat or non fat dairy -small amounts of100 % whole grains - check the lables -drink plenty of water  AVOID: -SUGAR, sweets, anything with added sugar, corn syrup or sweeteners - must read labels as even foods advertised as "healthy" often are loaded with sugar -if you must have a sweetener, small amounts of stevia may be best -sweetened beverages and artificially sweetened beverages -simple starches (rice, bread, potatoes, pasta, chips, etc - small amounts of 100% whole grains are ok) -red meat, pork, butter -fried foods, fast food, processed food, excessive dairy, eggs and coconut.  3)Get at least 150 minutes of sweaty aerobic exercise per week.  4)Reduce stress - consider counseling, meditation and relaxation to  balance other aspects of your life.   Preventive Care 18-39 Years, Female Preventive care refers to lifestyle choices and visits with your health care provider that can promote health and wellness. What does preventive care include?  A yearly physical exam. This is also called an annual well check.  Dental exams once or twice a year.  Routine eye exams. Ask your health care provider how often you should have your eyes checked.  Personal lifestyle choices, including: ? Daily care of your teeth and gums. ? Regular physical activity. ? Eating a healthy diet. ? Avoiding tobacco and drug use. ? Limiting alcohol use. ? Practicing safe sex. ? Taking vitamin and mineral supplements as recommended by your health care provider. What happens during an annual well check? The services and screenings done by your health care provider during your annual well check will depend on your age, overall health, lifestyle risk factors, and family history of disease. Counseling Your health care provider may ask you questions about your:  Alcohol use.  Tobacco use.  Drug use.  Emotional well-being.  Home and relationship well-being.  Sexual activity.  Eating habits.  Work and work Statistician.  Method of birth control.  Menstrual cycle.  Pregnancy history.  Screening You may have the following tests or measurements:  Height, weight, and BMI.  Diabetes screening. This is done by checking your blood sugar (glucose) after you have not eaten for a while (fasting).  Blood pressure.  Lipid and cholesterol levels. These may be checked every 5 years  starting at age 75.  Skin check.  Hepatitis C blood test.  Hepatitis B blood test.  Sexually transmitted disease (STD) testing.  BRCA-related cancer screening. This may be done if you have a family history of breast, ovarian, tubal, or peritoneal cancers.  Pelvic exam and Pap test. This may be done every 3 years starting at age 18.  Starting at age 38, this may be done every 5 years if you have a Pap test in combination with an HPV test.  Discuss your test results, treatment options, and if necessary, the need for more tests with your health care provider. Vaccines Your health care provider may recommend certain vaccines, such as:  Influenza vaccine. This is recommended every year.  Tetanus, diphtheria, and acellular pertussis (Tdap, Td) vaccine. You may need a Td booster every 10 years.  Varicella vaccine. You may need this if you have not been vaccinated.  HPV vaccine. If you are 32 or younger, you may need three doses over 6 months.  Measles, mumps, and rubella (MMR) vaccine. You may need at least one dose of MMR. You may also need a second dose.  Pneumococcal 13-valent conjugate (PCV13) vaccine. You may need this if you have certain conditions and were not previously vaccinated.  Pneumococcal polysaccharide (PPSV23) vaccine. You may need one or two doses if you smoke cigarettes or if you have certain conditions.  Meningococcal vaccine. One dose is recommended if you are age 39-21 years and a first-year college student living in a residence hall, or if you have one of several medical conditions. You may also need additional booster doses.  Hepatitis A vaccine. You may need this if you have certain conditions or if you travel or work in places where you may be exposed to hepatitis A.  Hepatitis B vaccine. You may need this if you have certain conditions or if you travel or work in places where you may be exposed to hepatitis B.  Haemophilus influenzae type b (Hib) vaccine. You may need this if you have certain risk factors.  Talk to your health care provider about which screenings and vaccines you need and how often you need them. This information is not intended to replace advice given to you by your health care provider. Make sure you discuss any questions you have with your health care provider. Document  Released: 07/03/2001 Document Revised: 01/25/2016 Document Reviewed: 03/08/2015 Elsevier Interactive Patient Education  [employer].

## 2017-11-11 NOTE — Telephone Encounter (Signed)
Pt calling back concerning results

## 2018-02-13 ENCOUNTER — Encounter: Payer: Self-pay | Admitting: Family Medicine

## 2018-02-13 ENCOUNTER — Ambulatory Visit: Payer: BC Managed Care – PPO | Admitting: Family Medicine

## 2018-02-13 ENCOUNTER — Ambulatory Visit: Payer: BC Managed Care – PPO | Admitting: Cardiovascular Disease

## 2018-02-13 VITALS — BP 108/58 | HR 53 | Temp 98.5°F | Ht 65.5 in | Wt 175.4 lb

## 2018-02-13 DIAGNOSIS — M25559 Pain in unspecified hip: Secondary | ICD-10-CM | POA: Diagnosis not present

## 2018-02-13 DIAGNOSIS — M9906 Segmental and somatic dysfunction of lower extremity: Secondary | ICD-10-CM

## 2018-02-13 NOTE — Progress Notes (Signed)
HPI:  Using dictation device. Unfortunately this device frequently misinterprets words/phrases.  Tamara [name] is a very pleasant 42 year old here for an acute visit for left lateral hip pain.  Reports this is been going on for about a year.  Though she thinks is been worse the last 4 to 6 months.  The pain is worse when she lies on the side or walks for a longer period of time or sits in the car for a long period of time.  The pain is a burning or sharp pain in the left lateral hip.  Is better with the change in her mattress.  Ibuprofen helps.  No radiation, weakness, numbness or fevers, malaise, low back pain or bowel or bladder incontinence reported.  ROS: See pertinent positives and negatives per HPI.  Past Medical History:  Diagnosis Date  . Anal fissure   . Chest tightness 07/22/2012  . Constipation 01/05/2014  . Hyperplastic colon polyp   . Internal hemorrhoids   . Psoriasis    sees dermatologist  . Sinus bradycardia   . Varicose veins     Past Surgical History:  Procedure Laterality Date  . ANAL RECTAL MANOMETRY N/A 03/30/2016   Procedure: ANO RECTAL MANOMETRY;  Surgeon: Mauri Pole, MD;  Location: [facility];  Service: Endoscopy;  Laterality: N/A;  . CESAREAN SECTION     2 c sections  . ENDOVENOUS ABLATION SAPHENOUS VEIN W/ LASER Right 04-07-2015   endovenous laser ablation Right GSV by Curt Jews MD  . MOLE REMOVAL    . TUBAL LIGATION    . WISDOM TOOTH EXTRACTION      Family History  Problem Relation Age of Onset  . Heart disease Maternal Grandfather   . Stroke Maternal Grandfather   . Alcohol abuse Mother   . Hypertension Mother   . Alcohol abuse Father   . Varicose Veins Father   . Hypertension Maternal Grandmother   . Heart disease Paternal Grandmother        AAA-  After age 75  . Colon cancer Neg Hx   . Stomach cancer Neg Hx     SOCIAL HX: See HPI   Current Outpatient Medications:  .  cetirizine (ZYRTEC) 10 MG tablet, Take 10 mg by mouth  daily., Disp: , Rfl:  .  clobetasol ointment (TEMOVATE) 5.73 %, Apply 1 application topically as needed., Disp: , Rfl:  .  desonide (DESOWEN) 0.05 % cream, Apply topically 2 (two) times daily as needed. , Disp: , Rfl:  .  docusate sodium (COLACE) 100 MG capsule, Take 1 capsule by mouth daily as needed for constipation., Disp: , Rfl:  .  fluocinonide (LIDEX) 0.05 % external solution, Apply 1 application topically daily., Disp: , Rfl:   EXAM:  Vitals:   02/13/18 1554  BP: (!) 108/58  Pulse: (!) 53  Temp: 98.5 F (36.9 C)    Body mass index is 28.74 kg/m.  GENERAL: vitals reviewed and listed above, alert, oriented, appears well hydrated and in no acute distress  HEENT: atraumatic, conjunttiva clear, no obvious abnormalities on inspection of external nose and ears  NECK: no obvious masses on inspection  LUNGS: clear to auscultation bilaterally, no wheezes, rales or rhonchi, good air movement  CV: HRRR, no peripheral edema  MS: moves all extremities without noticeable abnormality, Normal Gait Normal inspection of back, no obvious scoliosis or leg length descrepancy No bony TTP Soft tissue TTP at: Left lateral hip in the greater trochanter region, IT band tender point -/+ tests: neg  trendelenburg,-facet loading, -SLRT, -CLRT, -FABER, -FADIR Normal muscle strength, sensation to light touch and DTRs in LEs bilaterally  PSYCH: pleasant and cooperative, no obvious depression or anxiety  ASSESSMENT AND PLAN:  Discussed the following assessment and plan:  Lateral pain of hip - Plan: Ambulatory referral to Physical Therapy  Somatic dysfunction of lower extremity  -we discussed possible serious and likely etiologies, workup and treatment, treatment risks and return precautions; suspect IT band pathology, greater trochanteric bursitis possible, dysfunction present -after this discussion, Jermani opted for formal physical therapy, she prefers to do this at [facility], referral placed.   Ice, and NSAID for 1 week.  Modification of activity.  Also she decided to do OMM today.  See below. -follow up advised in 1 month, sooner as needed if any worsening or not improving, or  new concerns arise.  PROCEDURE NOTE : OSTEOPATHIC TREATMENT The decision today to treat with gentle Osteopathic Manipulative Therapy  (OMT) was based on physical exam findings, diagnoses and patient wishes. Verbal consent was obtained after after explanation of risks and benefits. No Cervical HVLA manipulation was performed. After consent was obtained, treatment was  performed as below:      Regions treated:  LE     Techniques used: counterstrain The patient tolerated the treatment well.   Patient Instructions  BEFORE YOU LEAVE: -follow up: 1 month  -We placed a referral for you as discussed to the physical therapist. It usually takes about 1-2 weeks to process and schedule this referral. If you have not heard from [place] regarding this appointment in 2 weeks please contact our office.  -aleve twice daily for 1 week  -ice twice daily for 1 week       Lucretia Kern, DO

## 2018-02-13 NOTE — Patient Instructions (Signed)
BEFORE YOU LEAVE: -follow up: 1 month  -We placed a referral for you as discussed to the physical therapist. It usually takes about 1-2 weeks to process and schedule this referral. If you have not heard from [place] regarding this appointment in 2 weeks please contact our office.  -aleve twice daily for 1 week  -ice twice daily for 1 week

## 2018-02-17 ENCOUNTER — Telehealth: Payer: Self-pay | Admitting: Family Medicine

## 2018-02-17 DIAGNOSIS — M25559 Pain in unspecified hip: Secondary | ICD-10-CM

## 2018-02-17 NOTE — Telephone Encounter (Signed)
Copied from [place] [id number]. Topic: General - Other >> Feb 17, 2018  1:10 PM Lennox Solders wrote: Reason for CRM:pt is calling and she must pay 200.00 per visit for physical therapy. Pt would like to know if she can have hip xray before proceeding with physical therapy

## 2018-02-18 NOTE — Telephone Encounter (Signed)
We can get a hip xray - though it is unlikely that this is a hip joint issues given the location of her pain and the xray is unlikely to help. We will order the xray to look. If ok, perhaps we could have her see our sports medicine doctor? Please place orders for hip xrays.

## 2018-02-18 NOTE — Telephone Encounter (Signed)
I called the pt and informed her of the message below. She stated she would like to see one of our sports medicine physicians instead of the x-ray. Referral placed and the pt is aware someone will call with appt info.

## 2018-02-26 ENCOUNTER — Encounter: Payer: Self-pay | Admitting: Family Medicine

## 2018-03-13 ENCOUNTER — Ambulatory Visit: Payer: BC Managed Care – PPO | Admitting: Family Medicine

## 2018-03-26 ENCOUNTER — Ambulatory Visit: Payer: BC Managed Care – PPO | Admitting: Cardiovascular Disease

## 2018-05-12 ENCOUNTER — Ambulatory Visit: Payer: BC Managed Care – PPO | Admitting: Family Medicine

## 2018-05-12 ENCOUNTER — Encounter: Payer: Self-pay | Admitting: Family Medicine

## 2018-05-12 VITALS — BP 98/60 | HR 62 | Temp 98.2°F | Ht 65.5 in | Wt 172.6 lb

## 2018-05-12 DIAGNOSIS — M25552 Pain in left hip: Secondary | ICD-10-CM

## 2018-05-12 DIAGNOSIS — Z0289 Encounter for other administrative examinations: Secondary | ICD-10-CM

## 2018-05-12 NOTE — Patient Instructions (Addendum)
-  We placed a referral for you as discussed for this issue. It usually takes about 1-2 weeks to process and schedule this referral. If you have not heard from [place] regarding this appointment in 2 weeks please contact our office.

## 2018-05-12 NOTE — Progress Notes (Signed)
HPI:  Using dictation device. Unfortunately this device frequently misinterprets words/phrases.  L lateral hip pain: -x > 1 year at initial visit 9/19  Hx initial visit: -worse with sitting in car, walks for long time -burning or sharp, left lateral -better with ibuprofen -seen 02/13/18 and treated with HEP for IT tightness, nsaids, OMM, referral to PT, imaging ordered and advise 1 month follow up - she was referred to sports medicine, but did not go - she also did not do the xray or the PT -returns today 3 months later -today reports: ongoing pain - though pain in lateral hip resolved with home treatments and ice - has pain in anterolat hip now (over asis); worse with sleeping on this side, better after up and around -denies:weakness, numbness, radiation, wt loss, fevers, malaise -wants to find out what is going on, wants to see ortho - Dr. Marlou Sa whom she has seen in the past for other issues   ROS: See pertinent positives and negatives per HPI.  Past Medical History:  Diagnosis Date  . Anal fissure   . Chest tightness 07/22/2012  . Constipation 01/05/2014  . Hyperplastic colon polyp   . Internal hemorrhoids   . Psoriasis    sees dermatologist  . Sinus bradycardia   . Varicose veins     Past Surgical History:  Procedure Laterality Date  . ANAL RECTAL MANOMETRY N/A 03/30/2016   Procedure: ANO RECTAL MANOMETRY;  Surgeon: Mauri Pole, MD;  Location: [facility];  Service: Endoscopy;  Laterality: N/A;  . CESAREAN SECTION     2 c sections  . ENDOVENOUS ABLATION SAPHENOUS VEIN W/ LASER Right 04-07-2015   endovenous laser ablation Right GSV by Curt Jews MD  . MOLE REMOVAL    . TUBAL LIGATION    . WISDOM TOOTH EXTRACTION      Family History  Problem Relation Age of Onset  . Heart disease Maternal Grandfather   . Stroke Maternal Grandfather   . Alcohol abuse Mother   . Hypertension Mother   . Alcohol abuse Father   . Varicose Veins Father   . Hypertension Maternal  Grandmother   . Heart disease Paternal Grandmother        AAA-  After age 18  . Colon cancer Neg Hx   . Stomach cancer Neg Hx     SOCIAL HX: see hpi   Current Outpatient Medications:  .  cetirizine (ZYRTEC) 10 MG tablet, Take 10 mg by mouth daily., Disp: , Rfl:  .  clobetasol ointment (TEMOVATE) 0.93 %, Apply 1 application topically as needed., Disp: , Rfl:  .  desonide (DESOWEN) 0.05 % cream, Apply topically 2 (two) times daily as needed. , Disp: , Rfl:  .  docusate sodium (COLACE) 100 MG capsule, Take 1 capsule by mouth daily as needed for constipation., Disp: , Rfl:  .  fluocinonide (LIDEX) 0.05 % external solution, Apply 1 application topically daily., Disp: , Rfl:   EXAM:  There were no vitals filed for this visit.  There is no height or weight on file to calculate BMI.  GENERAL: vitals reviewed and listed above, alert, oriented, appears well hydrated and in no acute distress  HEENT: atraumatic, conjunttiva clear, no obvious abnormalities on inspection of external nose and ears  NECK: no obvious masses on inspection  MS: moves all extremities without noticeable abnormality, normal gait, TTP over attach to ASIS L, no hip jt TTP, normal ext/int rotation hip, normal strength and functioning le bilat  PSYCH: pleasant and  cooperative, no obvious depression or anxiety  ASSESSMENT AND PLAN:  Discussed the following assessment and plan:  L hip pain  -we discussed possible serious and likely etiologies, workup and treatment, treatment risks and return precautions -after this discussion, Tamara [name] opted for referral to ortho for eval (prefers Dr. Marlou Sa - placed request in referral order) -follow up advised with ortho for this issue as planned. Quad stretches and symptomatic care in interim. -of course, we advised Tamara [name]  to return or notify a doctor immediately if symptoms worsen or persist or new concerns arise.  Patient Instructions  -We placed a referral for you as discussed  for this issue. It usually takes about 1-2 weeks to process and schedule this referral. If you have not heard from [place] regarding this appointment in 2 weeks please contact our office.     Lucretia Kern, DO

## 2018-05-13 ENCOUNTER — Other Ambulatory Visit: Payer: Self-pay | Admitting: Obstetrics and Gynecology

## 2018-05-13 DIAGNOSIS — Z1231 Encounter for screening mammogram for malignant neoplasm of breast: Secondary | ICD-10-CM

## 2018-05-26 ENCOUNTER — Encounter: Payer: Self-pay | Admitting: Cardiovascular Disease

## 2018-05-26 ENCOUNTER — Ambulatory Visit: Payer: BC Managed Care – PPO | Admitting: Cardiovascular Disease

## 2018-05-26 DIAGNOSIS — R002 Palpitations: Secondary | ICD-10-CM | POA: Diagnosis not present

## 2018-05-26 NOTE — Progress Notes (Signed)
Cardiology Office Note:    Date:  12/23/2776   ID:  Tamara [name], DOB [birth year], MRN [id number]  PCP:  Lucretia Kern, DO  Cardiologist:  Mertie Moores, MD   Referring MD: Lucretia Kern, DO   Chief Complaint  Patient presents with  . Bradycardia       Tamara [name] is a 43 y.o. female with a hx of palpitatoins  palps for the past year.  Tend to occur more in the evening .  Also has them in the am .   Has them weekly .  Not known to be hormonal .  Does not exercise.   Not related to exertional activities. Last for a few seconds   Has rare episodes of CP with exertion  Also has pain in her chest at rest.    No real associated CP or dyspnea.   Feels the need to take a deep breath  Has slow HR at night   Had a slow HR in high school Non smoker  + family hx  monther has HTN, Grandmother has HTN Mat. Grandfather had a CVA    Has had lots of fatigue  tsh has been normal    Works as a Psychologist, clinical at [employer]   May 26, 8674:  Tamara [name] is seen back today for follow-up visit.  She had a event monitor which revealed normal sinus rhythm and occasional premature ventricular contractions.  She had rare episodes of atrial tachycardia but these were nonsustained and not all that fast.  She had no arrhythmias to suggest an etiology for her syncope.  She seems to be slightly better   Past Medical History:  Diagnosis Date  . Anal fissure   . Chest tightness 07/22/2012  . Constipation 01/05/2014  . Hyperplastic colon polyp   . Internal hemorrhoids   . Psoriasis    sees dermatologist  . Sinus bradycardia   . Varicose veins     Past Surgical History:  Procedure Laterality Date  . ANAL RECTAL MANOMETRY N/A 03/30/2016   Procedure: ANO RECTAL MANOMETRY;  Surgeon: Mauri Pole, MD;  Location: [facility];  Service: Endoscopy;  Laterality: N/A;  . CESAREAN SECTION     2 c sections  . ENDOVENOUS ABLATION SAPHENOUS VEIN W/ LASER Right  04-07-2015   endovenous laser ablation Right GSV by Curt Jews MD  . MOLE REMOVAL    . TUBAL LIGATION    . WISDOM TOOTH EXTRACTION      Current Medications: Current Meds  Medication Sig  . cetirizine (ZYRTEC) 10 MG tablet Take 10 mg by mouth daily.  . clobetasol ointment (TEMOVATE) 1.95 % Apply 1 application topically as needed.  . desonide (DESOWEN) 0.05 % cream Apply topically 2 (two) times daily as needed.   . docusate sodium (COLACE) 100 MG capsule Take 1 capsule by mouth daily as needed for constipation.  . fluocinonide (LIDEX) 0.05 % external solution Apply 1 application topically daily.     Allergies:   Patient has no known allergies.   Social History   Socioeconomic History  . Marital status: Married    Spouse name: Not on file  . Number of children: 2  . Years of education: Not on file  . Highest education level: Not on file  Occupational History  . Occupation: Psychologist, clinical    Employer: [employer]  Social Needs  . Financial resource strain: Not on file  . Food insecurity:    Worry: Not on  file    Inability: Not on file  . Transportation needs:    Medical: Not on file    Non-medical: Not on file  Tobacco Use  . Smoking status: Never Smoker  . Smokeless tobacco: Never Used  Substance and Sexual Activity  . Alcohol use: No    Alcohol/week: 0.0 standard drinks  . Drug use: No  . Sexual activity: Yes    Partners: Male  Lifestyle  . Physical activity:    Days per week: Not on file    Minutes per session: Not on file  . Stress: Not on file  Relationships  . Social connections:    Talks on phone: Not on file    Gets together: Not on file    Attends religious service: Not on file    Active member of club or organization: Not on file    Attends meetings of clubs or organizations: Not on file    Relationship status: Not on file  Other Topics Concern  . 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 There is no history of Colon cancer or Stomach cancer.  ROS:   Please see the history of present illness.     All other systems reviewed and are negative.  EKGs/Labs/Other Studies Reviewed:    The following studies were reviewed today:   EKG:      Recent Labs: No results found for requested labs within last 8760 hours.  Recent Lipid Panel    Component Value Date/Time   CHOL 125 11/11/2017 0841   TRIG 78.0 11/11/2017 0841   HDL 48.60 11/11/2017 0841   CHOLHDL 3 11/11/2017 0841   VLDL 15.6 11/11/2017 0841   LDLCALC 60 11/11/2017 0841    Physical Exam: Blood pressure 104/78, pulse (!) 55, height 5' 5.5" (1.664 m), weight 176 lb (79.8 kg), SpO2 97 %.  GEN:  Well nourished, well developed in no acute distress HEENT: Normal NECK: No JVD; No carotid bruits LYMPHATICS: No lymphadenopathy CARDIAC: RRR   RESPIRATORY:  Clear to auscultation without rales, wheezing or rhonchi  ABDOMEN: Soft, non-tender, non-distended MUSCULOSKELETAL:  No edema; No deformity  SKIN: Warm and dry NEUROLOGIC:  Alert and oriented x 3   ASSESSMENT:    No diagnosis found. PLAN:       1. Palpitations:    Her monitor revealed frequent premature ventricular contractions and rare episodes of atrial tachycardia. She still fairly symptomatic. Reassured her that these appear to be all fairly benign. She states that she can occasionally feel her abdominal muscles contracting or perhaps can feel her abdominal aorta.  I think that this is  very unlikely to be an abdominal aortic aneurysm.  I will see her again in 1 year.  She is to call for further problems.    Medication Adjustments/Labs and Tests Ordered: Current medicines are reviewed at length with the patient today.  Concerns regarding medicines are outlined above.  No orders of the defined types  were placed in this encounter.  No orders of the defined types were placed in this encounter.   Patient Instructions  Medication Instructions:  Your physician recommends that you continue on your current medications as directed. Please refer to the Current Medication list given to you today.  If you need a refill on your cardiac medications before your next appointment, please call your pharmacy.    Lab work: None Ordered   Testing/Procedures: None Ordered   Follow-Up: At [facility], you and your health needs are our priority.  As part of our continuing mission to provide you with exceptional heart care, we have created designated Provider Care Teams.  These Care Teams include your primary Cardiologist (physician) and Advanced Practice Providers (APPs -  Physician Assistants and Nurse Practitioners) who all work together to provide you with the care you need, when you need it. You will need a follow up appointment in:  1 years.  Please call our office 2 months in advance to schedule this appointment.  You may see Mertie Moores, MD or one of the following Advanced Practice Providers on your designated Care Team: Richardson Dopp, PA-C [place], [place] . Daune Perch, NP      Signed, Mertie Moores, MD  05/26/2018 4:16 PM    [place]

## 2018-05-26 NOTE — Patient Instructions (Signed)

## 2018-05-28 ENCOUNTER — Ambulatory Visit (INDEPENDENT_AMBULATORY_CARE_PROVIDER_SITE_OTHER): Payer: Self-pay

## 2018-05-28 ENCOUNTER — Encounter (INDEPENDENT_AMBULATORY_CARE_PROVIDER_SITE_OTHER): Payer: Self-pay | Admitting: Orthopedic Surgery

## 2018-05-28 ENCOUNTER — Ambulatory Visit (INDEPENDENT_AMBULATORY_CARE_PROVIDER_SITE_OTHER): Payer: BC Managed Care – PPO | Admitting: Orthopedic Surgery

## 2018-05-28 DIAGNOSIS — M25552 Pain in left hip: Secondary | ICD-10-CM

## 2018-05-28 DIAGNOSIS — M545 Low back pain, unspecified: Secondary | ICD-10-CM

## 2018-05-28 NOTE — Progress Notes (Signed)
Office Visit Note   Patient: Tamara [name]           Date of Birth: Mar 19, 1976           MRN: [id number] Visit Date: 05/28/2018 Requested by: Tamara Kern, DO [address] [place], [place] [address] PCP: Tamara Kern, DO  Subjective: Chief Complaint  Patient presents with  . Left Hip - Pain    HPI: [name] is a patient with left hip pain.'s been going on for more than a year.  Denies any fall or injury.  Localizes the pain to the trochanteric region as well as the anterior superior iliac crest and wrapping around the inferior gluteal crease.  She is able to sleep on that left-hand side most of the time.  She denies any groin pain.  I reviewed her clinic note.  She has not had any prior physical therapy.  Aleve is helped some.  She states that when she walks with her friends it does hurt significantly the following day.              ROS: All systems reviewed are negative as they relate to the chief complaint within the history of present illness.  Patient denies  fevers or chills.   Assessment & Plan: Visit Diagnoses:  1. Acute left-sided low back pain, unspecified whether sciatica present   2. Pain of left hip joint     Plan: Impression is left hip pain which could be trochanteric bursitis versus meralgia paresthetica versus referred pain from the back.  Been going on for more than a year.  She is had failure of conservative management.  She is concerned that something occult may be going on.  Plan MRI left hip to evaluate trochanteric region pain.  If negative or normal then we will do trochanteric injection and formal physical therapy.  She is too painful at this time to do therapy for stretching.  I will see her back after that study  Follow-Up Instructions: Return for after MRI.   Orders:  Orders Placed This Encounter  Procedures  . XR Lumbar Spine 2-3 Views  . XR HIP UNILAT W OR W/O PELVIS 2-3 VIEWS LEFT  . MR Hip Left w/o contrast   No orders of the defined types  were placed in this encounter.     Procedures: No procedures performed   Clinical Data: No additional findings.  Objective: Vital Signs: There were no vitals taken for this visit.  Physical Exam:   Constitutional: Patient appears well-developed HEENT:  Head: Normocephalic Eyes:EOM are normal Neck: Normal range of motion Cardiovascular: Normal rate Pulmonary/chest: Effort normal Neurologic: Patient is alert Skin: Skin is warm Psychiatric: Patient has normal mood and affect    Ortho Exam: Ortho exam demonstrates no groin pain with internal X rotation of leg.  Palpable pedal pulses.  No nerve root tension signs.  No definite paresthesias particularly in the lateral femoral cutaneous nerve distribution.  Knee range of motion is normal.  Hip flexion and abduction and adduction strength is normal.  She does have some trochanteric tenderness on the left but not on the right.  Also some tenderness over the ASIS on the left but not the right.  Specialty Comments:  No specialty comments available.  Imaging: Xr Hip Unilat W Or W/o Pelvis 2-3 Views Left  Result Date: 05/28/2018 AP pelvis lateral left hip reviewed.  No fracture dislocation or bony abnormalities present.  Joint space maintained in the hip joint.  Sacroiliac joint  appears intact.  Normal left hip  Xr Lumbar Spine 2-3 Views  Result Date: 05/28/2018 AP lateral lumbar spine reviewed.  Sacroiliac joints intact.  Minimal degenerative changes between the vertebral bodies.  No spinal listhesis no compression fracture.  Hip joints appear normal.    PMFS History: Patient Active Problem List   Diagnosis Date Noted  . Palpitations 05/26/2018  . Varicose veins of leg with complications 63/33/5456  . Swelling of limb 05/05/2014  . RECTAL FISSURE - sees the gastroenterologist 07/23/2007  . PSORIASIS - sees Dr. Delman Cheadle in Dermatology 01/01/2007   Past Medical History:  Diagnosis Date  . Anal fissure   . Chest tightness  07/22/2012  . Constipation 01/05/2014  . Hyperplastic colon polyp   . Internal hemorrhoids   . Psoriasis    sees dermatologist  . Sinus bradycardia   . Varicose veins     Family History  Problem Relation Age of Onset  . Heart disease Maternal Grandfather   . Stroke Maternal Grandfather   . Alcohol abuse Mother   . Hypertension Mother   . Alcohol abuse Father   . Varicose Veins Father   . Hypertension Maternal Grandmother   . Heart disease Paternal Grandmother        AAA-  After age 53  . Colon cancer Neg Hx   . Stomach cancer Neg Hx     Past Surgical History:  Procedure Laterality Date  . ANAL RECTAL MANOMETRY N/A 03/30/2016   Procedure: ANO RECTAL MANOMETRY;  Surgeon: Mauri Pole, MD;  Location: [facility];  Service: Endoscopy;  Laterality: N/A;  . CESAREAN SECTION     2 c sections  . ENDOVENOUS ABLATION SAPHENOUS VEIN W/ LASER Right 04-07-2015   endovenous laser ablation Right GSV by Curt Jews MD  . MOLE REMOVAL    . TUBAL LIGATION    . WISDOM TOOTH EXTRACTION     Social History   Occupational History  . Occupation: Psychologist, clinical    Employer: [employer]  Tobacco Use  . Smoking status: Never Smoker  . Smokeless tobacco: Never Used  Substance and Sexual Activity  . Alcohol use: No    Alcohol/week: 0.0 standard drinks  . Drug use: No  . Sexual activity: Yes    Partners: Male

## 2018-06-03 ENCOUNTER — Ambulatory Visit
Admission: RE | Admit: 2018-06-03 | Discharge: 2018-06-03 | Disposition: A | Discharge status: Home / Self Care | Payer: BC Managed Care – PPO | Source: Ambulatory Visit | Attending: Orthopedic Surgery | Admitting: Orthopedic Surgery

## 2018-06-03 DIAGNOSIS — M545 Low back pain, unspecified: Secondary | ICD-10-CM

## 2018-06-03 DIAGNOSIS — M25552 Pain in left hip: Secondary | ICD-10-CM

## 2018-06-16 ENCOUNTER — Ambulatory Visit (INDEPENDENT_AMBULATORY_CARE_PROVIDER_SITE_OTHER): Payer: BC Managed Care – PPO | Admitting: Orthopedic Surgery

## 2018-06-16 ENCOUNTER — Encounter (INDEPENDENT_AMBULATORY_CARE_PROVIDER_SITE_OTHER): Payer: Self-pay | Admitting: Orthopedic Surgery

## 2018-06-16 DIAGNOSIS — M545 Low back pain, unspecified: Secondary | ICD-10-CM

## 2018-06-16 MED ORDER — DICLOFENAC SODIUM 2 % TD SOLN: 2.0000 g | Freq: Two times a day (BID) | TRANSDERMAL | 0 refills | Status: DC

## 2018-06-16 NOTE — Progress Notes (Signed)
Office Visit Note   Patient: Tamara [name]           Date of Birth: 04-23-1976           MRN: [id number] Visit Date: 06/16/2018 Requested by: Lucretia Kern, DO [address] [place], [place] [address] PCP: Lucretia Kern, DO  Subjective: Chief Complaint  Patient presents with  . Left Hip - Pain, Follow-up    HPI: Patient presents for follow-up of left hip.  Since of seen her she had an MRI scan which is reviewed.  That scan is normal.  She still localizes pain around the anterior superior iliac crest and trochanteric region.  Radiates around her gluteal fold into the buttock region.  Hard for her to stand up at times.  Denies any back pain or numbness and tingling.  Denies any groin pain.  She is been using ice.              ROS: All systems reviewed are negative as they relate to the chief complaint within the history of present illness.  Patient denies  fevers or chills.   Assessment & Plan: Visit Diagnoses:  1. Acute left-sided low back pain, unspecified whether sciatica present     Plan: Impression is normal MRI scan left hip.  Does not really look like referred pain from the back.  Difficult to really say what this is.  I think it could be part of trochanteric bursitis which is not really showing up on the scan.  She may also have some strain of the tensor off the anterior superior iliac crest.  No real numbness so meralgia paresthetica unlikely but still possible.  I think for now we should try some topical anti-inflammatories and physical therapy and follow-up with me as needed.  Follow-Up Instructions: Return if symptoms worsen or fail to improve.   Orders:  No orders of the defined types were placed in this encounter.  Meds ordered this encounter  Medications  . DISCONTD: Diclofenac Sodium 2 % SOLN    Sig: Take 2 g by mouth 2 (two) times daily.    Dispense:  100 g    Refill:  0  . Diclofenac Sodium 2 % SOLN    Sig: Take 2 g by mouth 2 (two) times daily.   Dispense:  100 g    Refill:  0      Procedures: No procedures performed   Clinical Data: No additional findings.  Objective: Vital Signs: There were no vitals taken for this visit.  Physical Exam:   Constitutional: Patient appears well-developed HEENT:  Head: Normocephalic Eyes:EOM are normal Neck: Normal range of motion Cardiovascular: Normal rate Pulmonary/chest: Effort normal Neurologic: Patient is alert Skin: Skin is warm Psychiatric: Patient has normal mood and affect    Ortho Exam: Ortho exam demonstrates normal gait alignment.  No real discrete tenderness over the trochanteric region or the gluteal region on the left.  Mild tenderness anterior superior iliac crest on the left.  Good hip flexion adduction abduction strength.  Specialty Comments:  No specialty comments available.  Imaging: No results found.   PMFS History: Patient Active Problem List   Diagnosis Date Noted  . Palpitations 05/26/2018  . Varicose veins of leg with complications 24/40/1027  . Swelling of limb 05/05/2014  . RECTAL FISSURE - sees the gastroenterologist 07/23/2007  . PSORIASIS - sees Dr. Delman Cheadle in Dermatology 01/01/2007   Past Medical History:  Diagnosis Date  . Anal fissure   . Chest  tightness 07/22/2012  . Constipation 01/05/2014  . Hyperplastic colon polyp   . Internal hemorrhoids   . Psoriasis    sees dermatologist  . Sinus bradycardia   . Varicose veins     Family History  Problem Relation Age of Onset  . Heart disease Maternal Grandfather   . Stroke Maternal Grandfather   . Alcohol abuse Mother   . Hypertension Mother   . Alcohol abuse Father   . Varicose Veins Father   . Hypertension Maternal Grandmother   . Heart disease Paternal Grandmother        AAA-  After age 18  . Colon cancer Neg Hx   . Stomach cancer Neg Hx     Past Surgical History:  Procedure Laterality Date  . ANAL RECTAL MANOMETRY N/A 03/30/2016   Procedure: ANO RECTAL MANOMETRY;  Surgeon:  Mauri Pole, MD;  Location: [facility];  Service: Endoscopy;  Laterality: N/A;  . CESAREAN SECTION     2 c sections  . ENDOVENOUS ABLATION SAPHENOUS VEIN W/ LASER Right 04-07-2015   endovenous laser ablation Right GSV by Curt Jews MD  . MOLE REMOVAL    . TUBAL LIGATION    . WISDOM TOOTH EXTRACTION     Social History   Occupational History  . Occupation: Psychologist, clinical    Employer: [employer]  Tobacco Use  . Smoking status: Never Smoker  . Smokeless tobacco: Never Used  Substance and Sexual Activity  . Alcohol use: No    Alcohol/week: 0.0 standard drinks  . Drug use: No  . Sexual activity: Yes    Partners: Male

## 2018-07-07 ENCOUNTER — Ambulatory Visit
Admission: RE | Admit: 2018-07-07 | Discharge: 2018-07-07 | Disposition: A | Discharge status: Home / Self Care | Payer: BC Managed Care – PPO | Source: Ambulatory Visit | Attending: Obstetrics and Gynecology | Admitting: Obstetrics and Gynecology

## 2018-07-07 DIAGNOSIS — Z1231 Encounter for screening mammogram for malignant neoplasm of breast: Secondary | ICD-10-CM

## 2018-07-09 ENCOUNTER — Other Ambulatory Visit: Payer: Self-pay

## 2018-07-09 ENCOUNTER — Other Ambulatory Visit: Payer: Self-pay | Admitting: Obstetrics and Gynecology

## 2018-07-09 ENCOUNTER — Ambulatory Visit: Payer: Self-pay | Admitting: *Deleted

## 2018-07-09 DIAGNOSIS — R928 Other abnormal and inconclusive findings on diagnostic imaging of breast: Secondary | ICD-10-CM

## 2018-07-09 MED ORDER — OSELTAMIVIR PHOSPHATE 75 MG PO CAPS: 75.0000 mg | ORAL_CAPSULE | Freq: Every day | ORAL | 0 refills | Status: DC

## 2018-07-09 NOTE — Telephone Encounter (Signed)
[facility] for tamiflu 75mg  daily x 7 days

## 2018-07-09 NOTE — Telephone Encounter (Signed)
Will send to Dr Ethlyn Gallery for recommendations as Dr Maudie Mercury is not in office today.

## 2018-07-09 NOTE — Telephone Encounter (Signed)
Rx has been sent in. Patient is aware. 

## 2018-07-09 NOTE — Telephone Encounter (Signed)
Patient's daughter dx with the influenza type B today. Patient is requesting tamiflu as prophylaxis. She does not have any symptoms currently, except mild headache today.  Pharmacy is [facility] on [address]. Reviewed advice care per protocol including education on how it is spread and prevention.  Routing to PCP for advice.  Reason for Disposition . [1] Influenza EXPOSURE within last 48 hours (2 days) AND [2] NOT HIGH RISK AND [3] strongly requests antiviral medication  Answer Assessment - Initial Assessment Questions 1. TYPE of EXPOSURE: "How were you exposed?" (e.g., close contact, not a close contact)     Today 2. DATE of EXPOSURE: "When did the exposure occur?" (e.g., hour, days, weeks)     Early this morning.  3. PREGNANCY: "Is there any chance you are pregnant?" "When was your last menstrual period?"     no 4. HIGH RISK for COMPLICATIONS: "Do you have any heart or lung problems? Do you have a weakened immune system?" (e.g., CHF, COPD, asthma, HIV positive, chemotherapy, renal failure, diabetes mellitus, sickle cell anemia)     no 5. SYMPTOMS: "Do you have any symptoms?" (e.g., cough, fever, sore throat, difficulty breathing).     Mild headache today. None other  Protocols used: INFLUENZA EXPOSURE-A-AH

## 2018-07-11 ENCOUNTER — Ambulatory Visit
Admission: RE | Admit: 2018-07-11 | Discharge: 2018-07-11 | Disposition: A | Discharge status: Home / Self Care | Payer: BC Managed Care – PPO | Source: Ambulatory Visit | Attending: Obstetrics and Gynecology | Admitting: Obstetrics and Gynecology

## 2018-07-11 DIAGNOSIS — R928 Other abnormal and inconclusive findings on diagnostic imaging of breast: Secondary | ICD-10-CM

## 2018-11-13 ENCOUNTER — Encounter: Payer: BC Managed Care – PPO | Admitting: Family Medicine

## 2018-12-04 ENCOUNTER — Ambulatory Visit: Payer: BC Managed Care – PPO | Admitting: Family Medicine

## 2018-12-05 ENCOUNTER — Ambulatory Visit: Payer: BC Managed Care – PPO | Admitting: Gastroenterology

## 2019-05-05 ENCOUNTER — Other Ambulatory Visit: Payer: Self-pay | Admitting: *Deleted

## 2019-05-05 ENCOUNTER — Telehealth: Payer: Self-pay | Admitting: *Deleted

## 2019-05-05 DIAGNOSIS — I83811 Varicose veins of right lower extremities with pain: Secondary | ICD-10-CM

## 2019-05-05 NOTE — Telephone Encounter (Signed)
Returning phone message from Mrs. Delange.  She is an established [facility] patient who had an endovenous laser ablation of right greater saphenous vein on 04-07-2015 by Dr. Donnetta Hutching.  She states that since summer 2020 she has been experiencing right leg pain and swelling.  She has been using heating pad and taking Ibuprofen as needed for pain.  Suggested that she elevate her legs when sitting and restart wearing thigh high compression hose 20-30 mm HG.  Will send request to [facility] schedulers to make a new vein appointment (she has not been seen in 4 years) with Dr. Scot Dock or Dr. Oneida Alar with a venous reflux study (right leg, order in Epic).

## 2019-05-19 ENCOUNTER — Telehealth: Payer: Self-pay | Admitting: *Deleted

## 2019-06-02 ENCOUNTER — Other Ambulatory Visit: Payer: Self-pay | Admitting: Obstetrics and Gynecology

## 2019-06-02 DIAGNOSIS — Z1231 Encounter for screening mammogram for malignant neoplasm of breast: Secondary | ICD-10-CM

## 2019-06-24 ENCOUNTER — Encounter (HOSPITAL_COMMUNITY): Payer: BC Managed Care – PPO

## 2019-06-24 ENCOUNTER — Encounter: Payer: BC Managed Care – PPO | Admitting: Vascular Surgery

## 2019-06-28 IMAGING — MG 2D DIGITAL DIAGNOSTIC BILATERAL MAMMOGRAM WITH CAD AND ADJUNCT T
9 of 12 series · 9 of 28 positions shown · non-contrast
Comparison: Previous exam(s).

CLINICAL DATA: 41-year-old female with diffuse, intermittent
lateral left breast pain for 4-5 months.

EXAM:
2D DIGITAL DIAGNOSTIC BILATERAL MAMMOGRAM WITH CAD AND ADJUNCT TOMO

[R CC]
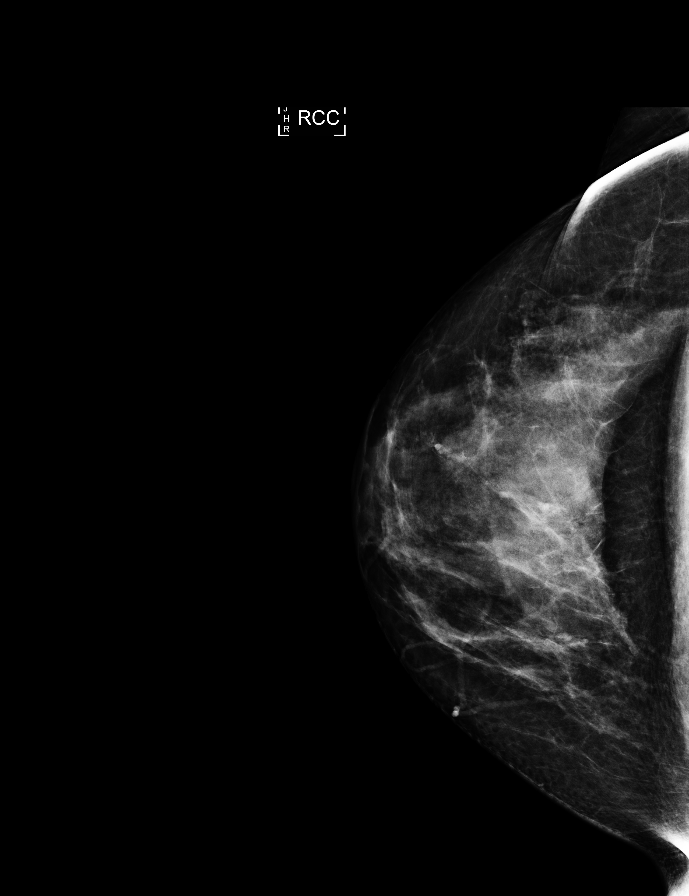

[L CC synth-2D]
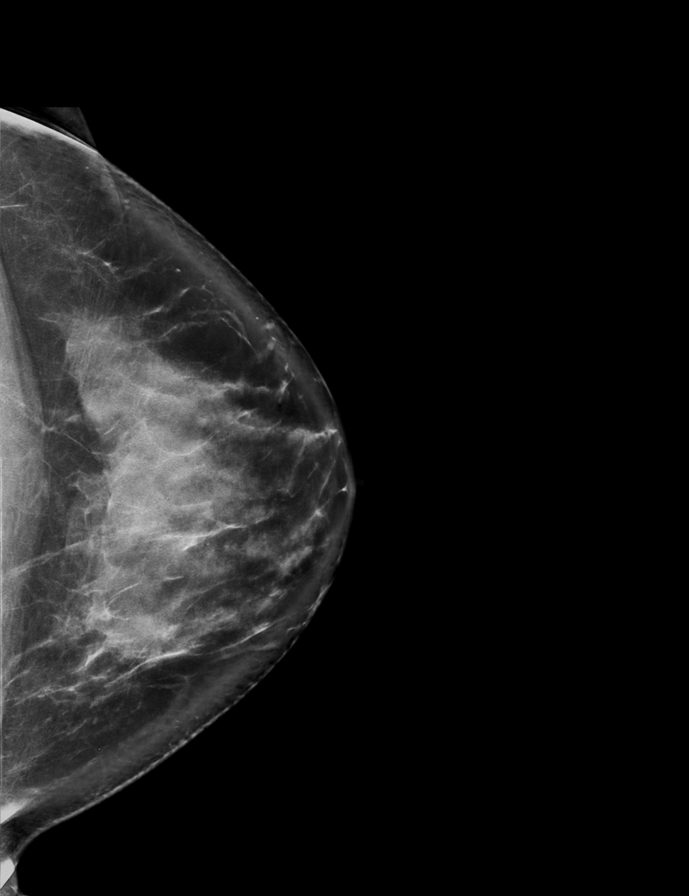

[R MLO synth-2D]
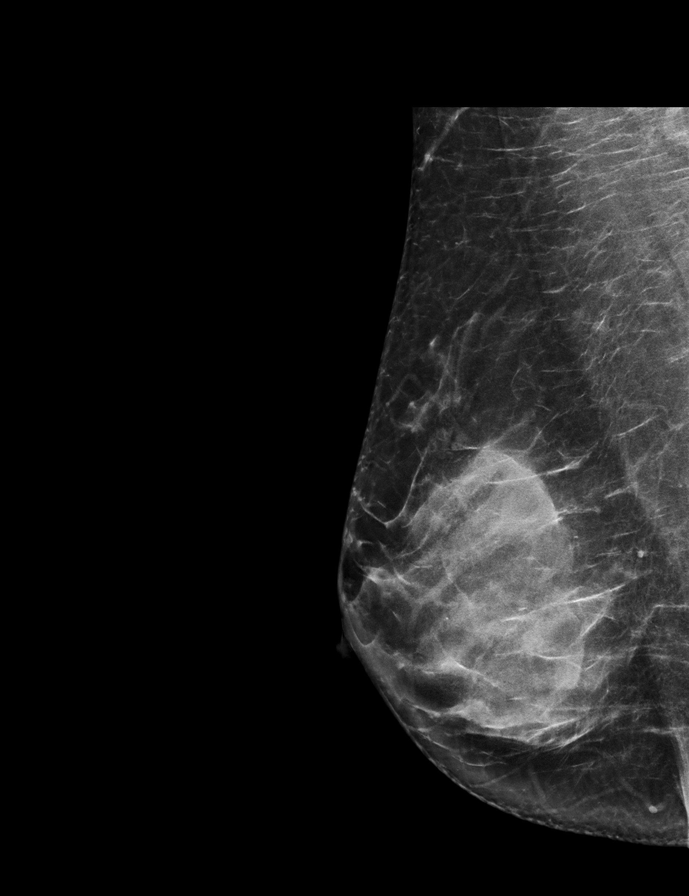

[R CC synth-2D]
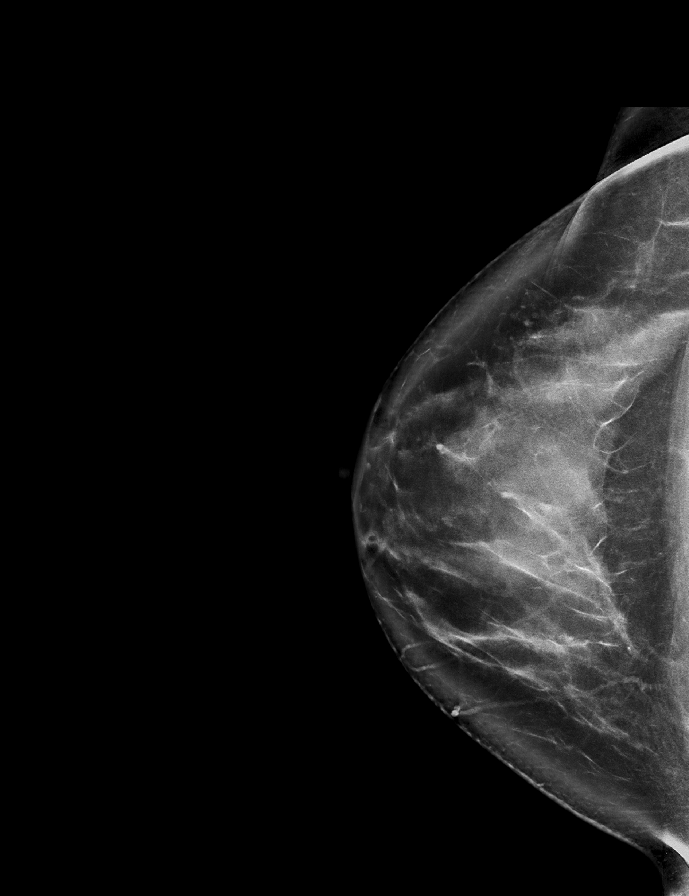

[L CC]
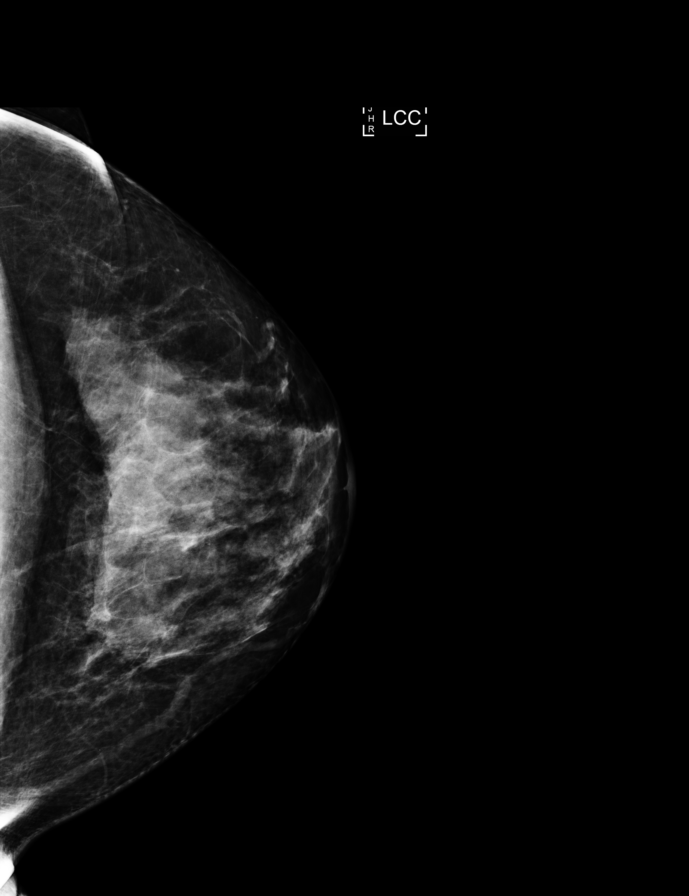

[L MLO synth-2D]
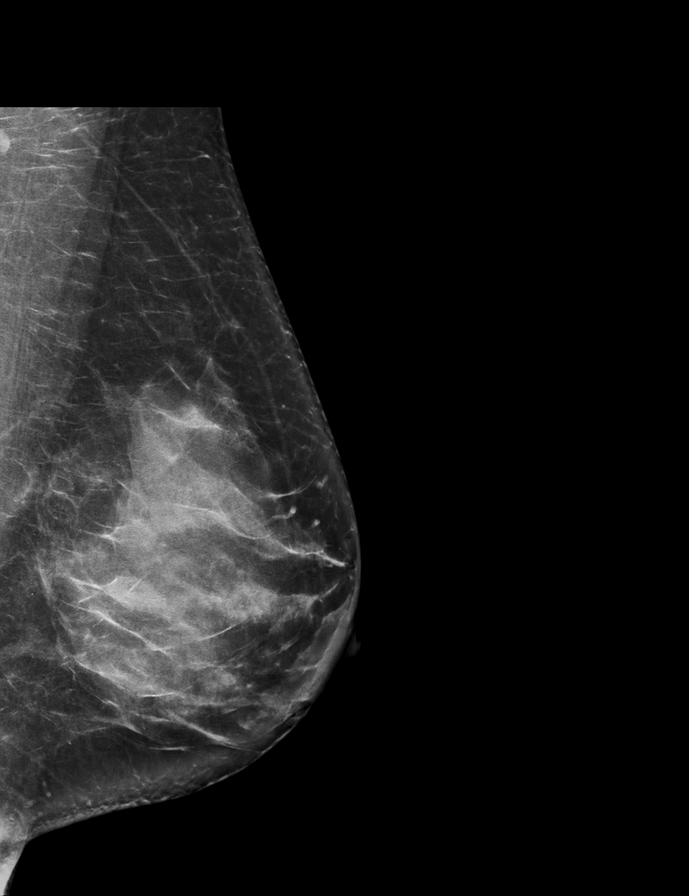

[L MLO]
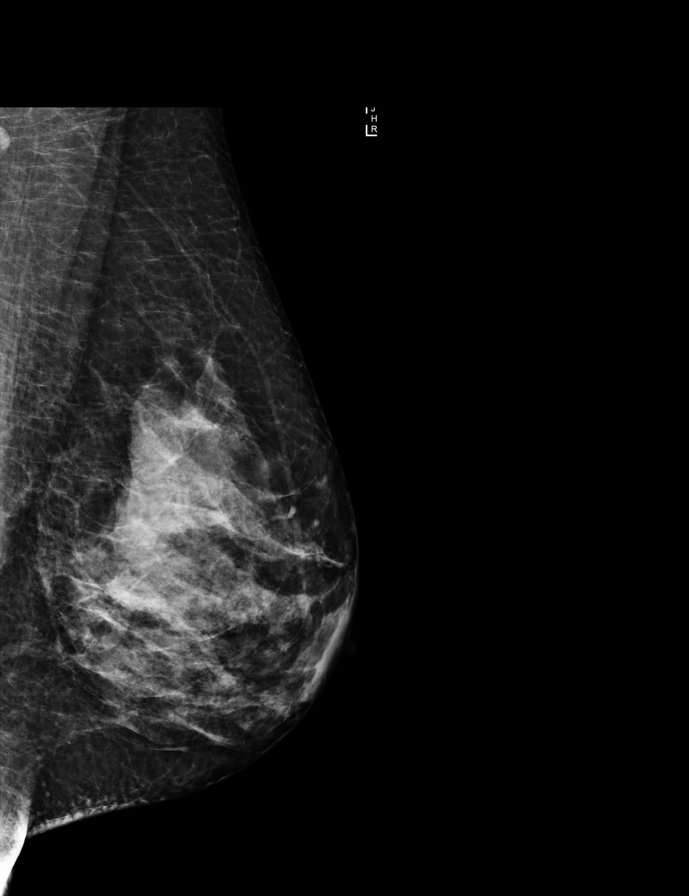

[R MLO]
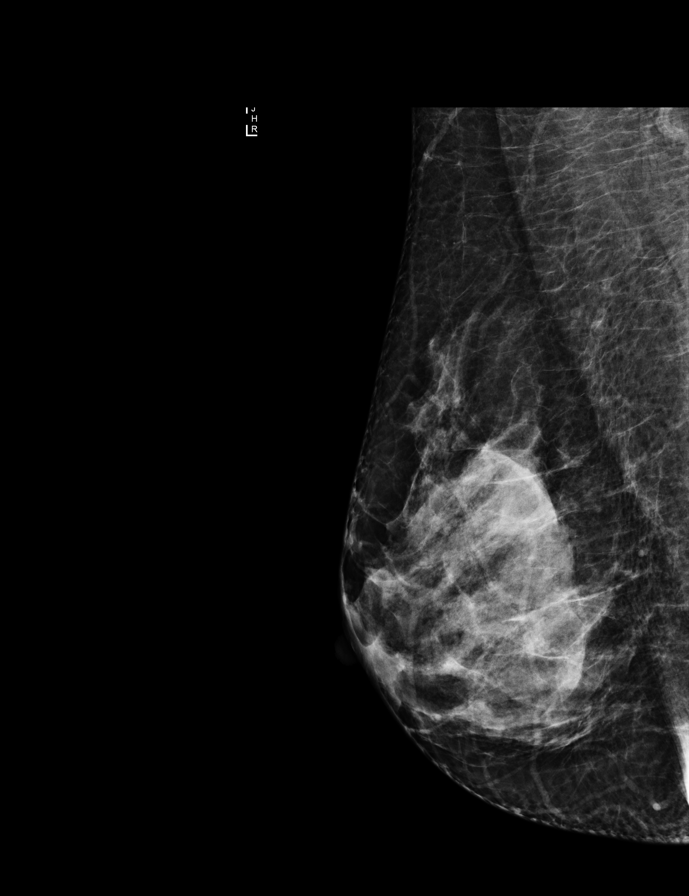

[R MLO tomo · tomo slice 39/78.0]
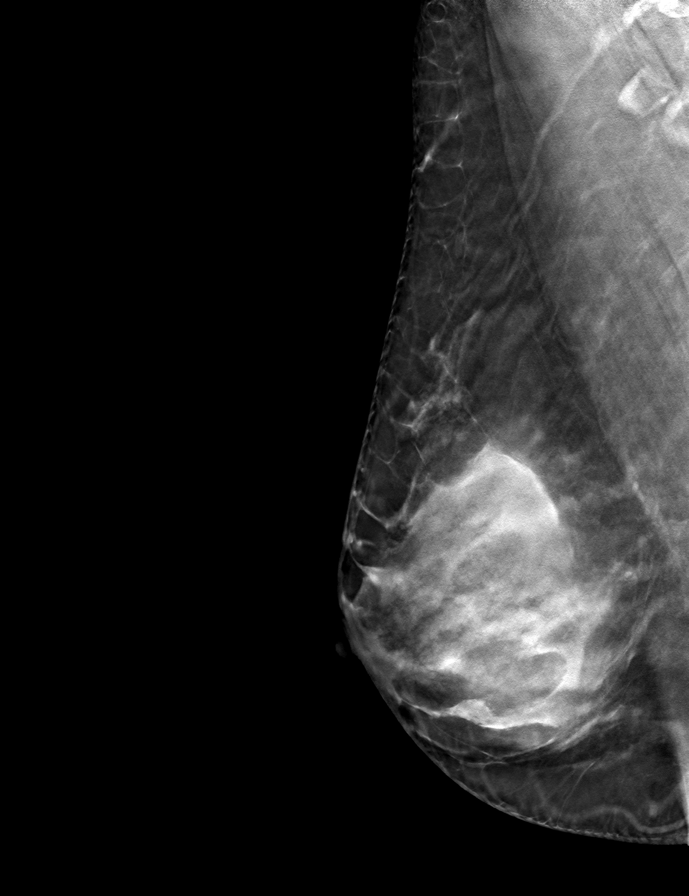

[9 of 28 positions shown; findings below may reference images not displayed]

ACR Breast Density Category d: The breast tissue is extremely dense,
which lowers the sensitivity of mammography.
FINDINGS: No suspicious mammographic findings are identified in either breast.
The parenchymal pattern is stable.

Mammographic images were processed with CAD.
IMPRESSION: No mammographic evidence of malignancy in either breast.

RECOMMENDATION:
1. Clinical follow-up recommended for the painful area of concern in
the left breast. Any further workup should be based on clinical
grounds. Patient was encouraged to follow-up with referring
physician if pain became localized and persistent or if a palpable
lump/mass developed.
2.  Screening mammogram in one year.(Code:[id number])

I have discussed the findings and recommendations with the patient.
Results were also provided in writing at the conclusion of the
visit. If applicable, a reminder letter will be sent to the patient
regarding the next appointment.

BI-RADS CATEGORY  1: Negative.

## 2019-07-10 ENCOUNTER — Encounter (HOSPITAL_COMMUNITY): Payer: BC Managed Care – PPO

## 2019-07-10 ENCOUNTER — Encounter: Payer: BC Managed Care – PPO | Admitting: Vascular Surgery

## 2019-07-13 ENCOUNTER — Ambulatory Visit
Admission: RE | Admit: 2019-07-13 | Discharge: 2019-07-13 | Disposition: A | Discharge status: Home / Self Care | Payer: BC Managed Care – PPO | Source: Ambulatory Visit | Attending: Obstetrics and Gynecology | Admitting: Obstetrics and Gynecology

## 2019-07-13 ENCOUNTER — Other Ambulatory Visit: Payer: Self-pay

## 2019-07-13 DIAGNOSIS — Z1231 Encounter for screening mammogram for malignant neoplasm of breast: Secondary | ICD-10-CM

## 2019-07-15 ENCOUNTER — Encounter: Payer: Self-pay | Admitting: Vascular Surgery

## 2019-07-15 ENCOUNTER — Ambulatory Visit: Payer: BC Managed Care – PPO | Admitting: Vascular Surgery

## 2019-07-15 ENCOUNTER — Ambulatory Visit (HOSPITAL_COMMUNITY)
Admission: RE | Admit: 2019-07-15 | Discharge: 2019-07-15 | Disposition: A | Discharge status: Home / Self Care | Payer: BC Managed Care – PPO | Source: Ambulatory Visit | Attending: Surgery | Admitting: Surgery

## 2019-07-15 ENCOUNTER — Other Ambulatory Visit: Payer: Self-pay

## 2019-07-15 VITALS — BP 98/63 | HR 54 | Temp 97.3°F | Resp 18 | Ht 65.5 in | Wt 180.5 lb

## 2019-07-15 DIAGNOSIS — M79604 Pain in right leg: Secondary | ICD-10-CM | POA: Diagnosis not present

## 2019-07-15 DIAGNOSIS — I83811 Varicose veins of right lower extremities with pain: Secondary | ICD-10-CM

## 2019-07-15 DIAGNOSIS — M7989 Other specified soft tissue disorders: Secondary | ICD-10-CM

## 2019-07-15 NOTE — Progress Notes (Signed)
Patient is a 44 year old female who presents today with complaints of right leg pain.  She underwent laser ablation of her right greater saphenous vein by Dr. Donnetta Hutching approximately 4 5 years ago.  She did get some relief of her symptoms at that point.  About 6 or 7 months ago she began to have pain and swelling in her right leg.  The pain would get worse after standing on her legs all day long.  She will get some relief with elevation of the leg and ibuprofen.  However, some mornings she wakes up in the leg is still painful to her.  She does have mild chronic swelling in the right leg which is slightly larger than the left.  She does not really know how long this has been going on.  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  She has not really noticed any pain primarily over these.  She is a non-smoker.  She does not describe any back pain or joint pain.  Past Medical History:  Diagnosis Date  . Anal fissure   . Chest tightness 07/22/2012  . Constipation 01/05/2014  . Hyperplastic colon polyp   . Internal hemorrhoids   . Psoriasis    sees dermatologist  . Sinus bradycardia   . Varicose veins    Current Outpatient Medications on File Prior to Visit  Medication Sig Dispense Refill  . cetirizine (ZYRTEC) 10 MG tablet Take 10 mg by mouth daily.    . clobetasol ointment (TEMOVATE) [id number] % Apply 1 application topically as needed.    . desonide (DESOWEN) 0.05 % cream Apply topically 2 (two) times daily as needed.     . docusate sodium (COLACE) 100 MG capsule Take 1 capsule by mouth daily as needed for constipation.    . fluocinonide (LIDEX) 0.05 % external solution Apply 1 application topically daily.    . Diclofenac Sodium 2 % SOLN Take 2 g by mouth 2 (two) times daily. (Patient not taking: Reported on 07/15/2019) 100 g 0  . oseltamivir (TAMIFLU) 75 MG capsule Take 1 capsule (75 mg total) by mouth daily. (Patient not taking: Reported on 07/15/2019) 7 capsule 0   No  current facility-administered medications on file prior to visit.   Physical exam:  Vitals:   07/15/19 1340  BP: 98/63  Pulse: (!) 54  Resp: 18  Temp: (!) 97.3 F (36.3 C)  TempSrc: Temporal  SpO2: 100%  Weight: 180 lb 8 oz (81.9 kg)  Height: 5' 5.5" (1.664 m)    Extremities: Mild slightly pitting edema right lower extremity about 3 to 5% larger than the left lower extremity  Skin: Varicosities over the right posterior thigh just above the knee about 3 to 4 mm diameter covering a surface area of about 10 cm, no ulceration  Vascular: 2+ dorsalis pedis posterior tibial pulses bilaterally  Data: Patient had a venous duplex exam performed today which showed no evidence of DVT.  There was reflux in the right common femoral vein and the right lesser saphenous vein.  Lesser saphenous vein was 3 mm diameter  Assessment: Right leg pain and swelling.  She does not have any evidence of recurrent cannulization of her greater saphenous vein.  She does have a few scattered varicosities in the posterior thigh but I do not believe that these are completely responsible for the pain in her right leg which is mainly calf pain.  She does have some deep vein reflux that may be contributing  to her symptoms.  Plan: Recommended the patient that she begin wearing her compression stockings again for symptomatic relief.  She was fitted for these in our office today.  No obvious etiology for her current right leg symptoms although potentially this is related to her deep vein reflux.  If her symptoms do not resolve other musculoskeletal etiologies could be entertained.  She will follow-up with [place] on an as-needed basis.  Ruta Hinds, MD [facility] Office: [phone]

## 2019-07-18 ENCOUNTER — Ambulatory Visit: Payer: BC Managed Care – PPO | Attending: Internal Medicine

## 2019-07-18 DIAGNOSIS — Z23 Encounter for immunization: Secondary | ICD-10-CM | POA: Insufficient documentation

## 2019-07-18 NOTE — Progress Notes (Signed)
   Covid-19 Vaccination Clinic  Name:  Tamara [name]    MRN: [id number] DOB: 03-Nov-1975  07/18/2019  [name] was observed post Covid-19 immunization for 15 minutes without incidence. She was provided with Vaccine Information Sheet and instruction to access the V-Safe system.   [name] was instructed to call 911 with any severe reactions post vaccine: Marland Kitchen Difficulty breathing  . Swelling of your face and throat  . A fast heartbeat  . A bad rash all over your body  . Dizziness and weakness    Immunizations Administered    Name Date Dose VIS Date Route   Pfizer COVID-19 Vaccine 07/18/2019 10:16 AM 0.3 mL 05/01/2019 Intramuscular   Manufacturer: [place]   Lot: [id number]   [place]: [id number]

## 2019-08-08 ENCOUNTER — Ambulatory Visit: Payer: BC Managed Care – PPO

## 2019-08-11 ENCOUNTER — Ambulatory Visit: Payer: BC Managed Care – PPO | Attending: Internal Medicine

## 2019-08-11 DIAGNOSIS — Z23 Encounter for immunization: Secondary | ICD-10-CM

## 2019-08-11 NOTE — Progress Notes (Signed)
   Covid-19 Vaccination Clinic  Name:  Tamara [name]    MRN: [id number] DOB: [birth year]  08/11/2019  [name] was observed post Covid-19 immunization for 15 minutes without incident. She was provided with Vaccine Information Sheet and instruction to access the V-Safe system.   [name] was instructed to call 911 with any severe reactions post vaccine: Marland Kitchen Difficulty breathing  . Swelling of face and throat  . A fast heartbeat  . A bad rash all over body  . Dizziness and weakness   Immunizations Administered    Name Date Dose VIS Date Route   Pfizer COVID-19 Vaccine 08/11/2019  8:51 AM 0.3 mL 05/01/2019 Intramuscular   Manufacturer: [place]   Lot: [id number]   [place]: [id number]

## 2019-08-12 ENCOUNTER — Ambulatory Visit: Payer: BC Managed Care – PPO

## 2019-11-18 ENCOUNTER — Telehealth: Payer: Self-pay | Admitting: Family Medicine

## 2019-11-18 NOTE — Telephone Encounter (Addendum)
Patient is calling and requesting to Tamara [name] from Dr. Maudie Mercury to Dr. Bryan Lemma. Informed patient that Dr. Loletha Grayer is out of the office this week and request may or may not be approved until she returns. CB is [phone]

## 2019-11-18 NOTE — Telephone Encounter (Signed)
Please disregard message. Provider is PRN. Patient has already been scheduled with Dr. Loletha Grayer.

## 2019-12-31 ENCOUNTER — Other Ambulatory Visit: Payer: Self-pay | Admitting: Gynecology

## 2019-12-31 DIAGNOSIS — N644 Mastodynia: Secondary | ICD-10-CM

## 2020-01-18 ENCOUNTER — Other Ambulatory Visit: Payer: Self-pay

## 2020-01-18 ENCOUNTER — Ambulatory Visit
Admission: RE | Admit: 2020-01-18 | Discharge: 2020-01-18 | Disposition: A | Discharge status: Home / Self Care | Payer: BC Managed Care – PPO | Source: Ambulatory Visit | Attending: Gynecology | Admitting: Gynecology

## 2020-01-18 DIAGNOSIS — N644 Mastodynia: Secondary | ICD-10-CM

## 2020-01-27 ENCOUNTER — Encounter: Payer: BC Managed Care – PPO | Admitting: Family Medicine

## 2020-04-25 ENCOUNTER — Other Ambulatory Visit: Payer: Self-pay

## 2020-04-27 ENCOUNTER — Ambulatory Visit: Payer: BC Managed Care – PPO | Admitting: Family Medicine

## 2020-04-27 ENCOUNTER — Other Ambulatory Visit: Payer: Self-pay

## 2020-04-27 ENCOUNTER — Encounter: Payer: Self-pay | Admitting: Family Medicine

## 2020-04-27 VITALS — BP 116/74 | HR 62 | Temp 97.3°F | Ht 65.5 in | Wt 180.0 lb

## 2020-04-27 DIAGNOSIS — G8929 Other chronic pain: Secondary | ICD-10-CM

## 2020-04-27 DIAGNOSIS — M25551 Pain in right hip: Secondary | ICD-10-CM

## 2020-04-27 DIAGNOSIS — M25552 Pain in left hip: Secondary | ICD-10-CM | POA: Diagnosis not present

## 2020-04-27 NOTE — Patient Instructions (Signed)
[email or web address]

## 2020-04-27 NOTE — Progress Notes (Signed)
Tamara [name] is a 44 y.o. female  Chief Complaint  Patient presents with  . Establish Care    TOC- bilateral hip x 2 years.  she has taken Ibuprofe/Aleve, Meloxicam and used a heating pad.      HPI: Tamara [name] is a 44 y.o. female previously seen by Dr. Colin Benton who presents today to establish care with our office. Pt complains of B/L hip pain x 2 years. She has taken ibuprofen and meloxicam, used a heating pad without significant improvement or resolution.  Pain worse after sitting for long periods of time, also hard to lay on her side. Improves and resolves with walking around.  She saw rheum in 10/2019 for positive ANA. xrays done and showed only minimal spondylosis at L3-L4 with endplate spurring.  Prior to rheum she saw ortho Dr. Marlou Sa who pt states told her he couldn't explain it and referred her to PT. She states co-pay was cost-prohibitive so she has not done this.  She is seeing a chiropractor for LBP.   Past Medical History:  Diagnosis Date  . Anal fissure   . Chest tightness 07/22/2012  . Constipation 01/05/2014  . Hyperplastic colon polyp   . Internal hemorrhoids   . Psoriasis    sees dermatologist  . Sinus bradycardia   . Varicose veins     Past Surgical History:  Procedure Laterality Date  . ANAL RECTAL MANOMETRY N/A 03/30/2016   Procedure: ANO RECTAL MANOMETRY;  Surgeon: Mauri Pole, MD;  Location: [facility];  Service: Endoscopy;  Laterality: N/A;  . CESAREAN SECTION     2 c sections  . ENDOVENOUS ABLATION SAPHENOUS VEIN W/ LASER Right 04-07-2015   endovenous laser ablation Right [facility] by Curt Jews MD  . MOLE REMOVAL    . TUBAL LIGATION    . WISDOM TOOTH EXTRACTION      Social History   Socioeconomic History  . Marital status: Married    Spouse name: Not on file  . Number of children: 2  . Years of education: Not on file  . Highest education level: Not on file  Occupational History  . Occupation: Psychologist, clinical    Employer:  [employer]  Tobacco Use  . Smoking status: Never Smoker  . Smokeless tobacco: Never Used  Vaping Use  . Vaping Use: Never used  Substance and Sexual Activity  . Alcohol use: No    Alcohol/week: 0.0 standard drinks  . Drug use: No  . Sexual activity: Yes    Partners: Male  Other Topics Concern  . Not on file  Social History Narrative   ork or School: Psychologist, clinical      Home Situation: lives with husband and son and daughter and granmother      Spiritual Beliefs: Christian      Lifestyle: no regular CV exercise; diet is good         Social Determinants of Radio broadcast assistant Strain:   . Difficulty of Paying Living Expenses: Not on file  Food Insecurity:   . Worried About Charity fundraiser in the Last Year: Not on file  . Ran Out of Food in the Last Year: Not on file  Transportation Needs:   . Lack of Transportation (Medical): Not on file  . Lack of Transportation (Non-Medical): Not on file  Physical Activity:   . Days of Exercise per Week: Not on file  . Minutes of Exercise per Session: Not on file  Stress:   . Feeling of Stress : Not on file  Social Connections:   . Frequency of Communication with Friends and Family: Not on file  . Frequency of Social Gatherings with Friends and Family: Not on file  . Attends Religious Services: Not on file  . Active Member of Clubs or Organizations: Not on file  . Attends Archivist Meetings: Not on file  . Marital Status: Not on file  Intimate Partner Violence:   . Fear of Current or Ex-Partner: Not on file  . Emotionally Abused: Not on file  . Physically Abused: Not on file  . Sexually Abused: Not on file    Family History  Problem Relation Age of Onset  . Heart disease Maternal Grandfather   . Stroke Maternal Grandfather   . Alcohol abuse Mother   . Hypertension Mother   . Alcohol abuse Father   . Varicose Veins Father   . Hypertension Maternal Grandmother   . Breast cancer  Maternal Grandmother   . Heart disease Paternal Grandmother        AAA-  After age 51  . Colon cancer Neg Hx   . Stomach cancer Neg Hx      Immunization History  Administered Date(s) Administered  . Influenza-Unspecified 02/12/2014, 02/18/2018, 03/15/2020  . PFIZER SARS-COV-2 Vaccination 07/18/2019, 08/11/2019, 04/23/2020  . Tdap 10/03/2011    Outpatient Encounter Medications as of 04/27/2020  Medication Sig  . cetirizine (ZYRTEC) 10 MG tablet Take 10 mg by mouth daily.  . clobetasol ointment (TEMOVATE) 4.31 % Apply 1 application topically as needed.  . desonide (DESOWEN) 0.05 % cream Apply topically 2 (two) times daily as needed.   . docusate sodium (COLACE) 100 MG capsule Take 1 capsule by mouth daily as needed for constipation.  . fluocinonide (LIDEX) 0.05 % external solution Apply 1 application topically daily.  . fluticasone (FLONASE) 50 MCG/ACT nasal spray fluticasone propionate 50 mcg/actuation nasal spray,suspension  USE 2 SPRAY(S) IN EACH NOSTRIL ONCE DAILY  . [DISCONTINUED] Diclofenac Sodium 2 % SOLN Take 2 g by mouth 2 (two) times daily. (Patient not taking: Reported on 07/15/2019)  . [DISCONTINUED] oseltamivir (TAMIFLU) 75 MG capsule Take 1 capsule (75 mg total) by mouth daily. (Patient not taking: Reported on 07/15/2019)   No facility-administered encounter medications on file as of 04/27/2020.     ROS: Pertinent positives and negatives noted in HPI. Remainder of ROS non-contributory    No Known Allergies  BP 116/74   Pulse 62   Temp (!) 97.3 F (36.3 C) (Temporal)   Ht 5' 5.5" (1.664 m)   Wt 180 lb (81.6 kg)   SpO2 96%   BMI 29.50 kg/m   Physical Exam Constitutional:      Appearance: Normal appearance.  Pulmonary:     Effort: No respiratory distress.  Neurological:     General: No focal deficit present.     Mental Status: She is alert and oriented to person, place, and time.     Motor: No weakness.     Coordination: Coordination normal.     Gait:  Gait normal.  Psychiatric:        Mood and Affect: Mood normal.        Behavior: Behavior normal.      A/P:  1. Chronic hip pain, bilateral - symptoms x 2 years - saw PCP, ortho, rheum - minimal improvement with NSAIDs (ibuprofen, meloxicam), heating pad - referred to PT but has not gone yet - Ambulatory referral to Physical  Medicine Rehab    This visit occurred during the SARS-CoV-2 public health emergency.  Safety protocols were in place, including screening questions prior to the visit, additional usage of staff PPE, and extensive cleaning of exam room while observing appropriate contact time as indicated for disinfecting solutions.

## 2020-04-28 ENCOUNTER — Ambulatory Visit: Payer: BC Managed Care – PPO | Admitting: Sports Medicine

## 2020-04-28 ENCOUNTER — Encounter: Payer: Self-pay | Admitting: Sports Medicine

## 2020-04-28 DIAGNOSIS — L603 Nail dystrophy: Secondary | ICD-10-CM

## 2020-04-28 DIAGNOSIS — R200 Anesthesia of skin: Secondary | ICD-10-CM | POA: Diagnosis not present

## 2020-04-28 NOTE — Patient Instructions (Signed)
Vinegar soaks 1 cup of white distilled vinegar to 8 cups of warm water.  Soak 20 mins. May repeat soak two times per week.  If there is thickness or discoloration to nails may file nails after soaks or after bath/shower with nail file and apply tea tree oil. Apply oil daily to nails after filing for the best result.  For tennis shoes recommend:  Kandy Garrison [employer]is New balance Saucony Can be purchased at [employer] or [employer] arch fit Can be purchased at any major retailers  Vionic  SAS Can be purchased at [employer] or [employer]   For work shoes recommend: [employer] Work [employer] Can be purchased at a variety of places or Engineer, maintenance (IT)   For casual shoes recommend: Vionic  Can be purchased at [employer] or [employer]   For Over the [employer] recommend: Power Steps Can be purchased in office/[employer] Can be purchased at [employer] or [employer] Can be purchased at [employer]

## 2020-04-28 NOTE — Progress Notes (Signed)
  Subjective: Tamara [name] is a 44 y.o. female patient seen today in office with complaint of mildly painful left first toenail that started about 1 month ago after she had a pedicure patient states that she is worried that her fungus is returning and she is also struggling with some tingling in and around her toenail.  Patient was treated with Lamisil previously and also had a previous partial avulsion.  Patient denies any redness warmth swelling drainage or any other symptoms at this time.  Review of Systems  All other systems reviewed and are negative.    Patient Active Problem List   Diagnosis Date Noted  . Palpitations 05/26/2018  . Varicose veins of leg with complications 16/02/9603  . Swelling of limb 05/05/2014  . RECTAL FISSURE - sees the gastroenterologist 07/23/2007  . PSORIASIS - sees Dr. Delman Cheadle in Dermatology 01/01/2007    Current Outpatient Medications on File Prior to Visit  Medication Sig Dispense Refill  . cetirizine (ZYRTEC) 10 MG tablet Take 10 mg by mouth daily.    . clobetasol ointment (TEMOVATE) 5.40 % Apply 1 application topically as needed.    . desonide (DESOWEN) 0.05 % cream Apply topically 2 (two) times daily as needed.    . docusate sodium (COLACE) 100 MG capsule Take 1 capsule by mouth daily as needed for constipation.    . fluocinonide (LIDEX) 0.05 % external solution Apply 1 application topically daily.    . fluticasone (FLONASE) 50 MCG/ACT nasal spray fluticasone propionate 50 mcg/actuation nasal spray,suspension  USE 2 SPRAY(S) IN EACH NOSTRIL ONCE DAILY     No current facility-administered medications on file prior to visit.    No Known Allergies  Objective: Physical Exam  General: Well developed, nourished, no acute distress, awake, alert and oriented x 3  Vascular: Dorsalis pedis artery 2/4 bilateral, Posterior tibial artery 2/4 bilateral, skin temperature warm to warm proximal to distal bilateral lower extremities, no varicosities, pedal  hair present bilateral.  Neurological: Gross sensation present via light touch bilateral.  Subjective tingling to left first toe.  Dermatological: Skin is warm, dry, and supple bilateral, Nails 1-10 are normal length distally and left great toenail there is very minimal subungual debris and very minimal brittle appearance to nail, minimal discoloration, no open lesions present bilateral, no callus/corns/hyperkeratotic tissue present bilateral. No signs of infection bilateral.  Musculoskeletal: No boney deformities noted bilateral. Muscular strength within normal limits without painon range of motion. No pain with calf compression bilateral.  Assessment and Plan:  Problem List Items Addressed This Visit   None   Visit Diagnoses    Nail dystrophy    -  Primary   Numbness of toes       left 1st toe, tingling      -Examined patient -Discussed treatment options for painful dystrophic nail -At this time changes to the left first toenail are very minimal and the distal portion Recommend for patient to try tea tree oil and soaking with vinegar as instructed -Advised patient to monitor tingling or numb feeling to left first toe and may try topical lidocaine ointment as needed if painful -Patient to return in 8 weeks for follow up evaluation if no better at next visit we will proceed with a fungal culture.  Landis Martins, DPM

## 2020-05-04 ENCOUNTER — Encounter: Payer: Self-pay | Admitting: Physical Medicine and Rehabilitation

## 2020-05-25 ENCOUNTER — Encounter: Payer: Self-pay | Admitting: Gastroenterology

## 2020-05-25 ENCOUNTER — Ambulatory Visit: Payer: BC Managed Care – PPO | Admitting: Gastroenterology

## 2020-05-25 VITALS — BP 102/70 | HR 56 | Ht 65.25 in | Wt 184.0 lb

## 2020-05-25 DIAGNOSIS — R110 Nausea: Secondary | ICD-10-CM

## 2020-05-25 DIAGNOSIS — K625 Hemorrhage of anus and rectum: Secondary | ICD-10-CM

## 2020-05-25 DIAGNOSIS — L290 Pruritus ani: Secondary | ICD-10-CM | POA: Diagnosis not present

## 2020-05-25 DIAGNOSIS — R1031 Right lower quadrant pain: Secondary | ICD-10-CM

## 2020-05-25 MED ORDER — ONDANSETRON HCL 4 MG PO TABS: 4.0000 mg | ORAL_TABLET | Freq: Three times a day (TID) | ORAL | 1 refills | Status: DC | PRN

## 2020-05-25 MED ORDER — FAMOTIDINE 20 MG PO TABS: 20.0000 mg | ORAL_TABLET | Freq: Every day | ORAL | 1 refills | Status: DC

## 2020-05-25 MED ORDER — PLENVU 140 G PO SOLR: ORAL | 0 refills | Status: DC

## 2020-05-25 NOTE — Patient Instructions (Addendum)
You have been scheduled for an abdominal ultrasound at [facility] Radiology (1st floor of hospital) on 1/12/202 at 8:30AM. Please arrive 15 minutes prior to your appointment for registration. Make certain not to have anything to eat or drink AFTER MIDNIGHT prior to your appointment. Should you need to reschedule your appointment, please contact radiology at [phone]. This test typically takes about 30 minutes to perform.  You have been scheduled for an endoscopy and colonoscopy. Please follow the written instructions given to you at your visit today. Please pick up your prep supplies at the pharmacy within the next 1-3 days. (Plenvu) If you use inhalers (even only as needed), please bring them with you on the day of your procedure.   Due to recent changes in healthcare laws, you may see the results of your imaging and laboratory studies on MyChart before your provider has had a chance to review them.  We understand that in some cases there may be results that are confusing or concerning to you. Not all laboratory results come back in the same time frame and the provider may be waiting for multiple results in order to interpret others.  Please give [place] 48 hours in order for your provider to thoroughly review all the results before contacting the office for clarification of your results.   We will send Pepcid and Zofran to your pharmacy  I appreciate the  opportunity to care for you  Thank You   Marsa Aris , MD

## 2020-05-25 NOTE — Progress Notes (Signed)
Tamara [name]    [id number]    [birth year]  Primary Care Physician:Cirigliano, Garvin Fila, DO  Referring Physician: Ronnald Nian, DO [place],  [place] [address]   Chief complaint: Right lower quadrant abdominal pain HPI:  45 year old very pleasant female here for evaluation of right lower quadrant abdominal pain She has intermittent symptoms and abdominal pain with no relationship to diet, menstrual cycle or bowel movements.  She has very minimal spotting during menstrual cycle and is irregular, is unsure if there is any relationship to the pain.  She is currently undergoing work-up for possible endometriosis and she was recommended by her gynecologist to have endoscopic evaluation to exclude any other etiology for her pain.  She continues to have anal itching and discomfort intermittently.  She is s/p hemorrhoidal band ligation  Colonoscopy March 2012 showed small internal hemorrhoids and 2 small hyperplastic polyps were removed  Denies any weight loss or decreased appetite.  No vomiting, melena or rectal bleeding. She has intermittent nausea and indigestion  Outpatient Encounter Medications as of 05/25/2020  Medication Sig  . cetirizine (ZYRTEC) 10 MG tablet Take 10 mg by mouth daily.  . clobetasol ointment (TEMOVATE) [id number] % Apply 1 application topically as needed.  . desonide (DESOWEN) 0.05 % cream Apply topically 2 (two) times daily as needed.  . docusate sodium (COLACE) 100 MG capsule Take 1 capsule by mouth daily as needed for constipation.  . fluocinonide (LIDEX) 0.05 % external solution Apply 1 application topically daily.  . fluticasone (FLONASE) 50 MCG/ACT nasal spray fluticasone propionate 50 mcg/actuation nasal spray,suspension  USE 2 SPRAY(S) IN EACH NOSTRIL ONCE DAILY   No facility-administered encounter medications on file as of 05/25/2020.    Allergies as of 05/25/2020  . (No Known Allergies)    Past Medical History:   Diagnosis Date  . Anal fissure   . Chest tightness 07/22/2012  . Constipation 01/05/2014  . Hyperplastic colon polyp   . Hyperplastic colon polyp   . Internal hemorrhoids   . Psoriasis    sees dermatologist  . Sinus bradycardia   . Varicose veins     Past Surgical History:  Procedure Laterality Date  . ANAL RECTAL MANOMETRY N/A 03/30/2016   Procedure: ANO RECTAL MANOMETRY;  Surgeon: Tamara Pole, MD;  Location: [facility];  Service: Endoscopy;  Laterality: N/A;  . CESAREAN SECTION     2 c sections  . ENDOVENOUS ABLATION SAPHENOUS VEIN W/ LASER Right 04-07-2015   endovenous laser ablation Right GSV by Tamara Jews MD  . MOLE REMOVAL    . TUBAL LIGATION    . WISDOM TOOTH EXTRACTION      Family History  Problem Relation Age of Onset  . Heart disease Maternal Grandfather   . Stroke Maternal Grandfather   . Alcohol abuse Mother   . Hypertension Mother   . Heart disease Mother   . Alcohol abuse Father   . Varicose Veins Father   . Hypertension Maternal Grandmother   . Breast cancer Maternal Grandmother   . Colon polyps Maternal Grandmother   . Heart disease Maternal Grandmother   . Heart disease Paternal Grandmother        AAA-  After age 2  . Congestive Heart Failure Paternal Grandmother   . Colon cancer Neg Hx   . Stomach cancer Neg Hx     Social History   Socioeconomic History  . Marital status: Married    Spouse  name: Not on file  . Number of children: 2  . Years of education: Not on file  . Highest education level: Not on file  Occupational History  . Occupation: Warehouse manager    Employer: [name]  Tobacco Use  . Smoking status: Never Smoker  . Smokeless tobacco: Never Used  Vaping Use  . Vaping Use: Never used  Substance and Sexual Activity  . Alcohol use: No    Alcohol/week: 0.0 standard drinks  . Drug use: No  . Sexual activity: Yes    Partners: Male  Other Topics Concern  . Not on file  Social History Narrative    ork or School: Warehouse manager      Home Situation: lives with husband and son and daughter and granmother      Spiritual Beliefs: Christian      Lifestyle: no regular CV exercise; diet is good         Social Determinants of Corporate investment banker Strain: Not on file  Food Insecurity: Not on file  Transportation Needs: Not on file  Physical Activity: Not on file  Stress: Not on file  Social Connections: Not on file  Intimate Partner Violence: Not on file      Review of systems: All other review of systems negative except as mentioned in the HPI.   Physical Exam: Vitals:   05/25/20 1440  BP: 102/70  Pulse: (!) 56   Body mass index is 30.39 kg/m. Gen:      No acute distress HEENT:  sclera anicteric Abd:      soft, non-tender; no palpable masses, no distension Ext:    No edema Neuro: alert and oriented x 3 Psych: normal mood and affect  Data Reviewed:  Reviewed labs, radiology imaging, old records and pertinent past GI work up   Assessment and Plan/Recommendations:  45 year old very pleasant female with complaints of right-sided abdominal pain, dyspepsia and nausea intermittently  Will schedule for EGD and colonoscopy for further evaluation, exclude Crohn's disease or IBD.  Peptic ulcer disease or gastroduodenitis also in the differential as well as possible endometriosis.  She has positive family history of endometriosis.  Obtain abdominal ultrasound to exclude gallstones or nephrolithiasis  Start Pepcid 20 mg twice daily as needed for possible GERD causing dyspepsia and nausea Use Zofran 4 mg daily as needed for severe nausea Antireflux measures  Return after EGD and colonoscopy The risks and benefits as well as alternatives of endoscopic procedure(s) have been discussed and reviewed. All questions answered. The patient agrees to proceed.   The patient was provided an opportunity to ask questions and all were answered. The patient  agreed with the plan and demonstrated an understanding of the instructions.  Tamara [name] , MD    CC: Tamara Mam, DO

## 2020-05-31 ENCOUNTER — Other Ambulatory Visit: Payer: Self-pay

## 2020-05-31 ENCOUNTER — Other Ambulatory Visit: Payer: Self-pay | Admitting: Obstetrics and Gynecology

## 2020-05-31 DIAGNOSIS — Z1231 Encounter for screening mammogram for malignant neoplasm of breast: Secondary | ICD-10-CM

## 2020-06-01 ENCOUNTER — Ambulatory Visit (HOSPITAL_COMMUNITY): Payer: BC Managed Care – PPO

## 2020-06-07 ENCOUNTER — Encounter: Payer: Self-pay | Admitting: Gastroenterology

## 2020-06-08 ENCOUNTER — Other Ambulatory Visit: Payer: Self-pay

## 2020-06-08 ENCOUNTER — Ambulatory Visit (HOSPITAL_COMMUNITY)
Admission: RE | Admit: 2020-06-08 | Discharge: 2020-06-08 | Disposition: A | Discharge status: Home / Self Care | Payer: BC Managed Care – PPO | Source: Ambulatory Visit | Attending: Gastroenterology | Admitting: Gastroenterology

## 2020-06-08 DIAGNOSIS — K625 Hemorrhage of anus and rectum: Secondary | ICD-10-CM | POA: Insufficient documentation

## 2020-06-08 DIAGNOSIS — L290 Pruritus ani: Secondary | ICD-10-CM | POA: Insufficient documentation

## 2020-06-08 DIAGNOSIS — R110 Nausea: Secondary | ICD-10-CM | POA: Insufficient documentation

## 2020-06-08 DIAGNOSIS — R1031 Right lower quadrant pain: Secondary | ICD-10-CM | POA: Diagnosis present

## 2020-06-20 ENCOUNTER — Encounter: Payer: Self-pay | Admitting: Physical Medicine and Rehabilitation

## 2020-06-20 ENCOUNTER — Other Ambulatory Visit: Payer: Self-pay

## 2020-06-20 ENCOUNTER — Encounter
Payer: BC Managed Care – PPO | Attending: Physical Medicine and Rehabilitation | Admitting: Physical Medicine and Rehabilitation

## 2020-06-20 VITALS — BP 108/72 | HR 56 | Temp 97.8°F | Ht 65.0 in | Wt 183.0 lb

## 2020-06-20 DIAGNOSIS — M7061 Trochanteric bursitis, right hip: Secondary | ICD-10-CM | POA: Diagnosis present

## 2020-06-20 DIAGNOSIS — M7062 Trochanteric bursitis, left hip: Secondary | ICD-10-CM | POA: Diagnosis present

## 2020-06-20 MED ORDER — DICLOFENAC SODIUM 75 MG PO TBEC: 75.0000 mg | DELAYED_RELEASE_TABLET | Freq: Two times a day (BID) | ORAL | 3 refills | Status: DC | PRN

## 2020-06-20 NOTE — Patient Instructions (Signed)
Pt is a 45 yr old female no major medical hx except psoriasis,  Here for B/L hip pain.  Has B/L trochanteric bursitis   1.Will get prior auth for B/L trochanteric bursa steroid injections.   2. Once they kick in and pain calmed down, then will send to outpt PT ~ 2days/week to get IT band less tight/ help control cause of bursitis.   3. Until can get steroid injections- will try Diclofenac 75 mg 2x/day as needed   4. Not allowed per insurance requirements to do steroid injections today, cause insurance requires a prior [place].   5. F/U asap for injections.

## 2020-06-20 NOTE — Progress Notes (Signed)
Subjective:    Patient ID: Tamara [name], female    DOB: Mar 18, 1976, 45 y.o.   MRN: [id number]  HPI  Pt is a 45 yr old female no major medical hx except psoriasis,  Here for B/L hip pain.    Started in L hip but now in R hip as well.   "hip bones" are tender-  If sitting for a long period of time- stiff.  Doesn't hurt after she's "walked it out".   Can't sit on floor anymore/no matter what position. Worse after car trips- for any major length of time.   Got MRI 2 years ago- thinks the Tenderness is maybe a little better- but difficulty sitting is about the same.   Started going to chiropractor-   Tried: Aleve- qday for awhile ibuprofen prn   Inventory Average Pain 5 Pain Right Now 3 My pain is intermittent, dull and aching  In the last 24 hours, has pain interfered with the following? General activity 0 Relation with others 0 Enjoyment of life 2 What TIME of day is your pain at its worst? varies Sleep (in general) Good  Pain is worse with: sitting Pain improves with: heat/ice Relief from Meds: 2  Do you have any goals in this area?  no  Do you have any goals in this area?  no  No problems in this area  new pt  new pt    Family History  Problem Relation Age of Onset   Heart disease Maternal Grandfather    Stroke Maternal Grandfather    Alcohol abuse Mother    Hypertension Mother    Heart disease Mother    Alcohol abuse Father    Varicose Veins Father    Hypertension Maternal Grandmother    Breast cancer Maternal Grandmother    Colon polyps Maternal Grandmother    Heart disease Maternal Grandmother    Heart disease Paternal Grandmother        AAA-  After age 81   Congestive Heart Failure Paternal Grandmother    Colon cancer Neg Hx    Stomach cancer Neg Hx    Social History   Socioeconomic History   Marital status: Married    Spouse name: Not on file   Number of children: 2   Years of education: Not on file   Highest  education level: Not on file  Occupational History   Occupation: Psychologist, clinical    Employer: [employer]  Tobacco Use   Smoking status: Never Smoker   Smokeless tobacco: Never Used  Scientific laboratory technician Use: Never used  Substance and Sexual Activity   Alcohol use: No    Alcohol/week: 0.0 standard drinks   Drug use: No   Sexual activity: Yes    Partners: Male  Other Topics Concern   Not on file  Social History Narrative   ork or School: Psychologist, clinical      Home Situation: lives with husband and son and daughter and granmother      Spiritual Beliefs: Christian      Lifestyle: no regular CV exercise; diet is good         Social Determinants of Radio broadcast assistant Strain: Not on file  Food Insecurity: Not on file  Transportation Needs: Not on file  Physical Activity: Not on file  Stress: Not on file  Social Connections: Not on file   Past Surgical History:  Procedure Laterality Date   ANAL RECTAL MANOMETRY N/A 03/30/2016  Procedure: ANO RECTAL MANOMETRY;  Surgeon: Mauri Pole, MD;  Location: [facility];  Service: Endoscopy;  Laterality: N/A;   CESAREAN SECTION     2 c sections   ENDOVENOUS ABLATION SAPHENOUS VEIN W/ LASER Right 04-07-2015   endovenous laser ablation Right GSV by Curt Jews MD   MOLE REMOVAL     TUBAL LIGATION     WISDOM TOOTH EXTRACTION     Past Medical History:  Diagnosis Date   Anal fissure    Chest tightness 07/22/2012   Constipation 01/05/2014   Hyperplastic colon polyp    Hyperplastic colon polyp    Internal hemorrhoids    Psoriasis    sees dermatologist   Sinus bradycardia    Varicose veins    BP 108/72    Pulse (!) 56    Temp 97.8 F (36.6 C)    Ht 5\' 5"  (1.651 m)    Wt 183 lb (83 kg)    SpO2 99%    BMI 30.45 kg/m   Opioid Risk Score:   Fall Risk Score:  `1  Depression screen PHQ 2/9  Depression screen [facility] 2/9 06/20/2020 04/27/2020 11/11/2017 04/12/2015  12/28/2014 11/24/2014 11/17/2014  Decreased Interest 0 0 0 0 0 0 0  Down, Depressed, Hopeless 0 0 0 0 0 0 0  PHQ - 2 Score 0 0 0 0 0 0 0  Altered sleeping 1 - - - - - -  Tired, decreased energy 3 - - - - - -  Change in appetite 0 - - - - - -  Feeling bad or failure about yourself  0 - - - - - -  Trouble concentrating 0 - - - - - -  Moving slowly or fidgety/restless 0 - - - - - -  Suicidal thoughts 0 - - - - - -  PHQ-9 Score 4 - - - - - -  Difficult doing work/chores Somewhat difficult - - - - - -    Review of Systems  Musculoskeletal:       Hip pain  All other systems reviewed and are negative.      Objective:   Physical Exam  Awake, alert, appropriate, using gown, NAD VERY TTP over B/L trochanteric bursae Very tight IT bands B/L       Assessment & Plan:   Pt is a 45 yr old female no major medical hx except psoriasis,  Here for B/L hip pain.  Has B/L trochanteric bursitis   1.Will get prior auth for B/L trochanteric bursa steroid injections. Discussed with office mgr, not allowed to do without prior auth  2. Once they kick in and pain calmed down, then will send to outpt PT ~ 2days/week to get IT band less tight/ help control cause of bursitis.   3. Until can get steroid injections- will try Diclofenac 75 mg 2x/day as needed   4. Not allowed per insurance requirements to do steroid injections today, cause insurance requires a prior [place].   5. F/U asap for injections.   I spent a total of 35 minutes on visit- as detailed above.

## 2020-06-30 ENCOUNTER — Other Ambulatory Visit: Payer: Self-pay

## 2020-06-30 ENCOUNTER — Encounter: Payer: Self-pay | Admitting: Gastroenterology

## 2020-06-30 ENCOUNTER — Ambulatory Visit (AMBULATORY_SURGERY_CENTER): Payer: BC Managed Care – PPO | Admitting: Gastroenterology

## 2020-06-30 VITALS — BP 133/82 | HR 52 | Temp 97.5°F | Resp 15 | Ht 65.0 in | Wt 184.0 lb

## 2020-06-30 DIAGNOSIS — K635 Polyp of colon: Secondary | ICD-10-CM

## 2020-06-30 DIAGNOSIS — R1031 Right lower quadrant pain: Secondary | ICD-10-CM

## 2020-06-30 DIAGNOSIS — K648 Other hemorrhoids: Secondary | ICD-10-CM | POA: Diagnosis not present

## 2020-06-30 DIAGNOSIS — K2970 Gastritis, unspecified, without bleeding: Secondary | ICD-10-CM | POA: Diagnosis not present

## 2020-06-30 DIAGNOSIS — R1011 Right upper quadrant pain: Secondary | ICD-10-CM

## 2020-06-30 DIAGNOSIS — R110 Nausea: Secondary | ICD-10-CM

## 2020-06-30 DIAGNOSIS — K625 Hemorrhage of anus and rectum: Secondary | ICD-10-CM

## 2020-06-30 DIAGNOSIS — D125 Benign neoplasm of sigmoid colon: Secondary | ICD-10-CM

## 2020-06-30 MED ORDER — SODIUM CHLORIDE 0.9 % IV SOLN: 500.0000 mL | Freq: Once | INTRAVENOUS | Status: DC

## 2020-06-30 MED ORDER — OMEPRAZOLE 20 MG PO CPDR: 20.0000 mg | DELAYED_RELEASE_CAPSULE | Freq: Every day | ORAL | 3 refills | Status: DC

## 2020-06-30 NOTE — Progress Notes (Signed)
VS- Suzanne Hodges RN 

## 2020-06-30 NOTE — Op Note (Signed)
[facility] Patient Name: Tamara [name] Procedure Date: 06/30/2020 2:07 PM MRN: [id number] Endoscopist: Mauri Pole , MD Age: 45 Referring MD:  Date of Birth: Oct 12, 1975 Gender: Female Account #: [id number] Procedure:                Upper GI endoscopy Indications:              Dyspepsia, Suspected esophageal reflux, Nausea Medicines:                Monitored Anesthesia Care Procedure:                Pre-Anesthesia Assessment:                           - Prior to the procedure, a History and Physical                            was performed, and patient medications and                            allergies were reviewed. The patient's tolerance of                            previous anesthesia was also reviewed. The risks                            and benefits of the procedure and the sedation                            options and risks were discussed with the patient.                            All questions were answered, and informed consent                            was obtained. Prior Anticoagulants: The patient has                            taken no previous anticoagulant or antiplatelet                            agents. ASA Grade Assessment: II - A patient with                            mild systemic disease. After reviewing the risks                            and benefits, the patient was deemed in                            satisfactory condition to undergo the procedure.                           After obtaining informed consent, the endoscope was  passed under direct vision. Throughout the                            procedure, the patient's blood pressure, pulse, and                            oxygen saturations were monitored continuously. The                            Endoscope was introduced through the mouth, and                            advanced to the second part of duodenum. The upper                            GI  endoscopy was accomplished without difficulty.                            The patient tolerated the procedure well. Scope In: Scope Out: Findings:                 LA Grade A (one or more mucosal breaks less than 5                            mm, not extending between tops of 2 mucosal folds)                            esophagitis with no bleeding was found 34 to 37 cm                            from the incisors. Biopsies were taken with a cold                            forceps for histology.                           The gastroesophageal flap valve was visualized                            endoscopically and classified as Hill Grade III                            (minimal fold, loose to endoscope, hiatal hernia                            likely).                           The stomach was normal.                           The cardia and gastric fundus were normal on  retroflexion.                           Patchy mildly erythematous mucosa was found in the                            duodenal bulb. Biopsies were taken with a cold                            forceps for histology. Complications:            No immediate complications. Estimated Blood Loss:     Estimated blood loss was minimal. Impression:               - LA Grade A reflux esophagitis with no bleeding.                            Biopsied.                           - Gastroesophageal flap valve classified as Hill                            Grade III (minimal fold, loose to endoscope, hiatal                            hernia likely).                           - Normal stomach.                           - Erythematous duodenopathy. Biopsied. Recommendation:           - Resume previous diet.                           - Continue present medications.                           - Await pathology results.                           - Follow an antireflux regimen.                           - Use Prilosec  (omeprazole) 20 mg PO daily X 90                            tabs with 3 refills.                           - Return to GI office in 3 months. Please call to                            schedule appointment. Mauri Pole, MD 06/30/2020 3:00:44 PM This report has been signed electronically.

## 2020-06-30 NOTE — Op Note (Signed)
[facility] Patient Name: Tamara [name] Procedure Date: 06/30/2020 2:26 PM MRN: [id number] Endoscopist: Mauri Pole , MD Age: 45 Referring MD:  Date of Birth: 22-Nov-1975 Gender: Female Account #: [id number] Procedure:                Colonoscopy Indications:              Evaluation of unexplained GI bleeding presenting                            with Hematochezia, Abdominal pain in the right                            lower quadrant, Abdominal pain in the right upper                            quadrant Medicines:                Monitored Anesthesia Care Procedure:                Pre-Anesthesia Assessment:                           - Prior to the procedure, a History and Physical                            was performed, and patient medications and                            allergies were reviewed. The patient's tolerance of                            previous anesthesia was also reviewed. The risks                            and benefits of the procedure and the sedation                            options and risks were discussed with the patient.                            All questions were answered, and informed consent                            was obtained. Prior Anticoagulants: The patient has                            taken no previous anticoagulant or antiplatelet                            agents. ASA Grade Assessment: II - A patient with                            mild systemic disease. After reviewing the risks  and benefits, the patient was deemed in                            satisfactory condition to undergo the procedure.                           After obtaining informed consent, the colonoscope                            was passed under direct vision. Throughout the                            procedure, the patient's blood pressure, pulse, and                            oxygen saturations were monitored continuously. The                             Olympus PFC-H190DL [id number]) Colonoscope was                            introduced through the anus and advanced to the the                            terminal ileum, with identification of the                            appendiceal orifice and IC valve. The colonoscopy                            was performed without difficulty. The patient                            tolerated the procedure well. The quality of the                            bowel preparation was excellent. The terminal                            ileum, ileocecal valve, appendiceal orifice, and                            rectum were photographed. Scope In: 2:27:56 PM Scope Out: 2:47:52 PM Scope Withdrawal Time: 0 hours 10 minutes 53 seconds  Total Procedure Duration: 0 hours 19 minutes 56 seconds  Findings:                 The perianal and digital rectal examinations were                            normal.                           The terminal ileum appeared normal.  A 2 mm polyp was found in the sigmoid colon. The                            polyp was sessile. The polyp was removed with a                            cold biopsy forceps. Resection and retrieval were                            complete.                           Normal mucosa was found in the sigmoid colon, in                            the descending colon, in the transverse colon, in                            the ascending colon, in the cecum, at the                            appendiceal orifice and at the ileocecal valve.                           A patchy area of mildly erythematous mucosa was                            found in the rectum. Biopsies were taken with a                            cold forceps for histology.                           Non-bleeding external and internal hemorrhoids were                            found during retroflexion. The hemorrhoids were                             small. Post hemorrhoidal band ligation, healthy                            appearing scar Complications:            No immediate complications. Estimated Blood Loss:     Estimated blood loss was minimal. Impression:               - The examined portion of the ileum was normal.                           - One 2 mm polyp in the sigmoid colon, removed with                            a cold biopsy forceps. Resected and retrieved.                           -  Normal mucosa in the sigmoid colon, in the                            descending colon, in the transverse colon, in the                            ascending colon, in the cecum, at the appendiceal                            orifice and at the ileocecal valve.                           - Erythematous mucosa in the rectum. Biopsied.                           - Non-bleeding external and internal hemorrhoids. Recommendation:           - Patient has a contact number available for                            emergencies. The signs and symptoms of potential                            delayed complications were discussed with the                            patient. Return to normal activities tomorrow.                            Written discharge instructions were provided to the                            patient.                           - Resume previous diet.                           - Continue present medications.                           - Await pathology results.                           - Repeat colonoscopy in 10 years for surveillance                            based on pathology results. Mauri Pole, MD 06/30/2020 2:54:44 PM This report has been signed electronically.

## 2020-06-30 NOTE — Patient Instructions (Signed)
YOU HAD AN ENDOSCOPIC PROCEDURE TODAY AT [facility]:   Refer to the procedure report that was given to you for any specific questions about what was found during the examination.  If the procedure report does not answer your questions, please call your gastroenterologist to clarify.  If you requested that your care partner not be given the details of your procedure findings, then the procedure report has been included in a sealed envelope for you to review at your convenience later.  **Handouts given on ployps and hemorrhoids**  YOU SHOULD EXPECT: Some feelings of bloating in the abdomen. Passage of more gas than usual.  Walking can help get rid of the air that was put into your GI tract during the procedure and reduce the bloating. If you had a lower endoscopy (such as a colonoscopy or flexible sigmoidoscopy) you may notice spotting of blood in your stool or on the toilet paper. If you underwent a bowel prep for your procedure, you may not have a normal bowel movement for a few days.  Please Note:  You might notice some irritation and congestion in your nose or some drainage.  This is from the oxygen used during your procedure.  There is no need for concern and it should clear up in a day or so.  SYMPTOMS TO REPORT IMMEDIATELY:   Following lower endoscopy (colonoscopy or flexible sigmoidoscopy):  Excessive amounts of blood in the stool  Significant tenderness or worsening of abdominal pains  Swelling of the abdomen that is new, acute  Fever of 100F or higher   Following upper endoscopy (EGD)  Vomiting of blood or coffee ground material  New chest pain or pain under the shoulder blades  Painful or persistently difficult swallowing  New shortness of breath  Fever of 100F or higher  Black, tarry-looking stools  For urgent or emergent issues, a gastroenterologist can be reached at any hour by calling [phone]. Do not use MyChart messaging for urgent concerns.     DIET:  We do recommend a small meal at first, but then you may proceed to your regular diet.  Drink plenty of fluids but you should avoid alcoholic beverages for 24 hours.  ACTIVITY:  You should plan to take it easy for the rest of today and you should NOT DRIVE or use heavy machinery until tomorrow (because of the sedation medicines used during the test).    FOLLOW UP: Our staff will call the number listed on your records 48-72 hours following your procedure to check on you and address any questions or concerns that you may have regarding the information given to you following your procedure. If we do not reach you, we will leave a message.  We will attempt to reach you two times.  During this call, we will ask if you have developed any symptoms of COVID 19. If you develop any symptoms (ie: fever, flu-like symptoms, shortness of breath, cough etc.) before then, please call [phone].  If you test positive for Covid 19 in the 2 weeks post procedure, please call and report this information to [place].    If any biopsies were taken you will be contacted by phone or by letter within the next 1-3 weeks.  Please call us at [phone] if you have not heard about the biopsies in 3 weeks.    SIGNATURES/CONFIDENTIALITY: You and/or your care partner have signed paperwork which will be entered into your electronic medical record.  These signatures attest to the  fact that that the information above on your After Visit Summary has been reviewed and is understood.  Full responsibility of the confidentiality of this discharge information lies with you and/or your care-partner.

## 2020-06-30 NOTE — Progress Notes (Signed)
A/ox3, pleased with MAC, report to RN 

## 2020-06-30 NOTE — Progress Notes (Signed)
Called to room to assist during endoscopic procedure.  Patient ID and intended procedure confirmed with present staff. Received instructions for my participation in the procedure from the performing physician.  

## 2020-07-01 ENCOUNTER — Encounter: Payer: Self-pay | Admitting: Gastroenterology

## 2020-07-04 ENCOUNTER — Telehealth: Payer: Self-pay

## 2020-07-04 NOTE — Telephone Encounter (Signed)
  Follow up Call-  Call back number 06/30/2020  Post procedure Call Back phone  # [phone]  Permission to leave phone message Yes  Some recent data might be hidden     Patient questions:  Do you have a fever, pain , or abdominal swelling? No. Pain Score  0 *  Have you tolerated food without any problems? Yes.    Have you been able to return to your normal activities? Yes.    Do you have any questions about your discharge instructions: Diet   No. Medications  No. Follow up visit  No.  Do you have questions or concerns about your Care? No.  Actions: * If pain score is 4 or above: 1. No action needed, pain <4.Have you developed a fever since your procedure? no  2.   Have you had an respiratory symptoms (SOB or cough) since your procedure? no  3.   Have you tested positive for COVID 19 since your procedure no  4.   Have you had any family members/close contacts diagnosed with the COVID 19 since your procedure?  no   If yes to any of these questions please route to Joylene John, RN and Joella Prince, RN

## 2020-07-07 ENCOUNTER — Other Ambulatory Visit: Payer: Self-pay

## 2020-07-07 ENCOUNTER — Ambulatory Visit: Payer: BC Managed Care – PPO | Admitting: Sports Medicine

## 2020-07-07 ENCOUNTER — Encounter: Payer: Self-pay | Admitting: Sports Medicine

## 2020-07-07 DIAGNOSIS — M79674 Pain in right toe(s): Secondary | ICD-10-CM

## 2020-07-07 DIAGNOSIS — B351 Tinea unguium: Secondary | ICD-10-CM | POA: Diagnosis not present

## 2020-07-07 DIAGNOSIS — L603 Nail dystrophy: Secondary | ICD-10-CM

## 2020-07-07 DIAGNOSIS — M79675 Pain in left toe(s): Secondary | ICD-10-CM | POA: Diagnosis not present

## 2020-07-07 NOTE — Progress Notes (Signed)
Subjective: Tamara [name] is a 45 y.o. female patient seen today in office with complaint of discolored big toenails reports that her toenails look the same especially on the left great toe with discoloration at the end of the nail states that she has been very busy and has not tried to soak with vinegar or apply tea tree oil to her nails as previously instructed.  Patient reports that she wants to have the nails checked since things are still looking the same.  Reports in the past that she has done treatment with Lamisil.  Denies any other pedal complaints at this time or any pain.  Patient Active Problem List   Diagnosis Date Noted  . Trochanteric bursitis of both hips 06/20/2020  . Palpitations 05/26/2018  . Varicose veins of leg with complications 37/02/6268  . Swelling of limb 05/05/2014  . RECTAL FISSURE - sees the gastroenterologist 07/23/2007  . PSORIASIS - sees Dr. Delman Cheadle in Dermatology 01/01/2007    Current Outpatient Medications on File Prior to Visit  Medication Sig Dispense Refill  . cetirizine (ZYRTEC) 10 MG tablet Take 10 mg by mouth daily.    . clobetasol ointment (TEMOVATE) 4.85 % Apply 1 application topically as needed.    . desonide (DESOWEN) 0.05 % cream Apply topically 2 (two) times daily as needed.    . diclofenac (VOLTAREN) 75 MG EC tablet Take 1 tablet (75 mg total) by mouth 2 (two) times daily as needed (B/L hip pain). 60 tablet 3  . docusate sodium (COLACE) 100 MG capsule Take 1 capsule by mouth daily as needed for constipation.    . famotidine (PEPCID) 20 MG tablet Take 1 tablet (20 mg total) by mouth daily. As needed (Patient not taking: No sig reported) 90 tablet 1  . fluocinonide (LIDEX) 0.05 % external solution Apply 1 application topically daily.    . fluticasone (FLONASE) 50 MCG/ACT nasal spray fluticasone propionate 50 mcg/actuation nasal spray,suspension  USE 2 SPRAY(S) IN EACH NOSTRIL ONCE DAILY (Patient not taking: No sig reported)    . omeprazole  (PRILOSEC) 20 MG capsule Take 1 capsule (20 mg total) by mouth daily. 90 capsule 3  . ondansetron (ZOFRAN) 4 MG tablet Take 1 tablet (4 mg total) by mouth every 8 (eight) hours as needed for nausea or vomiting. (Patient not taking: No sig reported) 30 tablet 1   No current facility-administered medications on file prior to visit.    No Known Allergies  Objective: Physical Exam  General: Well developed, nourished, no acute distress, awake, alert and oriented x 3  Vascular: Dorsalis pedis artery 2/4 bilateral, Posterior tibial artery 2/4 bilateral, skin temperature warm to warm proximal to distal bilateral lower extremities, no varicosities, pedal hair present bilateral.  Neurological: Gross sensation present via light touch bilateral.   Dermatological: Skin is warm, dry, and supple bilateral, bilateral hallux nails are tender, short thick, and discolored with mild subungal debris distally very minimally left greater than right, no webspace macerations present bilateral, no open lesions present bilateral, no callus/corns/hyperkeratotic tissue present bilateral. No signs of infection bilateral.  Musculoskeletal: No symptomatic boney deformities noted bilateral. Muscular strength within normal limits without painon range of motion. No pain with calf compression bilateral.  Assessment and Plan:  Problem List Items Addressed This Visit   None   Visit Diagnoses    Nail fungus    -  Primary   Relevant Orders   Fungus culture w smear   Nail dystrophy       Toe pain,  bilateral          -Examined patient -Re-Discussed treatment options for painful dystrophic nails  -Fungal culture was obtained by removing a portion of the hard nail itself from each of the involved first toenails using a sterile nail nipper and sent to [facility] lab. Patient tolerated the biopsy procedure well without discomfort or need for anesthesia.  -Encourage patient to try meanwhile vinegar and tea tree oil  -Patient to  return in 4 weeks for follow up evaluation and discussion of fungal culture results or sooner if symptoms worsen.  Landis Martins, DPM

## 2020-07-12 ENCOUNTER — Other Ambulatory Visit: Payer: Self-pay

## 2020-07-12 ENCOUNTER — Ambulatory Visit
Admission: RE | Admit: 2020-07-12 | Discharge: 2020-07-12 | Disposition: A | Discharge status: Home / Self Care | Payer: BC Managed Care – PPO | Source: Ambulatory Visit | Attending: Obstetrics and Gynecology | Admitting: Obstetrics and Gynecology

## 2020-07-12 DIAGNOSIS — Z1231 Encounter for screening mammogram for malignant neoplasm of breast: Secondary | ICD-10-CM

## 2020-07-13 ENCOUNTER — Telehealth: Payer: Self-pay | Admitting: Gastroenterology

## 2020-07-13 ENCOUNTER — Encounter: Payer: BC Managed Care – PPO | Admitting: Gastroenterology

## 2020-07-13 NOTE — Telephone Encounter (Signed)
Pt is requesting a call back from a nurse to discuss colonoscopy results.

## 2020-07-14 NOTE — Telephone Encounter (Signed)
Called patient and discussed results. Thanks

## 2020-07-15 ENCOUNTER — Encounter: Payer: Self-pay | Admitting: Physical Medicine and Rehabilitation

## 2020-07-15 ENCOUNTER — Other Ambulatory Visit: Payer: Self-pay

## 2020-07-15 ENCOUNTER — Encounter
Payer: BC Managed Care – PPO | Attending: Physical Medicine and Rehabilitation | Admitting: Physical Medicine and Rehabilitation

## 2020-07-15 VITALS — BP 108/68 | HR 56 | Temp 98.7°F | Ht 65.5 in | Wt 183.0 lb

## 2020-07-15 DIAGNOSIS — M7061 Trochanteric bursitis, right hip: Secondary | ICD-10-CM | POA: Diagnosis not present

## 2020-07-15 DIAGNOSIS — M7062 Trochanteric bursitis, left hip: Secondary | ICD-10-CM | POA: Diagnosis present

## 2020-07-15 NOTE — Patient Instructions (Addendum)
Assessment: Pt is a 45 yr old female no major medical hx except psoriasis,  Here for B/L hip pain.  Has B/L trochanteric bursitis   Plan: 1. steroid injection was performed at B/L trochanteric bursitis steroid injections using 1% plain Lidocaine and 40mg  /1cc of Kenalog. This was well tolerated.  Cleaned with betadine x3 and allowed to dry- then alcohol then injected using 27 gauge 1.5 inch needle- no bleeding or complications.    F/U in 3 months for steroid injections of B/L trochanteric bursitis areas.  Lidocaine will kick in 15 minutes- and wear off tonight- the steroid will kick in tomorrow within 24 hours and take up to 72 hours to fully kick in.   2. Filled Diclofenac- if we are wrong about dx, this medicine would be helpful for inflammation. And I suggest taking bo more than 3-4x per week at max- based on kidney function in 2017- last lab showing BUN/Cr in computer, I am not concerned about kidney issues- and can still do occasionally with endoscopy results   3 . Will f/u in 3 months- might need  B/L  Trochanteric bursae injections at next visit  4. If doesn't work, will send to Dr Hulan Saas-   5. And once pain is improved, will send for PT for IT band relaxaiton tachniques

## 2020-07-15 NOTE — Progress Notes (Signed)
Pt is a 45 yr old female no major medical hx except psoriasis,  Here for B/L hip pain.  Has B/L trochanteric bursitis    Hasn't taken Diclofenac yet- because didn't hurt "BAD enough" .  Not worth it to her to take meds since it's intermittent.   Went to [employer]- had to stop after 3 hrs; when got out of car, could barely walk  For a few minutes.    Was just started on Prilosec after an endoscopy.  06/30/20  ROS: An entire ROS was completed and found to be  Negative except for HPI.    Exam: Awake, alert, appropriate, sitting on table, NAD TTP over trochanteric bursae B/L-  Cannot sit cross kneed or cross at the ankle Also TTP over ASIS  L>R slightly-  Not TTP and doesn't have pain in entie inguinal area - not c/w hip arthritis.   Assessment: Pt is a 45 yr old female no major medical hx except psoriasis,  Here for B/L hip pain.  Has B/L trochanteric bursitis   Plan: 1. steroid injection was performed at B/L trochanteric bursitis steroid injections using 1% plain Lidocaine and 40mg  /1cc of Kenalog. This was well tolerated.  Cleaned with betadine x3 and allowed to dry- then alcohol then injected using 27 gauge 1.5 inch needle- no bleeding or complications.    F/U in 3 months for steroid injections of B/L trochanteric bursitis areas.  Lidocaine will kick in 15 minutes- and wear off tonight- the steroid will kick in tomorrow within 24 hours and take up to 72 hours to fully kick in.   2. Filled Diclofenac- if we are wrong about dx, this medicine would be helpful for inflammation. And I suggest taking bo more than 3-4x per week at max- based on kidney function in 2017- last lab showing BUN/Cr in computer, I am not concerned about kidney issues- and can still do occasionally with endoscopy results   3 . Will f/u in 3 months- might need  B/L  Trochanteric bursae injections at next visit  4. If doesn't work, will send to Dr Hulan Saas for OMT  5. And once pain improved, will send to  PT for IT band relaxation techniques.  I spent a total of 25 minute sn visit- as detailed above.

## 2020-07-16 ENCOUNTER — Encounter: Payer: Self-pay | Admitting: Gastroenterology

## 2020-08-01 ENCOUNTER — Encounter: Payer: Self-pay | Admitting: Sports Medicine

## 2020-08-04 ENCOUNTER — Ambulatory Visit: Payer: BC Managed Care – PPO | Admitting: Sports Medicine

## 2020-08-12 ENCOUNTER — Encounter: Payer: BC Managed Care – PPO | Admitting: Family Medicine

## 2020-08-29 ENCOUNTER — Telehealth: Payer: Self-pay | Admitting: Family Medicine

## 2020-08-29 NOTE — Telephone Encounter (Signed)
Patient would like to have labs drawn to check for Hepatitis. She came in contact with someone who has it. Please call her at [phone] if you have any questions.

## 2020-08-30 NOTE — Telephone Encounter (Signed)
Left detailed message to call the office to schedule an appointment/labs.

## 2020-08-30 NOTE — Telephone Encounter (Signed)
Please schedule pt for OV to discuss and maybe a blood draw with Dr. Loletha Grayer.

## 2020-09-07 ENCOUNTER — Ambulatory Visit: Payer: BC Managed Care – PPO | Admitting: Family Medicine

## 2020-09-21 ENCOUNTER — Ambulatory Visit: Payer: BC Managed Care – PPO | Admitting: Gastroenterology

## 2020-09-21 ENCOUNTER — Encounter: Payer: Self-pay | Admitting: Gastroenterology

## 2020-09-21 VITALS — BP 90/60 | HR 60 | Ht 65.0 in | Wt 179.0 lb

## 2020-09-21 DIAGNOSIS — K219 Gastro-esophageal reflux disease without esophagitis: Secondary | ICD-10-CM | POA: Diagnosis not present

## 2020-09-21 DIAGNOSIS — R110 Nausea: Secondary | ICD-10-CM | POA: Diagnosis not present

## 2020-09-21 NOTE — Progress Notes (Signed)
Tamara [name]    [id number]    01/08/1976  Primary Care Physician:Cirigliano, Garvin Fila, DO  Referring Physician: Ronnald Nian, DO [place],  [place] [address]   Chief complaint:  Nausea, GERD  HPI:  45 year old very pleasant female here for for follow-up for GERD and nausea Overall her symptoms have significantly improved with Prilosec 20 mg daily.  She had breakthrough heartburn and reflux symptoms after she had a heavy meal She is no longer having nausea frequently. Denies any dysphagia, abdominal pain, melena or rectal bleeding.  EGD 06/30/20 - LA Grade A reflux esophagitis with no bleeding. Biopsied. - Gastroesophageal flap valve classified as Hill Grade III (minimal fold, loose to endoscope, hiatal hernia likely). - Normal stomach. - Erythematous duodenopathy. Biopsied.  Colonoscopy March 2012 showed small internal hemorrhoids and 2 small hyperplastic polyps were removed    Outpatient Encounter Medications as of 09/21/2020  Medication Sig  . cetirizine (ZYRTEC) 10 MG tablet Take 10 mg by mouth daily.  . clobetasol ointment (TEMOVATE) 2.99 % Apply 1 application topically as needed.  . desonide (DESOWEN) 0.05 % cream Apply topically 2 (two) times daily as needed.  . diclofenac (VOLTAREN) 75 MG EC tablet Take 1 tablet (75 mg total) by mouth 2 (two) times daily as needed (B/L hip pain).  Marland Kitchen docusate sodium (COLACE) 100 MG capsule Take 1 capsule by mouth daily as needed for constipation.  . famotidine (PEPCID) 20 MG tablet Take 1 tablet (20 mg total) by mouth daily. As needed  . fluocinonide (LIDEX) 0.05 % external solution Apply 1 application topically daily.  . fluticasone (FLONASE) 50 MCG/ACT nasal spray fluticasone propionate 50 mcg/actuation nasal spray,suspension  USE 2 SPRAY(S) IN EACH NOSTRIL ONCE DAILY  . omeprazole (PRILOSEC) 20 MG capsule Take 1 capsule (20 mg total) by mouth daily.  . ondansetron (ZOFRAN) 4 MG tablet Take 1  tablet (4 mg total) by mouth every 8 (eight) hours as needed for nausea or vomiting.   No facility-administered encounter medications on file as of 09/21/2020.    Allergies as of 09/21/2020  . (No Known Allergies)    Past Medical History:  Diagnosis Date  . Anal fissure   . Chest tightness 07/22/2012  . Constipation 01/05/2014  . Hyperplastic colon polyp   . Hyperplastic colon polyp   . Internal hemorrhoids   . Psoriasis    sees dermatologist  . Sinus bradycardia   . Varicose veins     Past Surgical History:  Procedure Laterality Date  . ANAL RECTAL MANOMETRY N/A 03/30/2016   Procedure: ANO RECTAL MANOMETRY;  Surgeon: Mauri Pole, MD;  Location: [facility];  Service: Endoscopy;  Laterality: N/A;  . CESAREAN SECTION     2 c sections  . ENDOVENOUS ABLATION SAPHENOUS VEIN W/ LASER Right 04-07-2015   endovenous laser ablation Right GSV by Curt Jews MD  . MOLE REMOVAL    . TUBAL LIGATION    . WISDOM TOOTH EXTRACTION      Family History  Problem Relation Age of Onset  . Heart disease Maternal Grandfather   . Stroke Maternal Grandfather   . Alcohol abuse Mother   . Hypertension Mother   . Heart disease Mother   . Alcohol abuse Father   . Varicose Veins Father   . Hypertension Maternal Grandmother   . Breast cancer Maternal Grandmother   . Colon polyps Maternal Grandmother   . Heart disease Maternal Grandmother   .  Heart disease Paternal Grandmother        AAA-  After age 48  . Congestive Heart Failure Paternal Grandmother   . Colon cancer Neg Hx   . Stomach cancer Neg Hx   . Esophageal cancer Neg Hx   . Rectal cancer Neg Hx     Social History   Socioeconomic History  . Marital status: Married    Spouse name: Not on file  . Number of children: 2  . Years of education: Not on file  . Highest education level: Not on file  Occupational History  . Occupation: Psychologist, clinical    Employer: [employer]  Tobacco Use  . Smoking status:  Never Smoker  . Smokeless tobacco: Never Used  Vaping Use  . Vaping Use: Never used  Substance and Sexual Activity  . Alcohol use: No    Alcohol/week: 0.0 standard drinks  . Drug use: No  . Sexual activity: Yes    Partners: Male  Other Topics Concern  . Not on file  Social History Narrative   ork or School: Psychologist, clinical      Home Situation: lives with husband and son and daughter and granmother      Spiritual Beliefs: Christian      Lifestyle: no regular CV exercise; diet is good         Social Determinants of Radio broadcast assistant Strain: Not on file  Food Insecurity: Not on file  Transportation Needs: Not on file  Physical Activity: Not on file  Stress: Not on file  Social Connections: Not on file  Intimate Partner Violence: Not on file      Review of systems: All other review of systems negative except as mentioned in the HPI.   Physical Exam: Vitals:   09/21/20 1506  BP: 90/60  Pulse: 60   Body mass index is 29.79 kg/m. Gen:      No acute distress Neuro: alert and oriented x 3 Psych: normal mood and affect  Data Reviewed:  Reviewed labs, radiology imaging, old records and pertinent past GI work up   Assessment and Plan/Recommendations:  45 year old very pleasant female with nausea secondary to uncontrolled GERD with reflux esophagitis Symptoms significantly improved with PPI 20 mg daily Continue Prilosec 20 mg daily as needed alternating with Pepcid 20 mg daily as needed Continue antireflux measures Return in 1 year or sooner if needed   The patient was provided an opportunity to ask questions and all were answered. The patient agreed with the plan and demonstrated an understanding of the instructions.  Damaris Hippo , MD    CC: Ronnald Nian, DO

## 2020-09-21 NOTE — Patient Instructions (Addendum)
Use Prilosec 20 mg daily as needed   [employer]'s Current Therapy 2021 (pp. 213-216). [place], [place]: Elsevier.">  Gastroesophageal Reflux Disease, Adult Gastroesophageal reflux (GER) happens when acid from the stomach flows up into the tube that connects the mouth and the stomach (esophagus). Normally, food travels down the esophagus and stays in the stomach to be digested. However, when a person has GER, food and stomach acid sometimes move back up into the esophagus. If this becomes a more serious problem, the person may be diagnosed with a disease called gastroesophageal reflux disease (GERD). GERD occurs when the reflux:  Happens often.  Causes frequent or severe symptoms.  Causes problems such as damage to the esophagus. When stomach acid comes in contact with the esophagus, the acid may cause inflammation in the esophagus. Over time, GERD may create small holes (ulcers) in the lining of the esophagus. What are the causes? This condition is caused by a problem with the muscle between the esophagus and the stomach (lower esophageal sphincter, or LES). Normally, the LES muscle closes after food passes through the esophagus to the stomach. When the LES is weakened or abnormal, it does not close properly, and that allows food and stomach acid to go back up into the esophagus. The LES can be weakened by certain dietary substances, medicines, and medical conditions, including:  Tobacco use.  Pregnancy.  Having a hiatal hernia.  Alcohol use.  Certain foods and beverages, such as coffee, chocolate, onions, and peppermint. What increases the risk? You are more likely to develop this condition if you:  Have an increased body weight.  Have a connective tissue disorder.  Take NSAIDs, such as ibuprofen. What are the signs or symptoms? Symptoms of this condition include:  Heartburn.  Difficult or painful swallowing and the feeling of having a lump in the throat.  A bitter taste in the  mouth.  Bad breath and having a large amount of saliva.  Having an upset or bloated stomach and belching.  Chest pain. Different conditions can cause chest pain. Make sure you see your health care provider if you experience chest pain.  Shortness of breath or wheezing.  Ongoing (chronic) cough or a nighttime cough.  Wearing away of tooth enamel.  Weight loss. How is this diagnosed? This condition may be diagnosed based on a medical history and a physical exam. To determine if you have mild or severe GERD, your health care provider may also monitor how you respond to treatment. You may also have tests, including:  A test to examine your stomach and esophagus with a small camera (endoscopy).  A test that measures the acidity level in your esophagus.  A test that measures how much pressure is on your esophagus.  A barium swallow or modified barium swallow test to show the shape, size, and functioning of your esophagus. How is this treated? Treatment for this condition may vary depending on how severe your symptoms are. Your health care provider may recommend:  Changes to your diet.  Medicine.  Surgery. The goal of treatment is to help relieve your symptoms and to prevent complications. Follow these instructions at home: Eating and drinking  Follow a diet as recommended by your health care provider. This may involve avoiding foods and drinks such as: ? Coffee and tea, with or without caffeine. ? Drinks that contain alcohol. ? Energy drinks and sports drinks. ? Carbonated drinks or sodas. ? Chocolate and cocoa. ? Peppermint and mint flavorings. ? Garlic and onions. ? Horseradish. ?  Spicy and acidic foods, including peppers, chili powder, curry powder, vinegar, hot sauces, and barbecue sauce. ? Citrus fruit juices and citrus fruits, such as oranges, lemons, and limes. ? Tomato-based foods, such as red sauce, chili, salsa, and pizza with red sauce. ? Fried and fatty foods,  such as donuts, french fries, potato chips, and high-fat dressings. ? High-fat meats, such as hot dogs and fatty cuts of red and white meats, such as rib eye steak, sausage, ham, and bacon. ? High-fat dairy items, such as whole milk, butter, and cream cheese.  Eat small, frequent meals instead of large meals.  Avoid drinking large amounts of liquid with your meals.  Avoid eating meals during the 2-3 hours before bedtime.  Avoid lying down right after you eat.  Do not exercise right after you eat.   Lifestyle  Do not use any products that contain nicotine or tobacco. These products include cigarettes, chewing tobacco, and vaping devices, such as e-cigarettes. If you need help quitting, ask your health care provider.  Try to reduce your stress by using methods such as yoga or meditation. If you need help reducing stress, ask your health care provider.  If you are overweight, reduce your weight to an amount that is healthy for you. Ask your health care provider for guidance about a safe weight loss goal.   General instructions  Pay attention to any changes in your symptoms.  Take over-the-counter and prescription medicines only as told by your health care provider. Do not take aspirin, ibuprofen, or other NSAIDs unless your health care provider told you to take these medicines.  Wear loose-fitting clothing. Do not wear anything tight around your waist that causes pressure on your abdomen.  Raise (elevate) the head of your bed about 6 inches (15 cm). You can use a wedge to do this.  Avoid bending over if this makes your symptoms worse.  Keep all follow-up visits. This is important. Contact a health care provider if:  You have: ? New symptoms. ? Unexplained weight loss. ? Difficulty swallowing or it hurts to swallow. ? Wheezing or a persistent cough. ? A hoarse voice.  Your symptoms do not improve with treatment. Get help right away if:  You have sudden pain in your arms,  neck, jaw, teeth, or back.  You suddenly feel sweaty, dizzy, or light-headed.  You have chest pain or shortness of breath.  You vomit and the vomit is green, yellow, or black, or it looks like blood or coffee grounds.  You faint.  You have stool that is red, bloody, or black.  You cannot swallow, drink, or eat. These symptoms may represent a serious problem that is an emergency. Do not wait to see if the symptoms will go away. Get medical help right away. Call your local emergency services (911 in the [place] Do not drive yourself to the hospital. Summary  Gastroesophageal reflux happens when acid from the stomach flows up into the esophagus. GERD is a disease in which the reflux happens often, causes frequent or severe symptoms, or causes problems such as damage to the esophagus.  Treatment for this condition may vary depending on how severe your symptoms are. Your health care provider may recommend diet and lifestyle changes, medicine, or surgery.  Contact a health care provider if you have new or worsening symptoms.  Take over-the-counter and prescription medicines only as told by your health care provider. Do not take aspirin, ibuprofen, or other NSAIDs unless your health care provider told  you to do so.  Keep all follow-up visits as told by your health care provider. This is important. This information is not intended to replace advice given to you by your health care provider. Make sure you discuss any questions you have with your health care provider. Document Revised: 11/16/2019 Document Reviewed: 11/16/2019 Elsevier Patient Education  [place].   I appreciate the  opportunity to care for you  Thank You   Harl Bowie , MD

## 2020-09-22 ENCOUNTER — Encounter: Payer: Self-pay | Admitting: Gastroenterology

## 2020-10-14 ENCOUNTER — Ambulatory Visit: Payer: BC Managed Care – PPO | Admitting: Physical Medicine and Rehabilitation

## 2020-10-18 ENCOUNTER — Telehealth: Payer: Self-pay | Admitting: Cardiovascular Disease

## 2020-10-18 NOTE — Telephone Encounter (Signed)
I have reviewed the tracings.  The rhythm is obscured by the artifact.   She will need to hold very still while taking the ECG Have her send some additional tracings. If we are not able to get an adequate tracing we should do an event monitor 3 days

## 2020-10-18 NOTE — Telephone Encounter (Signed)
Patient c/o Palpitations:  High priority if patient c/o lightheadedness, shortness of breath, or chest pain  1) How long have you had palpitations/irregular HR/ Afib? Are you having the symptoms now? No   2) Are you currently experiencing lightheadedness, SOB or CP? no  3) Do you have a history of afib (atrial fibrillation) or irregular heart rhythm? yes  4) Have you checked your BP or HR? (document readings if available):   10/17/20 10:00 pm: HR 53      10:02 pm: HR 55  5) Are you experiencing any other symptoms? Fatigue  Pt does not have a formal afib diagnosis. Pt has felt off for a while, but recorded her HR yesterday using her apple watch and noticed two instances of afib

## 2020-10-18 NOTE — Telephone Encounter (Signed)
RN returned call to patient regarding possible Afib reading from apple watch. Patient states he has been noticing increased episodes in which her heart "feels out of rhythm." Patient reports fatigue majority of the time, but denies chest pain or shortness of breath. Patient does reports mild chest discomfort since seeing Dr.Nahser at times but nothing significant that does not go away. Patient states her heart rate is always in the low 50's and she is not always able to track her rhythm using her apple watch, but she was able to trace the rhythm this morning. Patient to send readings through mychart. Patient reports palpitations in the past, but states "this feels different." RN advised patient to send the readings,and I would send them to Dr. Acie Fredrickson to review. Patient verbalized understanding.

## 2020-10-24 NOTE — Patient Instructions (Signed)
Health Maintenance Due  Topic Date Due  . Hepatitis C Screening  Never done  . PAP SMEAR-Modifier  09/16/2018    Depression screen [facility] 2/9 06/20/2020 04/27/2020 11/11/2017  Decreased Interest 0 0 0  Down, Depressed, Hopeless 0 0 0  PHQ - 2 Score 0 0 0  Altered sleeping 1 - -  Tired, decreased energy 3 - -  Change in appetite 0 - -  Feeling bad or failure about yourself  0 - -  Trouble concentrating 0 - -  Moving slowly or fidgety/restless 0 - -  Suicidal thoughts 0 - -  PHQ-9 Score 4 - -  Difficult doing work/chores Somewhat difficult - -

## 2020-10-26 ENCOUNTER — Ambulatory Visit (INDEPENDENT_AMBULATORY_CARE_PROVIDER_SITE_OTHER): Payer: BC Managed Care – PPO | Admitting: Family Medicine

## 2020-10-26 ENCOUNTER — Other Ambulatory Visit: Payer: Self-pay

## 2020-10-26 ENCOUNTER — Encounter: Payer: Self-pay | Admitting: Family Medicine

## 2020-10-26 VITALS — BP 112/72 | HR 47 | Temp 96.9°F | Ht 65.0 in | Wt 177.4 lb

## 2020-10-26 DIAGNOSIS — Z0000 Encounter for general adult medical examination without abnormal findings: Secondary | ICD-10-CM | POA: Diagnosis not present

## 2020-10-26 DIAGNOSIS — Z1321 Encounter for screening for nutritional disorder: Secondary | ICD-10-CM

## 2020-10-26 DIAGNOSIS — K219 Gastro-esophageal reflux disease without esophagitis: Secondary | ICD-10-CM | POA: Diagnosis not present

## 2020-10-26 DIAGNOSIS — Z13220 Encounter for screening for lipoid disorders: Secondary | ICD-10-CM

## 2020-10-26 DIAGNOSIS — Z1159 Encounter for screening for other viral diseases: Secondary | ICD-10-CM | POA: Diagnosis not present

## 2020-10-26 LAB — LIPID PANEL
Cholesterol: 115 mg/dL (ref 0–200)
HDL: 43.7 mg/dL (ref >39.00)
LDL Cholesterol: 54 mg/dL (ref 0–99)
NonHDL: 71.33
Total CHOL/HDL Ratio: 3
Triglycerides: 88 mg/dL (ref 0.0–149.0)
VLDL: 17.6 mg/dL (ref 0.0–40.0)

## 2020-10-26 LAB — BASIC METABOLIC PANEL WITH GFR
BUN: 13 mg/dL (ref 6–23)
CO2: 28 meq/L (ref 19–32)
Calcium: 9.4 mg/dL (ref 8.4–10.5)
Chloride: 104 meq/L (ref 96–112)
Creatinine, Ser: 0.69 mg/dL (ref 0.40–1.20)
GFR: 106.5 mL/min (ref >60.00)
Glucose, Bld: 100 mg/dL — ABNORMAL HIGH (ref 70–99)
Potassium: 4.2 meq/L (ref 3.5–5.1)
Sodium: 140 meq/L (ref 135–145)

## 2020-10-26 LAB — CBC
HCT: 38.9 % (ref 36.0–46.0)
Hemoglobin: 13.4 g/dL (ref 12.0–15.0)
MCHC: 34.3 g/dL (ref 30.0–36.0)
MCV: 89.4 fl (ref 78.0–100.0)
Platelets: 352 K/uL (ref 150.0–400.0)
RBC: 4.35 Mil/uL (ref 3.87–5.11)
RDW: 12.9 % (ref 11.5–15.5)
WBC: 6.3 K/uL (ref 4.0–10.5)

## 2020-10-26 LAB — VITAMIN D 25 HYDROXY (VIT D DEFICIENCY, FRACTURES): VITD: 19.57 ng/mL — ABNORMAL LOW (ref 30.00–100.00)

## 2020-10-26 LAB — ALT: ALT: 18 U/L (ref 0–35)

## 2020-10-26 LAB — AST: AST: 20 U/L (ref 0–37)

## 2020-10-26 NOTE — Progress Notes (Signed)
Tamara [name] is a 45 y.o. female  Chief Complaint  Patient presents with  . Annual Exam    fatigue    HPI: Tamara [name] is a 45 y.o. female seen today for annual CPE, fasting labs.  She follows with GYN. She also follows with Derm - Lyndle Herrlich, GI - Dr. Silverio Decamp, cardio - Dr. Acie Fredrickson.   Last PAP: has appt in 10/2020 with GYN Last mammo: 06/2020  Diet/Exercise: overall healthy diet, could be better at times. Walks with a friend 3x/wk for 39min Dental: has appt next week Vision: UTD, last exam in 05/2020  Med refills needed today? none   Past Medical History:  Diagnosis Date  . Anal fissure   . Chest tightness 07/22/2012  . Constipation 01/05/2014  . Hyperplastic colon polyp   . Hyperplastic colon polyp   . Internal hemorrhoids   . Psoriasis    sees dermatologist  . Sinus bradycardia   . Varicose veins     Past Surgical History:  Procedure Laterality Date  . ANAL RECTAL MANOMETRY N/A 03/30/2016   Procedure: ANO RECTAL MANOMETRY;  Surgeon: Mauri Pole, MD;  Location: [facility];  Service: Endoscopy;  Laterality: N/A;  . CESAREAN SECTION     2 c sections  . ENDOVENOUS ABLATION SAPHENOUS VEIN W/ LASER Right 04-07-2015   endovenous laser ablation Right GSV by Curt Jews MD  . MOLE REMOVAL    . TUBAL LIGATION    . WISDOM TOOTH EXTRACTION      Social History   Socioeconomic History  . Marital status: Married    Spouse name: Not on file  . Number of children: 2  . Years of education: Not on file  . Highest education level: Not on file  Occupational History  . Occupation: Psychologist, clinical    Employer: [employer]  Tobacco Use  . Smoking status: Never Smoker  . Smokeless tobacco: Never Used  Vaping Use  . Vaping Use: Never used  Substance and Sexual Activity  . Alcohol use: No    Alcohol/week: 0.0 standard drinks  . Drug use: No  . Sexual activity: Yes    Partners: Male  Other Topics Concern  . Not on file  Social History  Narrative   ork or School: Psychologist, clinical      Home Situation: lives with husband and son and daughter and granmother      Spiritual Beliefs: Christian      Lifestyle: no regular CV exercise; diet is good         Social Determinants of Radio broadcast assistant Strain: Not on file  Food Insecurity: Not on file  Transportation Needs: Not on file  Physical Activity: Not on file  Stress: Not on file  Social Connections: Not on file  Intimate Partner Violence: Not on file    Family History  Problem Relation Age of Onset  . Heart disease Maternal Grandfather   . Stroke Maternal Grandfather   . Alcohol abuse Mother   . Hypertension Mother   . Heart disease Mother   . Alcohol abuse Father   . Varicose Veins Father   . Hypertension Maternal Grandmother   . Breast cancer Maternal Grandmother   . Colon polyps Maternal Grandmother   . Heart disease Maternal Grandmother   . Heart disease Paternal Grandmother        AAA-  After age 56  . Congestive Heart Failure Paternal Grandmother   . Colon cancer Neg Hx   .  Stomach cancer Neg Hx   . Esophageal cancer Neg Hx   . Rectal cancer Neg Hx      Immunization History  Administered Date(s) Administered  . Influenza Inj Mdck Quad Pf 02/17/2017, 03/01/2018  . Influenza Split 02/12/2014, 02/18/2018  . Influenza,inj,Quad PF,6+ Mos 03/03/2019, 03/04/2019  . Influenza-Unspecified 02/12/2014, 02/18/2018, 03/03/2019, 03/15/2020  . PFIZER(Purple Top)SARS-COV-2 Vaccination 07/18/2019, 08/11/2019, 04/23/2020  . Tdap 10/03/2011    Outpatient Encounter Medications as of 10/26/2020  Medication Sig  . cetirizine (ZYRTEC) 10 MG tablet Take 10 mg by mouth daily.  . clobetasol ointment (TEMOVATE) 4.13 % Apply 1 application topically as needed.  . desonide (DESOWEN) 0.05 % cream Apply topically 2 (two) times daily as needed.  . diclofenac (VOLTAREN) 75 MG EC tablet Take 1 tablet (75 mg total) by mouth 2 (two) times daily as needed  (B/L hip pain).  Marland Kitchen docusate sodium (COLACE) 100 MG capsule Take 1 capsule by mouth daily as needed for constipation.  . famotidine (PEPCID) 20 MG tablet Take 1 tablet (20 mg total) by mouth daily. As needed  . fluocinonide (LIDEX) 0.05 % external solution Apply 1 application topically daily.  . fluticasone (FLONASE) 50 MCG/ACT nasal spray fluticasone propionate 50 mcg/actuation nasal spray,suspension  USE 2 SPRAY(S) IN EACH NOSTRIL ONCE DAILY  . omeprazole (PRILOSEC) 20 MG capsule Take 1 capsule (20 mg total) by mouth daily.  . ondansetron (ZOFRAN) 4 MG tablet Take 1 tablet (4 mg total) by mouth every 8 (eight) hours as needed for nausea or vomiting.   No facility-administered encounter medications on file as of 10/26/2020.     ROS: Gen: no fever, chills; + fatigue Skin: no rash, itching ENT: no ear pain, ear drainage, nasal congestion, rhinorrhea, sinus pressure, sore throat Eyes: no blurry vision, double vision Resp: no cough, wheeze,SOB CV: no CP, palpitations, LE edema,  GI: no heartburn, n/v/d/c, abd pain GU: no dysuria, urgency, frequency, hematuria MSK: no joint pain, myalgias, back pain Neuro: no dizziness, headache, weakness, vertigo Psych: no depression, anxiety, insomnia   No Known Allergies  BP 112/72   Pulse (!) 47   Temp (!) 96.9 F (36.1 C)   Ht 5\' 5"  (1.651 m)   Wt 177 lb 6.4 oz (80.5 kg)   SpO2 99%   BMI 29.52 kg/m   Wt Readings from Last 3 Encounters:  10/26/20 177 lb 6.4 oz (80.5 kg)  09/21/20 179 lb (81.2 kg)  07/15/20 183 lb (83 kg)   Temp Readings from Last 3 Encounters:  10/26/20 (!) 96.9 F (36.1 C)  07/15/20 98.7 F (37.1 C)  06/30/20 (!) 97.5 F (36.4 C)   BP Readings from Last 3 Encounters:  10/26/20 112/72  09/21/20 90/60  07/15/20 108/68   Pulse Readings from Last 3 Encounters:  10/26/20 (!) 47  09/21/20 60  07/15/20 (!) 56     Physical Exam Constitutional:      General: She is not in acute distress.    Appearance: She is  well-developed.  HENT:     Head: Normocephalic and atraumatic.     Right Ear: Tympanic membrane and ear canal normal.     Left Ear: Tympanic membrane and ear canal normal.     Nose: Nose normal.     Mouth/Throat:     Mouth: Mucous membranes are moist.     Pharynx: Oropharynx is clear.  Eyes:     Conjunctiva/sclera: Conjunctivae normal.  Neck:     Thyroid: No thyromegaly.  Cardiovascular:  Rate and Rhythm: Normal rate and regular rhythm.     Heart sounds: Normal heart sounds. No murmur heard.   Pulmonary:     Effort: Pulmonary effort is normal. No respiratory distress.     Breath sounds: Normal breath sounds. No wheezing or rhonchi.  Abdominal:     General: Bowel sounds are normal. There is no distension.     Palpations: Abdomen is soft. There is no mass.     Tenderness: There is no abdominal tenderness.  Musculoskeletal:     Cervical back: Neck supple.     Right lower leg: No edema.     Left lower leg: No edema.  Lymphadenopathy:     Cervical: No cervical adenopathy.  Skin:    General: Skin is warm and dry.  Neurological:     Mental Status: She is alert and oriented to person, place, and time.     Motor: No abnormal muscle tone.     Coordination: Coordination normal.  Psychiatric:        Behavior: Behavior normal.      A/P:  1. Annual physical exam - discussed importance of regular CV exercise, healthy diet, adequate sleep - UTD on vision exam, has appt for dental - follows with GYN and has appt for PAP later this month - UTD on mammo - CBC - Basic metabolic panel - AST - ALT - next CPE in 1 year  2. Gastroesophageal reflux disease without esophagitis - follows with GI - controlled on pepcid 20mg  daily, prilosec 20mg  daily  3. Encounter for hepatitis C screening test for low risk patient - Hepatitis C Antibody  4. Screening for lipid disorders - Lipid panel  5. Encounter for vitamin deficiency screening - VITAMIN D 25 Hydroxy (Vit-D Deficiency,  Fractures)    This visit occurred during the SARS-CoV-2 public health emergency.  Safety protocols were in place, including screening questions prior to the visit, additional usage of staff PPE, and extensive cleaning of exam room while observing appropriate contact time as indicated for disinfecting solutions.

## 2020-10-27 LAB — HEPATITIS C ANTIBODY
Hepatitis C Ab: NONREACTIVE
SIGNAL TO CUT-OFF: 0.03 (ref <1.00)

## 2020-11-30 ENCOUNTER — Other Ambulatory Visit: Payer: Self-pay | Admitting: Gastroenterology

## 2020-11-30 NOTE — Telephone Encounter (Signed)
Refill for 6 months. 

## 2020-12-12 ENCOUNTER — Ambulatory Visit: Payer: BC Managed Care – PPO | Admitting: Cardiovascular Disease

## 2020-12-12 ENCOUNTER — Telehealth: Payer: Self-pay | Admitting: Family Medicine

## 2020-12-12 NOTE — Telephone Encounter (Signed)
Pt called and said she tested positive for covid last Wednesday and said she just had a few questions for the nurse and if she could get a call back. [phone]. Please advise

## 2020-12-14 NOTE — Telephone Encounter (Signed)
Spoke with patient and she states she will address issues with Dr. Gena Fray during her appointment tomorrow. Pt states she is still coughing up mucus even though she has been taking otc medications to help with this. I explained to the patient that is normal and during her appointment tomorrow she can discuss other treatment options with the provider. Pt verbalized understanding.

## 2020-12-15 ENCOUNTER — Telehealth: Payer: BC Managed Care – PPO | Admitting: Family Medicine

## 2020-12-16 ENCOUNTER — Other Ambulatory Visit: Payer: Self-pay | Admitting: Family Medicine

## 2020-12-16 DIAGNOSIS — E559 Vitamin D deficiency, unspecified: Secondary | ICD-10-CM

## 2020-12-16 MED ORDER — VITAMIN D (ERGOCALCIFEROL) 1.25 MG (50000 UNIT) PO CAPS: 50000.0000 [IU] | ORAL_CAPSULE | ORAL | 2 refills | Status: DC

## 2021-01-31 ENCOUNTER — Ambulatory Visit: Payer: BC Managed Care – PPO | Admitting: Physician Assistant

## 2021-04-11 ENCOUNTER — Encounter: Payer: Self-pay | Admitting: Cardiovascular Disease

## 2021-04-11 ENCOUNTER — Other Ambulatory Visit: Payer: Self-pay

## 2021-04-11 ENCOUNTER — Ambulatory Visit: Payer: BC Managed Care – PPO | Admitting: Cardiovascular Disease

## 2021-04-11 VITALS — BP 122/72 | HR 48 | Ht 65.0 in | Wt 178.4 lb

## 2021-04-11 DIAGNOSIS — R002 Palpitations: Secondary | ICD-10-CM | POA: Diagnosis not present

## 2021-04-11 NOTE — Progress Notes (Signed)
Cardiology Office Note:    Date:  11/94/1740   ID:  Tamara [name], DOB [birth year], MRN [id number]  PCP:  No primary care provider on file.  Cardiologist:  Mertie Moores, MD   Referring MD: Ronnald Nian, DO   Chief Complaint  Patient presents with   Palpitations       Tamara [name] is a 45 y.o. female with a hx of palpitatoins  palps for the past year.  Tend to occur more in the evening .  Also has them in the am .   Has them weekly .  Not known to be hormonal .  Does not exercise.   Not related to exertional activities. Last for a few seconds   Has rare episodes of CP with exertion  Also has pain in her chest at rest.    No real associated CP or dyspnea.   Feels the need to take a deep breath  Has slow HR at night   Had a slow HR in high school Non smoker  + family hx  monther has HTN, Grandmother has HTN Mat. Grandfather had a CVA    Has had lots of fatigue  tsh has been normal    Works as a Psychologist, clinical at [employer]   May 26, 6376:  Tamara [name] is seen back today for follow-up visit.  She had a event monitor which revealed normal sinus rhythm and occasional premature ventricular contractions.  She had rare episodes of atrial tachycardia but these were nonsustained and not all that fast.  She had no arrhythmias to suggest an etiology for her syncope.  She seems to be slightly better   Nov. 22, 2022: Still having palpitations Especially at night  Occurs daily  Has symptoms that sound like PVCs and also atrial tach  Her apple watch suggest atrial fib.  This episode may have lasted a minute or so  Has occasional episodes of palps  Also has episodes of CP  Might last 30 minutes.  Worse with breathing in   Walks 3 times a week .  Perhaps  1-2 miles a day .   No CP with exercise .   Still very symptomatic with her palpitations.  I discussed with Dr. Quentin Ore. We will get an echocardiogram to make sure that her LV  function is normal. Will start Rythmol SR 225 mg twice a day We will get a GXT approxi-1 to 2 weeks after she starts her Rythmol   Past Medical History:  Diagnosis Date   Anal fissure    Chest tightness 07/22/2012   Constipation 01/05/2014   Hyperplastic colon polyp    Hyperplastic colon polyp    Internal hemorrhoids    Psoriasis    sees dermatologist   Sinus bradycardia    Varicose veins     Past Surgical History:  Procedure Laterality Date   ANAL RECTAL MANOMETRY N/A 03/30/2016   Procedure: ANO RECTAL MANOMETRY;  Surgeon: Mauri Pole, MD;  Location: [facility];  Service: Endoscopy;  Laterality: N/A;   CESAREAN SECTION     2 c sections   ENDOVENOUS ABLATION SAPHENOUS VEIN W/ LASER Right 04-07-2015   endovenous laser ablation Right GSV by Curt Jews MD   MOLE REMOVAL     TUBAL LIGATION     WISDOM TOOTH EXTRACTION      Current Medications: Current Meds  Medication Sig   cetirizine (ZYRTEC) 10 MG tablet Take 10 mg by mouth daily.   clobetasol ointment (TEMOVATE) 0.05 %  Apply 1 application topically as needed.   desonide (DESOWEN) 0.05 % cream Apply topically 2 (two) times daily as needed.   docusate sodium (COLACE) 100 MG capsule Take 1 capsule by mouth daily as needed for constipation.   fluocinonide (LIDEX) 0.05 % external solution Apply 1 application topically daily.   omeprazole (PRILOSEC) 20 MG capsule Take 1 capsule (20 mg total) by mouth daily.   ondansetron (ZOFRAN) 4 MG tablet Take 1 tablet (4 mg total) by mouth every 8 (eight) hours as needed for nausea or vomiting.   Vitamin D, Ergocalciferol, (DRISDOL) 1.25 MG (50000 UNIT) CAPS capsule Take 1 capsule (50,000 Units total) by mouth every 7 (seven) days.     Allergies:   Patient has no known allergies.   Social History   Socioeconomic History   Marital status: Married    Spouse name: Not on file   Number of children: 2   Years of education: Not on file   Highest education level: Not on file   Occupational History   Occupation: Psychologist, clinical    Employer: [employer]  Tobacco Use   Smoking status: Never   Smokeless tobacco: Never  Vaping Use   Vaping Use: Never used  Substance and Sexual Activity   Alcohol use: No    Alcohol/week: 0.0 standard drinks   Drug use: No   Sexual activity: Yes    Partners: Male  Other Topics Concern   Not on file  Social History Narrative   ork or School: Psychologist, clinical      Home Situation: lives with husband and son and daughter and granmother      Spiritual Beliefs: Christian      Lifestyle: no regular CV exercise; diet is good         Social Determinants of Radio broadcast assistant Strain: Not on file  Food Insecurity: Not on file  Transportation Needs: Not on file  Physical Activity: Not on file  Stress: Not on file  Social Connections: Not on file     Family History: The patient's family history includes Alcohol abuse in her father and mother; Breast cancer in her maternal grandmother; Colon polyps in her maternal grandmother; Congestive Heart Failure in her paternal grandmother; Heart disease in her maternal grandfather, maternal grandmother, mother, and paternal grandmother; Hypertension in her maternal grandmother and mother; Stroke in her maternal grandfather; Varicose Veins in her father. There is no history of Colon cancer, Stomach cancer, Esophageal cancer, or Rectal cancer.  ROS:   Please see the history of present illness.     All other systems reviewed and are negative.  EKGs/Labs/Other Studies Reviewed:    The following studies were reviewed today:   EKG:    April 11, 2021: Sinus bradycardia 48 beats a minute.  No ST or T wave changes.  Recent Labs: 10/26/2020: ALT 18; BUN 13; Creatinine, Ser 0.69; Hemoglobin 13.4; Platelets 352.0; Potassium 4.2; Sodium 140  Recent Lipid Panel    Component Value Date/Time   CHOL 115 10/26/2020 0917   TRIG 88.0 10/26/2020 0917   HDL  43.70 10/26/2020 0917   CHOLHDL 3 10/26/2020 0917   VLDL 17.6 10/26/2020 0917   LDLCALC 54 10/26/2020 0917    Physical Exam: Blood pressure 122/72, pulse (!) 48, height 5\' 5"  (1.651 m), weight 178 lb 6.4 oz (80.9 kg), SpO2 98 %.  GEN:  Well nourished, well developed in no acute distress HEENT: Normal NECK: No JVD; No carotid bruits LYMPHATICS: No  lymphadenopathy CARDIAC: RRR , no murmurs, rubs, gallops RESPIRATORY:  Clear to auscultation without rales, wheezing or rhonchi  ABDOMEN: Soft, non-tender, non-distended MUSCULOSKELETAL:  No edema; No deformity  SKIN: Warm and dry NEUROLOGIC:  Alert and oriented x 3    ASSESSMENT:    1. Palpitations    PLAN:       Palpitations:    She has been found to have frequent premature ventricular contractions and occasional episodes of atrial tachycardia with a heart rate around 140 beats a minute.  Still very symptomatic with her palpitations.  I discussed with Dr. Quentin Ore. We will get an echocardiogram to make sure that her LV function is normal. Will start Rythmol SR 225 mg twice a day We will get a GXT approxi-1 to 2 weeks after she starts her Rythmol  If the rythmol SR is too expensive and she cannot get it,  I will set her up an appt with Dr. Quentin Ore for consultation .   If this does not resolve her's palpitations we will have her see Dr. Quentin Ore for consultation.  Medication Adjustments/Labs and Tests Ordered: Current medicines are reviewed at length with the patient today.  Concerns regarding medicines are outlined above.  Orders Placed This Encounter  Procedures   Exercise Tolerance Test   EKG 12-Lead   ECHOCARDIOGRAM COMPLETE    No orders of the defined types were placed in this encounter.    Patient Instructions  Medication Instructions:  1) START Rhythmol SR 225mg  twice daily.  DO NOT start until AFTER your echocardiogram.    *If you need a refill on your cardiac medications before your next appointment, please  call your pharmacy*   Lab Work: None  If you have labs (blood work) drawn today and your tests are completely normal, you will receive your results only by: [place] (if you have MyChart) OR A paper copy in the mail If you have any lab test that is abnormal or we need to change your treatment, we will call you to review the results.   Testing/Procedures: Your physician has requested that you have an echocardiogram. Echocardiography is a painless test that uses sound waves to create images of your heart. It provides your doctor with information about the size and shape of your heart and how well your heart's chambers and valves are working. This procedure takes approximately one hour. There are no restrictions for this procedure.  Your physician has requested that you have an exercise tolerance test 1 week after start Rythmol. For further information please visit [email or web address]. Please also follow instruction sheet, as given.   Follow-Up: At [facility], you and your health needs are our priority.  As part of our continuing mission to provide you with exceptional heart care, we have created designated Provider Care Teams.  These Care Teams include your primary Cardiologist (physician) and Advanced Practice Providers (APPs -  Physician Assistants and Nurse Practitioners) who all work together to provide you with the care you need, when you need it.  We recommend signing up for the patient portal called "MyChart".  Sign up information is provided on this After Visit Summary.  MyChart is used to connect with patients for Virtual Visits (Telemedicine).  Patients are able to view lab/test results, encounter notes, upcoming appointments, etc.  Non-urgent messages can be sent to your provider as well.   To learn more about what you can do with MyChart, go to [email or web address].    Your next appointment:   2-3 month(s)  The format for your next appointment:   In  Person  Provider:   Mertie Moores, MD     Other Instructions     Signed, Mertie Moores, MD  04/11/2021 4:45 PM    [place]

## 2021-04-11 NOTE — Patient Instructions (Signed)
Medication Instructions:  1) START Rhythmol SR 225mg  twice daily.  DO NOT start until AFTER your echocardiogram.    *If you need a refill on your cardiac medications before your next appointment, please call your pharmacy*   Lab Work: None  If you have labs (blood work) drawn today and your tests are completely normal, you will receive your results only by: [place] (if you have MyChart) OR A paper copy in the mail If you have any lab test that is abnormal or we need to change your treatment, we will call you to review the results.   Testing/Procedures: Your physician has requested that you have an echocardiogram. Echocardiography is a painless test that uses sound waves to create images of your heart. It provides your doctor with information about the size and shape of your heart and how well your heart's chambers and valves are working. This procedure takes approximately one hour. There are no restrictions for this procedure.  Your physician has requested that you have an exercise tolerance test 1 week after start Rythmol. For further information please visit [email or web address]. Please also follow instruction sheet, as given.   Follow-Up: At [facility], you and your health needs are our priority.  As part of our continuing mission to provide you with exceptional heart care, we have created designated Provider Care Teams.  These Care Teams include your primary Cardiologist (physician) and Advanced Practice Providers (APPs -  Physician Assistants and Nurse Practitioners) who all work together to provide you with the care you need, when you need it.  We recommend signing up for the patient portal called "MyChart".  Sign up information is provided on this After Visit Summary.  MyChart is used to connect with patients for Virtual Visits (Telemedicine).  Patients are able to view lab/test results, encounter notes, upcoming appointments, etc.  Non-urgent messages can be sent to your  provider as well.   To learn more about what you can do with MyChart, go to [email or web address].    Your next appointment:   2-3 month(s)  The format for your next appointment:   In Person  Provider:   Mertie Moores, MD     Other Instructions

## 2021-04-17 ENCOUNTER — Telehealth: Payer: Self-pay | Admitting: Cardiovascular Disease

## 2021-04-17 NOTE — Telephone Encounter (Signed)
*  STAT* If patient is at the pharmacy, call can be transferred to refill team.   1. Which medications need to be refilled? (please list name of each medication and dose if known) Rythmol SR 225 mg   2. Which pharmacy/location (including street and city if local pharmacy) is medication to be sent to? [facility] [place] [place] [facility] [address]  3. Do they need a 30 day or 90 day supply? 30 day

## 2021-04-18 NOTE — Telephone Encounter (Signed)
Dr. Acie Fredrickson- did we decide not to send Rhythmol in until she had echo completed?  Her echo is scheduled 12/8.   I know you said we would have to get someone to look at it and see if she is ok to start it. I vaguely remember [place] saying to hold off sending until we knew she could start it.

## 2021-04-20 NOTE — Telephone Encounter (Signed)
Yes,  We are waiting until after the echo to make sure she has normal LV function prior to starting the Propfenone   PN   I spoke with patient and gave her information from Dr Acie Fredrickson.  If patient is to start medication she will need it sent to pharmacy

## 2021-04-27 ENCOUNTER — Other Ambulatory Visit: Payer: Self-pay

## 2021-04-27 ENCOUNTER — Ambulatory Visit (INDEPENDENT_AMBULATORY_CARE_PROVIDER_SITE_OTHER): Payer: BC Managed Care – PPO

## 2021-04-27 DIAGNOSIS — R002 Palpitations: Secondary | ICD-10-CM

## 2021-04-27 LAB — ECHOCARDIOGRAM COMPLETE
AR max vel: 2.12 cm2
AV Area VTI: 2.08 cm2
AV Area mean vel: 2.01 cm2
AV Mean grad: 3 mmHg
AV Peak grad: 6.9 mmHg
Ao pk vel: 1.31 m/s
Area-P 1/2: 3.39 cm2
Calc EF: 66.1 %
S' Lateral: 2.59 cm
Single Plane A2C EF: 67.8 %
Single Plane A4C EF: 63.2 %

## 2021-05-01 MED ORDER — PROPAFENONE HCL ER 225 MG PO CP12: 225.0000 mg | ORAL_CAPSULE | Freq: Two times a day (BID) | ORAL | 11 refills | Status: DC

## 2021-05-01 NOTE — Addendum Note (Signed)
Addended by: Darrell Jewel on: 05/01/2021 04:19 PM   Modules accepted: Orders

## 2021-05-01 NOTE — Telephone Encounter (Signed)
Patient was calling back to get that prescription put in since the echo is back. Please advise

## 2021-05-01 NOTE — Telephone Encounter (Signed)
[facility]  Verbalized understanding.

## 2021-05-05 ENCOUNTER — Telehealth: Payer: Self-pay | Admitting: Cardiovascular Disease

## 2021-05-05 NOTE — Telephone Encounter (Signed)
Pt c/o medication issue:  1. Name of Medication: Rhythmol SR 225mg   2. How are you currently taking this medication (dosage and times per day)? Not currently taking   3. Are you having a reaction (difficulty breathing--STAT)? No   4. What is your medication issue? Tamara [name] is calling canceling her stress test and stating this medication cost $90 to refill. Due to this and the side effects she saw she does not wish to start this medication at this time. She reports she will talk to Dr. Acie Fredrickson about it in Jan at her appt.

## 2021-05-05 NOTE — Telephone Encounter (Signed)
Spoke with pt who states she declines taking Rhythmol SR 225mg  due to research of side effects.  She has canceled her stress test.  She reports she continues to have palpitations and will plan to keep her January appointment with Dr Acie Fredrickson to discuss further at that time.

## 2021-06-12 ENCOUNTER — Encounter: Payer: Self-pay | Admitting: Cardiovascular Disease

## 2021-06-12 NOTE — Progress Notes (Signed)
Cardiology Office Note:    Date:  5/85/2778   ID:  Tamara [name], DOB [birth year], MRN [id number]  PCP:  Katherina Mires, MD  Cardiologist:  Mertie Moores, MD   Referring MD: No ref. provider found   Chief Complaint  Patient presents with   Palpitations            Tamara [name] is a 46 y.o. female with a hx of palpitatoins  palps for the past year.  Tend to occur more in the evening .  Also has them in the am .   Has them weekly .  Not known to be hormonal .  Does not exercise.   Not related to exertional activities. Last for a few seconds   Has rare episodes of CP with exertion  Also has pain in her chest at rest.    No real associated CP or dyspnea.   Feels the need to take a deep breath  Has slow HR at night   Had a slow HR in high school Non smoker  + family hx  monther has HTN, Grandmother has HTN Mat. Grandfather had a CVA    Has had lots of fatigue  tsh has been normal    Works as a Psychologist, clinical at [employer]   May 26, 8674:  Tamara [name] is seen back today for follow-up visit.  She had a event monitor which revealed normal sinus rhythm and occasional premature ventricular contractions.  She had rare episodes of atrial tachycardia but these were nonsustained and not all that fast.  She had no arrhythmias to suggest an etiology for her syncope.  She seems to be slightly better   Nov. 22, 2022: Still having palpitations Especially at night  Occurs daily  Has symptoms that sound like PVCs and also atrial tach  Her apple watch suggest atrial fib.  This episode may have lasted a minute or so  Has occasional episodes of palps  Also has episodes of CP  Might last 30 minutes.  Worse with breathing in   Walks 3 times a week .  Perhaps  1-2 miles a day .   No CP with exercise .   Still very symptomatic with her palpitations.  I discussed with Dr. Quentin Ore. We will get an echocardiogram to make sure that her LV function is  normal. Will start Rythmol SR 225 mg twice a day We will get a GXT approxi-1 to 2 weeks after she starts her Rythmol   Jan. 24, 1948 Tamara [name] is seen for follow up visit for her atrial tachyardia. We had suggested that she try Flecainide - she decided not to start it     Past Medical History:  Diagnosis Date   Anal fissure    Chest tightness 07/22/2012   Constipation 01/05/2014   Hyperplastic colon polyp    Hyperplastic colon polyp    Internal hemorrhoids    Psoriasis    sees dermatologist   Sinus bradycardia    Varicose veins     Past Surgical History:  Procedure Laterality Date   ANAL RECTAL MANOMETRY N/A 03/30/2016   Procedure: ANO RECTAL MANOMETRY;  Surgeon: Mauri Pole, MD;  Location: [facility];  Service: Endoscopy;  Laterality: N/A;   CESAREAN SECTION     2 c sections   ENDOVENOUS ABLATION SAPHENOUS VEIN W/ LASER Right 04-07-2015   endovenous laser ablation Right GSV by Curt Jews MD   MOLE REMOVAL     TUBAL LIGATION  WISDOM TOOTH EXTRACTION      Current Medications: Current Meds  Medication Sig   cetirizine (ZYRTEC) 10 MG tablet Take 10 mg by mouth daily.   clobetasol ointment (TEMOVATE) 1.74 % Apply 1 application topically as needed.   desonide (DESOWEN) 0.05 % cream Apply topically 2 (two) times daily as needed.   diclofenac (VOLTAREN) 75 MG EC tablet Take 1 tablet (75 mg total) by mouth 2 (two) times daily as needed (B/L hip pain).   docusate sodium (COLACE) 100 MG capsule Take 1 capsule by mouth daily as needed for constipation.   famotidine (PEPCID) 20 MG tablet TAKE 1 TABLET (20 MG TOTAL) BY MOUTH DAILY. AS NEEDED   fluocinonide (LIDEX) 0.05 % external solution Apply 1 application topically daily.   fluticasone (FLONASE) 50 MCG/ACT nasal spray    omeprazole (PRILOSEC) 20 MG capsule Take 1 capsule (20 mg total) by mouth daily.   ondansetron (ZOFRAN) 4 MG tablet Take 1 tablet (4 mg total) by mouth every 8 (eight) hours as needed for nausea or  vomiting.   Vitamin D, Ergocalciferol, (DRISDOL) 1.25 MG (50000 UNIT) CAPS capsule Take 1 capsule (50,000 Units total) by mouth every 7 (seven) days.   [DISCONTINUED] propafenone (RYTHMOL SR) 225 MG 12 hr capsule Take 1 capsule (225 mg total) by mouth 2 (two) times daily.     Allergies:   Patient has no known allergies.   Social History   Socioeconomic History   Marital status: Married    Spouse name: Not on file   Number of children: 2   Years of education: Not on file   Highest education level: Not on file  Occupational History   Occupation: Psychologist, clinical    Employer: [employer]  Tobacco Use   Smoking status: Never   Smokeless tobacco: Never  Vaping Use   Vaping Use: Never used  Substance and Sexual Activity   Alcohol use: No    Alcohol/week: 0.0 standard drinks   Drug use: No   Sexual activity: Yes    Partners: Male  Other Topics Concern   Not on file  Social History Narrative   ork or School: Psychologist, clinical      Home Situation: lives with husband and son and daughter and granmother      Spiritual Beliefs: Christian      Lifestyle: no regular CV exercise; diet is good         Social Determinants of Radio broadcast assistant Strain: Not on file  Food Insecurity: Not on file  Transportation Needs: Not on file  Physical Activity: Not on file  Stress: Not on file  Social Connections: Not on file     Family History: The patient's family history includes Alcohol abuse in her father and mother; Breast cancer in her maternal grandmother; Colon polyps in her maternal grandmother; Congestive Heart Failure in her paternal grandmother; Heart disease in her maternal grandfather, maternal grandmother, mother, and paternal grandmother; Hypertension in her maternal grandmother and mother; Stroke in her maternal grandfather; Varicose Veins in her father. There is no history of Colon cancer, Stomach cancer, Esophageal cancer, or Rectal  cancer.  ROS:   Please see the history of present illness.     All other systems reviewed and are negative.  EKGs/Labs/Other Studies Reviewed:    The following studies were reviewed today:   EKG:       Recent Labs: 10/26/2020: ALT 18; BUN 13; Creatinine, Ser 0.69; Hemoglobin 13.4; Platelets 352.0; Potassium  4.2; Sodium 140  Recent Lipid Panel    Component Value Date/Time   CHOL 115 10/26/2020 0917   TRIG 88.0 10/26/2020 0917   HDL 43.70 10/26/2020 0917   CHOLHDL 3 10/26/2020 0917   VLDL 17.6 10/26/2020 0917   LDLCALC 54 10/26/2020 0917   Physical Exam: Blood pressure 128/74, pulse (!) 53, height 5\' 5"  (1.651 m), weight 181 lb 9.6 oz (82.4 kg), SpO2 98 %.  GEN:  Well nourished, well developed in no acute distress HEENT: Normal NECK: No JVD; No carotid bruits LYMPHATICS: No lymphadenopathy CARDIAC: RRR no murmurs, rubs, gallops RESPIRATORY:  Clear to auscultation without rales, wheezing or rhonchi  ABDOMEN: Soft, non-tender, non-distended MUSCULOSKELETAL:  No edema; No deformity  SKIN: Warm and dry NEUROLOGIC:  Alert and oriented x 3    ASSESSMENT:    1. Atrial tachycardia (HCC)     PLAN:       Atrial tachycardia / Palpitations:    she has brief runs of atrial tach 5-6 beat runs.   She did not want to start rhythmol.   For now, we will just follow.  Will check TSH  - last checked 4 years ago .   These are benign and should not cause  any significant long term problems .   Will see her again in 1 year .      Medication Adjustments/Labs and Tests Ordered: Current medicines are reviewed at length with the patient today.  Concerns regarding medicines are outlined above.  Orders Placed This Encounter  Procedures   TSH    No orders of the defined types were placed in this encounter.     Patient Instructions  Medication Instructions:  Your physician recommends that you continue on your current medications as directed. Please refer to the Current Medication  list given to you today.  *If you need a refill on your cardiac medications before your next appointment, please call your pharmacy*   Lab Work: TODAY:  TSH  If you have labs (blood work) drawn today and your tests are completely normal, you will receive your results only by: [place] (if you have MyChart) OR A paper copy in the mail If you have any lab test that is abnormal or we need to change your treatment, we will call you to review the results.   Testing/Procedures: None ordered   Follow-Up: At [facility], you and your health needs are our priority.  As part of our continuing mission to provide you with exceptional heart care, we have created designated Provider Care Teams.  These Care Teams include your primary Cardiologist (physician) and Advanced Practice Providers (APPs -  Physician Assistants and Nurse Practitioners) who all work together to provide you with the care you need, when you need it.  We recommend signing up for the patient portal called "MyChart".  Sign up information is provided on this After Visit Summary.  MyChart is used to connect with patients for Virtual Visits (Telemedicine).  Patients are able to view lab/test results, encounter notes, upcoming appointments, etc.  Non-urgent messages can be sent to your provider as well.   To learn more about what you can do with MyChart, go to [email or web address].    Your next appointment:   12 month(s)  The format for your next appointment:   In Person  Provider:   Mertie Moores, MD     Other Instructions     Signed, Mertie Moores, MD  06/13/2021 4:53 PM    [place]  [facility] °

## 2021-06-13 ENCOUNTER — Ambulatory Visit: Payer: BC Managed Care – PPO | Admitting: Cardiovascular Disease

## 2021-06-13 ENCOUNTER — Encounter: Payer: Self-pay | Admitting: Cardiovascular Disease

## 2021-06-13 VITALS — BP 128/74 | HR 53 | Ht 65.0 in | Wt 181.6 lb

## 2021-06-13 DIAGNOSIS — I471 Supraventricular tachycardia: Secondary | ICD-10-CM

## 2021-06-13 NOTE — Patient Instructions (Signed)
Medication Instructions:  Your physician recommends that you continue on your current medications as directed. Please refer to the Current Medication list given to you today.  *If you need a refill on your cardiac medications before your next appointment, please call your pharmacy*   Lab Work: TODAY:  TSH  If you have labs (blood work) drawn today and your tests are completely normal, you will receive your results only by: [place] (if you have MyChart) OR A paper copy in the mail If you have any lab test that is abnormal or we need to change your treatment, we will call you to review the results.   Testing/Procedures: None ordered   Follow-Up: At [facility], you and your health needs are our priority.  As part of our continuing mission to provide you with exceptional heart care, we have created designated Provider Care Teams.  These Care Teams include your primary Cardiologist (physician) and Advanced Practice Providers (APPs -  Physician Assistants and Nurse Practitioners) who all work together to provide you with the care you need, when you need it.  We recommend signing up for the patient portal called "MyChart".  Sign up information is provided on this After Visit Summary.  MyChart is used to connect with patients for Virtual Visits (Telemedicine).  Patients are able to view lab/test results, encounter notes, upcoming appointments, etc.  Non-urgent messages can be sent to your provider as well.   To learn more about what you can do with MyChart, go to [email or web address].    Your next appointment:   12 month(s)  The format for your next appointment:   In Person  Provider:   Mertie Moores, MD     Other Instructions

## 2021-06-14 LAB — TSH: TSH: 1.21 u[IU]/mL (ref 0.450–4.500)

## 2021-11-27 ENCOUNTER — Other Ambulatory Visit: Payer: Self-pay | Admitting: Gastroenterology

## 2021-12-14 ENCOUNTER — Telehealth: Payer: Self-pay

## 2021-12-14 DIAGNOSIS — I83899 Varicose veins of unspecified lower extremities with other complications: Secondary | ICD-10-CM

## 2021-12-14 NOTE — Telephone Encounter (Signed)
Pt called stating that she had an ablation on her R leg in 2016 and was having significant pain in her leg and groin. She reqeusted an appt.  Reviewed pt's chart, returned pt's call for clarification, two identifiers used. Pt is elevating leg, but needs new compression stockings. Appt for [place] and Dr Stanford Breed scheduled for f/u varicose veins. Confirmed understanding.

## 2021-12-18 NOTE — Progress Notes (Unsigned)
[facility] [place]  ASSESSMENT / PLAN: Tamara [name] is a 46 y.o. female with chronic venous insufficiency of right lower extremity causing varicosities, swelling, and discomfort (C3 disease).  Venous duplex is significant for anterior accessory vein reflux, prior greater saphenous vein ablation.  Recommend compression and elevation for symptomatic relief. Follow up with our group in three months to discuss anterior accessory saphenous vein ablation.  CHIEF COMPLAINT: Right leg pain  HISTORY OF PRESENT ILLNESS: Tamara [name] is a 46 y.o. female who is previously undergone greater saphenous vein ablation for chronic venous insufficiency returns the office reporting throbbing discomfort in her right leg.  The pain extends from her thigh down to her calf.  The pain is worse when she is standing or very active.  She is previously compliant with compression but has lapsed.  She does elevate her legs. Past Medical History:  Diagnosis Date   Anal fissure    Chest tightness 07/22/2012   Constipation 01/05/2014   Hyperplastic colon polyp    Hyperplastic colon polyp    Internal hemorrhoids    Psoriasis    sees dermatologist   Sinus bradycardia    Varicose veins     Past Surgical History:  Procedure Laterality Date   ANAL RECTAL MANOMETRY N/A 03/30/2016   Procedure: ANO RECTAL MANOMETRY;  Surgeon: Mauri Pole, MD;  Location: [facility];  Service: Endoscopy;  Laterality: N/A;   CESAREAN SECTION     2 c sections   ENDOVENOUS ABLATION SAPHENOUS VEIN W/ LASER Right 04-07-2015   endovenous laser ablation Right GSV by Curt Jews MD   MOLE REMOVAL     TUBAL LIGATION     WISDOM TOOTH EXTRACTION      Family History  Problem Relation Age of Onset   Heart disease Maternal Grandfather    Stroke Maternal Grandfather    Alcohol abuse Mother    Hypertension Mother    Heart disease Mother    Alcohol abuse Father    Varicose Veins Father    Hypertension Maternal  Grandmother    Breast cancer Maternal Grandmother    Colon polyps Maternal Grandmother    Heart disease Maternal Grandmother    Heart disease Paternal Grandmother        AAA-  After age 35   Congestive Heart Failure Paternal Grandmother    Colon cancer Neg Hx    Stomach cancer Neg Hx    Esophageal cancer Neg Hx    Rectal cancer Neg Hx     Social History   Socioeconomic History   Marital status: Married    Spouse name: Not on file   Number of children: 2   Years of education: Not on file   Highest education level: Not on file  Occupational History   Occupation: Psychologist, clinical    Employer: [employer]  Tobacco Use   Smoking status: Never   Smokeless tobacco: Never  Vaping Use   Vaping Use: Never used  Substance and Sexual Activity   Alcohol use: No    Alcohol/week: 0.0 standard drinks of alcohol   Drug use: No   Sexual activity: Yes    Partners: Male  Other Topics Concern   Not on file  Social History Narrative   ork or School: Psychologist, clinical      Home Situation: lives with husband and son and daughter and granmother      Spiritual Beliefs: Christian      Lifestyle: no regular CV exercise;  diet is good         Social Determinants of Radio broadcast assistant Strain: Not on file  Food Insecurity: Not on file  Transportation Needs: Not on file  Physical Activity: Not on file  Stress: Not on file  Social Connections: Not on file  Intimate Partner Violence: Not on file    No Known Allergies  Current Outpatient Medications  Medication Sig Dispense Refill   cetirizine (ZYRTEC) 10 MG tablet Take 10 mg by mouth daily.     clobetasol ointment (TEMOVATE) 5.42 % Apply 1 application topically as needed.     desonide (DESOWEN) 0.05 % cream Apply topically 2 (two) times daily as needed.     diclofenac (VOLTAREN) 75 MG EC tablet Take 1 tablet (75 mg total) by mouth 2 (two) times daily as needed (B/L hip pain). 60 tablet 3    docusate sodium (COLACE) 100 MG capsule Take 1 capsule by mouth daily as needed for constipation.     famotidine (PEPCID) 20 MG tablet TAKE 1 TABLET (20 MG TOTAL) BY MOUTH DAILY. AS NEEDED 90 tablet 1   fluocinonide (LIDEX) 0.05 % external solution Apply 1 application topically daily.     fluticasone (FLONASE) 50 MCG/ACT nasal spray      omeprazole (PRILOSEC) 20 MG capsule TAKE 1 CAPSULE BY MOUTH EVERY DAY 90 capsule 3   ondansetron (ZOFRAN) 4 MG tablet Take 1 tablet (4 mg total) by mouth every 8 (eight) hours as needed for nausea or vomiting. 30 tablet 1   Vitamin D, Ergocalciferol, (DRISDOL) 1.25 MG (50000 UNIT) CAPS capsule Take 1 capsule (50,000 Units total) by mouth every 7 (seven) days. 5 capsule 2   No current facility-administered medications for this visit.    PHYSICAL EXAM Vitals:   12/19/21 0929  BP: 105/67  Pulse: (!) 52  Resp: 20  Temp: 98.7 F (37.1 C)  SpO2: 99%  Weight: 181 lb (82.1 kg)  Height: '5\' 5"'$  (1.651 m)   Well appearing young woman in no distress Scattered reticular veins about the lower extremities. I cannot visualize the AASV seen on duplex. Mild edema bilaterally    PERTINENT LABORATORY AND RADIOLOGIC DATA  Most recent CBC    Latest Ref Rng & Units 10/26/2020    9:17 AM 10/23/2015    5:41 PM 11/17/2014   10:17 AM  CBC  WBC 4.0 - 10.5 K/uL 6.3  9.1  9.6   Hemoglobin 12.0 - 15.0 g/dL 13.4  13.2  13.2   Hematocrit 36.0 - 46.0 % 38.9  38.2  40.0   Platelets 150.0 - 400.0 K/uL 352.0  357       Most recent CMP    Latest Ref Rng & Units 10/26/2020    9:17 AM 10/23/2015    5:41 PM 04/09/2014   11:04 AM  CMP  Glucose 70 - 99 mg/dL 100  98    BUN 6 - 23 mg/dL 13  11    Creatinine 0.40 - 1.20 mg/dL 0.69  0.71    Sodium 135 - 145 mEq/L 140  139    Potassium 3.5 - 5.1 mEq/L 4.2  3.7    Chloride 96 - 112 mEq/L 104  104    CO2 19 - 32 mEq/L 28  28    Calcium 8.4 - 10.5 mg/dL 9.4  9.3    Total Protein 6.5 - 8.1 g/dL  7.4  6.8   Total Bilirubin 0.3 - 1.2  mg/dL  0.6  0.4  Alkaline Phos 38 - 126 U/L  62  56   AST 0 - 37 U/L '20  21  15   '$ ALT 0 - 35 U/L '18  25  11     '$ Renal function CrCl cannot be calculated (Patient's most recent lab result is older than the maximum 21 days allowed.).  Hgb A1c MFr Bld (%)  Date Value  11/11/2017 5.7    LDL Cholesterol  Date Value Ref Range Status  10/26/2020 54 0 - 99 mg/dL Final    Right leg venous duplex:  - No evidence of deep vein thrombosis from the common femoral through the  popliteal veins.  - No evidence of superficial venous thrombosis.  - The proximal femoral vein is not competent.  - The great saphenous vein remains ablated.  - The small saphenous vein is competent.  - The anterior accessory branch is tortuous and not competent.   Yevonne Aline. Stanford Breed, MD [facility] Phone Number: [phone] 12/19/2021 12:04 PM  Total time spent on preparing this encounter including chart review, data review, collecting history, examining the patient, coordinating care for this established patient, 30 minutes.  Portions of this report may have been transcribed using voice recognition software.  Every effort has been made to ensure accuracy; however, inadvertent computerized transcription errors may still be present.

## 2021-12-19 ENCOUNTER — Ambulatory Visit (HOSPITAL_COMMUNITY)
Admission: RE | Admit: 2021-12-19 | Discharge: 2021-12-19 | Disposition: A | Discharge status: Home / Self Care | Payer: BC Managed Care – PPO | Source: Ambulatory Visit | Attending: Vascular Surgery | Admitting: Vascular Surgery

## 2021-12-19 ENCOUNTER — Ambulatory Visit: Payer: BC Managed Care – PPO | Admitting: Vascular Surgery

## 2021-12-19 ENCOUNTER — Encounter: Payer: Self-pay | Admitting: Vascular Surgery

## 2021-12-19 VITALS — BP 105/67 | HR 52 | Temp 98.7°F | Resp 20 | Ht 65.0 in | Wt 181.0 lb

## 2021-12-19 DIAGNOSIS — I83899 Varicose veins of unspecified lower extremities with other complications: Secondary | ICD-10-CM | POA: Insufficient documentation

## 2021-12-19 DIAGNOSIS — I872 Venous insufficiency (chronic) (peripheral): Secondary | ICD-10-CM | POA: Diagnosis not present

## 2021-12-19 DIAGNOSIS — I83891 Varicose veins of right lower extremities with other complications: Secondary | ICD-10-CM | POA: Diagnosis not present

## 2021-12-19 DIAGNOSIS — I8393 Asymptomatic varicose veins of bilateral lower extremities: Secondary | ICD-10-CM

## 2022-03-21 ENCOUNTER — Ambulatory Visit: Payer: BC Managed Care – PPO | Admitting: Vascular Surgery

## 2022-05-03 ENCOUNTER — Encounter: Payer: Self-pay | Admitting: Cardiology

## 2022-05-03 ENCOUNTER — Ambulatory Visit: Payer: BC Managed Care – PPO | Admitting: Cardiology

## 2022-05-03 VITALS — BP 125/62 | HR 58 | Ht 65.0 in | Wt 182.8 lb

## 2022-05-03 DIAGNOSIS — E0590 Thyrotoxicosis, unspecified without thyrotoxic crisis or storm: Secondary | ICD-10-CM

## 2022-05-03 DIAGNOSIS — R072 Precordial pain: Secondary | ICD-10-CM | POA: Insufficient documentation

## 2022-05-03 DIAGNOSIS — E0500 Thyrotoxicosis with diffuse goiter without thyrotoxic crisis or storm: Secondary | ICD-10-CM | POA: Insufficient documentation

## 2022-05-03 DIAGNOSIS — R002 Palpitations: Secondary | ICD-10-CM

## 2022-05-03 NOTE — Progress Notes (Signed)
Patient referred by Katherina Mires, MD for palpitations  Subjective:   Tamara [name], female    DOB: 13-Jan-1976, 46 y.o.   MRN: [id number]   Chief Complaint  Patient presents with   Palpitations   New Patient (Initial Visit)          HPI  46 y.o. Caucasian female with Grave's disease, palpitations, precordial pain  Patient is a Psychologist, clinical. She has had low heart rate and palpitations for a long time, was noted to have brief atrial tachycardia in the past. More recently, she was diagnosed with Grave's disease in 03/2022. Since then, she has been on atenolol and methimazole, followed by Dr. Buddy Duty. Last week she had an episode of palpitations lasting for <102mn. Apple watch reportedly showed Afib, see media below. On a separate note, patient has retrosternal episodes of chest pain, unrelated to exertion.    Past Medical History:  Diagnosis Date   Anal fissure    Chest tightness 07/22/2012   Constipation 01/05/2014   Hyperplastic colon polyp    Hyperplastic colon polyp    Internal hemorrhoids    Psoriasis    sees dermatologist   Sinus bradycardia    Varicose veins      Past Surgical History:  Procedure Laterality Date   ANAL RECTAL MANOMETRY N/A 03/30/2016   Procedure: ANO RECTAL MANOMETRY;  Surgeon: KMauri Pole MD;  Location: [facility];  Service: Endoscopy;  Laterality: N/A;   CESAREAN SECTION     2 c sections   ENDOVENOUS ABLATION SAPHENOUS VEIN W/ LASER Right 04-07-2015   endovenous laser ablation Right GSV by TCurt JewsMD   MOLE REMOVAL     TUBAL LIGATION     WISDOM TOOTH EXTRACTION       Social History   Tobacco Use  Smoking Status Never  Smokeless Tobacco Never    Social History   Substance and Sexual Activity  Alcohol Use No   Alcohol/week: 0.0 standard drinks of alcohol     Family History  Problem Relation Age of Onset   Alcohol abuse Mother    Hypertension Mother    Heart disease Mother    Alcohol abuse Father     Varicose Veins Father    Hypertension Maternal Grandmother    Breast cancer Maternal Grandmother    Colon polyps Maternal Grandmother    Heart disease Maternal Grandmother    Heart disease Maternal Grandfather    Stroke Maternal Grandfather    Heart disease Paternal Grandmother        AAA-  After age 44  Congestive Heart Failure Paternal Grandmother    Colon cancer Neg Hx    Stomach cancer Neg Hx    Esophageal cancer Neg Hx    Rectal cancer Neg Hx       Current Outpatient Medications:    atenolol (TENORMIN) 25 MG tablet, Take 25 mg by mouth daily., Disp: , Rfl:    cetirizine (ZYRTEC) 10 MG tablet, Take 10 mg by mouth daily., Disp: , Rfl:    clobetasol ointment (TEMOVATE) 06.83%, Apply 1 application topically as needed., Disp: , Rfl:    desonide (DESOWEN) 0.05 % cream, Apply topically 2 (two) times daily as needed., Disp: , Rfl:    diclofenac (VOLTAREN) 75 MG EC tablet, Take 1 tablet (75 mg total) by mouth 2 (two) times daily as needed (B/L hip pain)., Disp: 60 tablet, Rfl: 3   docusate sodium (COLACE) 100 MG capsule, Take 1 capsule by mouth  daily as needed for constipation., Disp: , Rfl:    fluocinonide (LIDEX) 0.05 % external solution, Apply 1 application topically daily., Disp: , Rfl:    methimazole (TAPAZOLE) 10 MG tablet, Take 10 mg by mouth daily., Disp: , Rfl:    omeprazole (PRILOSEC) 20 MG capsule, TAKE 1 CAPSULE BY MOUTH EVERY DAY, Disp: 90 capsule, Rfl: 3   ondansetron (ZOFRAN) 4 MG tablet, Take 1 tablet (4 mg total) by mouth every 8 (eight) hours as needed for nausea or vomiting., Disp: 30 tablet, Rfl: 1   Vitamin D, Ergocalciferol, (DRISDOL) 1.25 MG (50000 UNIT) CAPS capsule, Take 1 capsule (50,000 Units total) by mouth every 7 (seven) days., Disp: 5 capsule, Rfl: 2   Cardiovascular and other pertinent studies:  Reviewed external labs and tests, independently interpreted  EKG 05/03/2022: Sinus bradycardia 50 bpm Low voltage in precordial leads    Media  Information    Recent labs: 03/30/2022: Glucose 96, BUN/Cr 13/0.59. EGFR 114. Na/K 140/4.3. Rest of the CMP normal H/H 13/38. MCV 89. Platelets 382 TSH 0.007 very low Free T4 2.71 high (0.82-1.77)  10/2021: Chol 124, TG 94, HDL 46, LDL 60   Review of Systems  Cardiovascular:  Positive for chest pain and palpitations. Negative for dyspnea on exertion, leg swelling and syncope.        Vitals:   05/03/22 0941  BP: 125/62  Pulse: (!) 58  SpO2: 99%     Body mass index is 30.42 kg/m. Filed Weights   05/03/22 0941  Weight: 182 lb 12.8 oz (82.9 kg)     Objective:   Physical Exam Vitals and nursing note reviewed.  Constitutional:      General: She is not in acute distress. Neck:     Vascular: No JVD.  Cardiovascular:     Rate and Rhythm: Normal rate and regular rhythm.     Heart sounds: Normal heart sounds. No murmur heard. Pulmonary:     Effort: Pulmonary effort is normal.     Breath sounds: Normal breath sounds. No wheezing or rales.  Musculoskeletal:     Right lower leg: No edema.     Left lower leg: No edema.         Visit diagnoses:   ICD-10-CM   1. Palpitations  R00.2 EKG 12-Lead    LONG TERM MONITOR (3-14 DAYS)    2. Graves disease  E05.00     3. Hyperthyroidism  E05.90     4. Precordial pain  R07.2 PCV CARDIAC STRESS TEST       Orders Placed This Encounter  Procedures   EKG 12-Lead     Medication changes this visit: Medications Discontinued During This Encounter  Medication Reason   famotidine (PEPCID) 20 MG tablet Completed Course   fluticasone (FLONASE) 50 MCG/ACT nasal spray     No orders of the defined types were placed in this encounter.    Assessment & Recommendations:   46 y.o. Caucasian female with Grave's disease, palpitations, precordial pain  Precordial pain: Less likely cardiac. Will check exercise treadmill stress test.  Calcium score 0. Low suspicion of CAD.  Palpitations: Sinus bradycardia with occasional PAC  on EKG. Apple watch tracing likely PAC and not Afib. Will check 2 cardiac telemetry.  Will also enroll in Cardi[employer]s monitoring of Apple watch tracing. Continue treatment of Grave's disease as per Dr. Buddy Duty.   Thank you for referring the patient to [place]. Please feel free to contact with any questions.   Nigel Mormon, MD Pager:  [phone] Office: [phone]

## 2022-05-23 ENCOUNTER — Ambulatory Visit: Payer: BC Managed Care – PPO | Admitting: Vascular Surgery

## 2022-05-25 ENCOUNTER — Ambulatory Visit: Payer: BC Managed Care – PPO

## 2022-05-25 DIAGNOSIS — R072 Precordial pain: Secondary | ICD-10-CM

## 2022-05-25 DIAGNOSIS — R002 Palpitations: Secondary | ICD-10-CM

## 2022-07-05 ENCOUNTER — Ambulatory Visit: Payer: BC Managed Care – PPO | Admitting: Cardiology

## 2022-07-05 ENCOUNTER — Encounter: Payer: Self-pay | Admitting: Cardiology

## 2022-07-05 VITALS — BP 104/51 | HR 52 | Ht 65.0 in | Wt 186.2 lb

## 2022-07-05 DIAGNOSIS — I491 Atrial premature depolarization: Secondary | ICD-10-CM | POA: Insufficient documentation

## 2022-07-05 NOTE — Progress Notes (Signed)
Patient referred by Katherina Mires, MD for palpitations  Subjective:   Tamara [name], female    DOB: May 14, 1976, 47 y.o.   MRN: [id number]   Chief Complaint  Patient presents with   Palpitations   Follow-up    2 month     HPI  47 y.o. Caucasian female with Grave's disease, palpitations, precordial pain  Reviewed recent test results with the patient, details below.  Patient continues to have short lasting episodes of palpitations few times a week, usually at rest.  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 More recently, she was diagnosed with Grave's disease in 03/2022. Since then, she has been on atenolol and methimazole, followed by Dr. Buddy Duty. Last week she had an episode of palpitations lasting for <52mn. Apple watch reportedly showed Afib, see media below. On a separate note, patient has retrosternal episodes of chest pain, unrelated to exertion.    Current Outpatient Medications:    atenolol (TENORMIN) 25 MG tablet, Take 12.5 mg by mouth daily., Disp: , Rfl:    cetirizine (ZYRTEC) 10 MG tablet, Take 10 mg by mouth daily., Disp: , Rfl:    clobetasol ointment (TEMOVATE) 0AB-123456789%, Apply 1 application topically as needed., Disp: , Rfl:    desonide (DESOWEN) 0.05 % cream, Apply topically 2 (two) times daily as needed., Disp: , Rfl:    diclofenac (VOLTAREN) 75 MG EC tablet, Take 1 tablet (75 mg total) by mouth 2 (two) times daily as needed (B/L hip pain)., Disp: 60 tablet, Rfl: 3   docusate sodium (COLACE) 100 MG capsule, Take 1 capsule by mouth daily as needed for constipation., Disp: , Rfl:    fluocinonide (LIDEX) 0.05 % external solution, Apply 1 application topically daily., Disp: , Rfl:    methimazole (TAPAZOLE) 10 MG tablet, Take 10 mg by mouth daily., Disp: , Rfl:    omeprazole (PRILOSEC) 20 MG capsule, TAKE 1 CAPSULE BY  MOUTH EVERY DAY, Disp: 90 capsule, Rfl: 3   ondansetron (ZOFRAN) 4 MG tablet, Take 1 tablet (4 mg total) by mouth every 8 (eight) hours as needed for nausea or vomiting., Disp: 30 tablet, Rfl: 1   Vitamin D, Ergocalciferol, (DRISDOL) 1.25 MG (50000 UNIT) CAPS capsule, Take 1 capsule (50,000 Units total) by mouth every 7 (seven) days., Disp: 5 capsule, Rfl: 2   Cardiovascular and other pertinent studies:  Reviewed external labs and tests, independently interpreted  Exercise treadmill stress test 05/25/2022: Exercise treadmill stress test performed using Bruce protocol.  Patient reached 10.1 METS, and 86% of age predicted maximum heart rate.  Exercise capacity was excellent.  No chest pain reported.  Normal heart rate and hemodynamic response. Stress EKG revealed no ischemic changes. Low risk study.   Mobile cardiac telemetry 14 days 05/25/2022 - 06/08/2022: Dominant rhythm: Sinus. HR 37-107 bpm. Avg HR 57 bpm. 0 episodes of SVT. <1% isolated SVE, couplets. 0 episodes of VT. <1% isolated VE, couplets. No atrial fibrillation/atrial flutter/SVT/VT/high grade AV block, sinus pause >3sec noted. 19 patient triggered events, sinus rhythm with or without SVE.   EKG 05/03/2022: Sinus bradycardia 50 bpm Low voltage in precordial leads    Media Information    Recent labs: 03/30/2022: Glucose 96, BUN/Cr 13/0.59. EGFR 114. Na/K 140/4.3. Rest of the CMP normal H/H 13/38. MCV 89. Platelets 382 TSH 0.007 very low Free T4 2.71 high (0.82-1.77)  10/2021: Chol 124, TG 94, HDL 46, LDL 60   Review of Systems  Cardiovascular:  Positive for palpitations. Negative for chest pain, dyspnea on exertion, leg swelling and syncope.        Vitals:   07/05/22 1526  BP: (!) 104/51  Pulse: (!) 52  SpO2: 95%     Body mass index is 30.99 kg/m. Filed Weights   07/05/22 1526  Weight: 186 lb 3.2 oz (84.5 kg)     Objective:   Physical Exam Vitals and nursing note reviewed.  Constitutional:       General: She is not in acute distress. Neck:     Vascular: No JVD.  Cardiovascular:     Rate and Rhythm: Normal rate and regular rhythm.     Heart sounds: Normal heart sounds. No murmur heard. Pulmonary:     Effort: Pulmonary effort is normal.     Breath sounds: Normal breath sounds. No wheezing or rales.  Musculoskeletal:     Right lower leg: No edema.     Left lower leg: No edema.         Visit diagnoses:   ICD-10-CM   1. Premature atrial contraction  I49.1          Assessment & Recommendations:   47 y.o. Caucasian female with Grave's disease, palpitations, precordial pain  Precordial pain: Calcium score 0. Normal exercise stress test. Consider noncardiac cause.  Palpitations: Sinus bradycardia with occasional PAC on EKG. Cardiac telemetry shows <1% PAC/PVC, no other arrhythmia.  Continue treatment of Grave's disease as per Dr. Buddy Duty. Given her resting bradycardia, I would avoid beta blocker or calcium channel blocker for treatment, but reassured the patient that no serious arrhythmia was found on the above workup.    Thank you for referring the patient to [place]. Please feel free to contact with any questions.   Nigel Mormon, MD Pager: [phone] Office: [phone]

## 2022-12-27 ENCOUNTER — Encounter: Payer: Self-pay | Admitting: *Deleted

## 2022-12-31 ENCOUNTER — Institutional Professional Consult (permissible substitution): Payer: BC Managed Care – PPO | Admitting: Neurology

## 2023-02-02 ENCOUNTER — Other Ambulatory Visit: Payer: Self-pay | Admitting: Gastroenterology

## 2023-02-14 ENCOUNTER — Ambulatory Visit: Payer: BC Managed Care – PPO | Admitting: Plastic Surgery

## 2023-02-14 ENCOUNTER — Encounter: Payer: Self-pay | Admitting: Plastic Surgery

## 2023-02-14 VITALS — BP 122/78 | HR 55 | Ht 65.0 in | Wt 184.0 lb

## 2023-02-14 DIAGNOSIS — L989 Disorder of the skin and subcutaneous tissue, unspecified: Secondary | ICD-10-CM | POA: Diagnosis not present

## 2023-02-14 NOTE — Progress Notes (Signed)
Referring Provider Macy Mis, MD [address] [place],  [place] [address]   CC:  Chief Complaint  Patient presents with   Consult           Tamara [name] is an 47 y.o. female.  HPI: Tamara [name] is a 47 year old female referred for evaluation of several small skin lesions on her face.  She has been told by another provider that these are worrisome due to the fact that they have arisen after age 89.  She would like to have them removed for pathologic diagnosis.  No Known Allergies  Outpatient Encounter Medications as of 02/14/2023  Medication Sig   cetirizine (ZYRTEC) 10 MG tablet Take 10 mg by mouth daily.   clobetasol ointment (TEMOVATE) 0.05 % Apply 1 application topically as needed.   desonide (DESOWEN) 0.05 % cream Apply topically 2 (two) times daily as needed.   docusate sodium (COLACE) 100 MG capsule Take 1 capsule by mouth daily as needed for constipation.   fluocinonide (LIDEX) 0.05 % external solution Apply 1 application topically daily.   methimazole (TAPAZOLE) 10 MG tablet Take 10 mg by mouth daily.   omeprazole (PRILOSEC) 20 MG capsule TAKE 1 CAPSULE BY MOUTH EVERY DAY   atenolol (TENORMIN) 25 MG tablet Take 25 mg by mouth daily.   diclofenac (VOLTAREN) 75 MG EC tablet Take 1 tablet (75 mg total) by mouth 2 (two) times daily as needed (B/L hip pain).   ondansetron (ZOFRAN) 4 MG tablet Take 1 tablet (4 mg total) by mouth every 8 (eight) hours as needed for nausea or vomiting.   Vitamin D, Ergocalciferol, (DRISDOL) 1.25 MG (50000 UNIT) CAPS capsule Take 1 capsule (50,000 Units total) by mouth every 7 (seven) days.   No facility-administered encounter medications on file as of 02/14/2023.     Past Medical History:  Diagnosis Date   Anal fissure    Atrial tachycardia    Dr. Elease Hashimoto, cardiology   Bradycardia    Chest pain with minimal risk for cardiac etiology    Chest tightness 07/22/2012   Constipation 01/05/2014   Fatigue    Graves disease     Hyperplastic colon polyp    Hyperplastic colon polyp    Internal hemorrhoids    Psoriasis    sees dermatologist   Sinus bradycardia    Snoring    Varicose veins     Past Surgical History:  Procedure Laterality Date   ANAL RECTAL MANOMETRY N/A 03/30/2016   Procedure: ANO RECTAL MANOMETRY;  Surgeon: Napoleon Form, MD;  Location: [facility];  Service: Endoscopy;  Laterality: N/A;   CESAREAN SECTION     2 c sections   ENDOVENOUS ABLATION SAPHENOUS VEIN W/ LASER Right 04-07-2015   endovenous laser ablation Right GSV by Gretta Began MD   MOLE REMOVAL     TUBAL LIGATION     WISDOM TOOTH EXTRACTION      Family History  Problem Relation Age of Onset   Alcohol abuse Mother    Hypertension Mother    Heart disease Mother    Alcohol abuse Father    Varicose Veins Father    Hypertension Maternal Grandmother    Breast cancer Maternal Grandmother    Colon polyps Maternal Grandmother    Heart disease Maternal Grandmother    Heart disease Maternal Grandfather    Stroke Maternal Grandfather    Heart disease Paternal Grandmother        AAA-  After age 38   Congestive  Heart Failure Paternal Grandmother    Colon cancer Neg Hx    Stomach cancer Neg Hx    Esophageal cancer Neg Hx    Rectal cancer Neg Hx     Social History   Social History Narrative   ork or School: Warehouse manager      Home Situation: lives with husband and son and daughter and granmother      Spiritual Beliefs: Christian      Lifestyle: no regular CV exercise; diet is good           Review of Systems General: Denies fevers, chills, weight loss CV: Denies chest pain, shortness of breath, palpitations Skin: Patient has 3 obvious lesions on her face that she would like to have removed.  She denies any pain or drainage from the lesions  Physical Exam    02/14/2023   10:57 AM 07/05/2022    3:26 PM 05/03/2022    9:41 AM  Vitals with BMI  Height 5\' 5"  5\' 5"  5\' 5"   Weight 184 lbs 186 lbs 3  oz 182 lbs 13 oz  BMI 30.62 30.99 30.42  Systolic 122 104 161  Diastolic 78 51 62  Pulse 55 52 58    General:  No acute distress,  Alert and oriented, Non-Toxic, Normal speech and affect Integument: Patient has 3 obvious lesions on the face 1 on her forehead and 1 on each cheek.  These are all benign appearing however she is concerned due to the rapid growth of the lesions. Mammogram: 04/2022 BI-RADS 2 Assessment/Plan Skin lesions: Patient is requested that the skin lesions on her face to be excised.  I discussed the procedure and the fact that she will likely have very small scars on her face for these are excised.  The lesions will be removed and sent to pathology for routine examination.  Photographs were obtained today with her consent.  Will schedule her for an in office procedure  Santiago Glad 02/14/2023, 12:53 PM

## 2023-05-13 ENCOUNTER — Ambulatory Visit: Payer: BC Managed Care – PPO | Admitting: Plastic Surgery

## 2023-05-16 ENCOUNTER — Encounter: Payer: Self-pay | Admitting: Plastic Surgery

## 2023-05-16 ENCOUNTER — Ambulatory Visit: Payer: BC Managed Care – PPO | Admitting: Plastic Surgery

## 2023-05-16 VITALS — BP 103/70 | HR 51

## 2023-05-16 DIAGNOSIS — D2239 Melanocytic nevi of other parts of face: Secondary | ICD-10-CM

## 2023-05-16 DIAGNOSIS — L989 Disorder of the skin and subcutaneous tissue, unspecified: Secondary | ICD-10-CM

## 2023-05-16 NOTE — Progress Notes (Signed)
Procedure Note  Preoperative Dx: Multiple facial nevi  Postoperative Dx: Same  Procedure: Excision of 4 facial nevi each approximately 2 mm in size  Anesthesia: Lidocaine 1% with 1:100,000 epinephrine and 0.25% Sensorcaine   Indication for Procedure: Removal for pathologic diagnosis.  Description of Procedure: Risks and complications were explained to the patient including the possibility of scar formation and the need for additional procedures based on pathology..  Consent was confirmed and the patient understands the risks and benefits.  The potential complications and alternatives were explained and the patient consents.  The patient expressed understanding the option of not having the procedure and the risks of a scar.  Time out was called and all information was confirmed to be correct.    The area was prepped and drapped.  Local anesthetic was injected in the subcutaneous tissues.  After waiting for the local to take affect each of the nevi was excised starting with the forehead nevi.  The left cheek and the 2 right cheek nevi were all excised sharply.  Each excision was approximately 5 mm in length..  After obtaining hemostasis, the surgical wound was closed with interrupted 5-0 Prolene sutures.  The total length of the surgical wounds measured 2 cm, each approximately 5 mm in length.  A dressing was applied.  The patient was given instructions on how to care for the area and a follow up appointment.  Gricelda tolerated the procedure well and there were no complications. The specimen was sent to pathology.

## 2023-05-21 LAB — DERMATOLOGY PATHOLOGY

## 2023-05-23 ENCOUNTER — Ambulatory Visit: Payer: 59 | Admitting: Physician Assistant

## 2023-05-23 ENCOUNTER — Encounter: Payer: Self-pay | Admitting: Physician Assistant

## 2023-05-23 VITALS — BP 127/84 | HR 50

## 2023-05-23 DIAGNOSIS — D239 Other benign neoplasm of skin, unspecified: Secondary | ICD-10-CM

## 2023-05-23 NOTE — Progress Notes (Signed)
 Patient is a pleasant 48 year old female s/p excision of multiple facial nevi performed 05/16/2023 by Dr. Waddell who presents to clinic for postprocedural follow-up.  Reviewed procedure note and the excision sites were closed with 5-0 Prolene interrupted sutures.  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   Today, the patient is doing fine from a post procedural standpoint.  She feels prepared for removal of Prolene sutures.  She states that she has a history of skin lesions requiring excision which she attributes to sun exposure without adequate protection.  She had seen the pathology in the MyChart app.  On exam, Prolene sutures are all removed in their entirety without complication or difficulty.  No surrounding irritation or erythema.  Appearing well-healed.  Recommending Vaseline to all 4 of her excision sites daily x 7 days.  After that time, she can transition to silicone scar gel.  Recommending twice daily application x 3 months.  Discussed case with Dr. Waddell and he would like to see patient back for reexcision.  Will schedule reexcision of left cheek dysplastic nevus given peripheral margin involvement.  Picture(s) obtained of the patient and placed in the chart were with the patient's or guardian's permission.

## 2023-06-13 ENCOUNTER — Ambulatory Visit: Payer: 59 | Admitting: Plastic Surgery

## 2023-06-21 ENCOUNTER — Other Ambulatory Visit: Payer: Self-pay | Admitting: Gastroenterology

## 2023-10-01 ENCOUNTER — Ambulatory Visit: Admitting: Cardiology

## 2023-11-12 ENCOUNTER — Encounter: Payer: Self-pay | Admitting: Cardiology

## 2023-11-12 ENCOUNTER — Ambulatory Visit: Attending: Cardiology | Admitting: Cardiology

## 2023-11-12 VITALS — BP 104/74 | HR 54 | Ht 65.0 in | Wt 184.6 lb

## 2023-11-12 DIAGNOSIS — I491 Atrial premature depolarization: Secondary | ICD-10-CM

## 2023-11-12 DIAGNOSIS — R002 Palpitations: Secondary | ICD-10-CM | POA: Diagnosis not present

## 2023-11-12 NOTE — Patient Instructions (Signed)
 Medication Instructions:  Your physician recommends that you continue on your current medications as directed. Please refer to the Current Medication list given to you today.  *If you need a refill on your cardiac medications before your next appointment, please call your pharmacy*  Lab Work: None ordered.  If you have labs (blood work) drawn today and your tests are completely normal, you will receive your results only by: MyChart Message (if you have MyChart) OR A paper copy in the mail If you have any lab test that is abnormal or we need to change your treatment, we will call you to review the results.  Testing/Procedures: None ordered.    Follow-Up: At [facility], you and your health needs are our priority.  As part of our continuing mission to provide you with exceptional heart care, our providers are all part of one team.  This team includes your primary Cardiologist (physician) and Advanced Practice Providers or APPs (Physician Assistants and Nurse Practitioners) who all work together to provide you with the care you need, when you need it.  Your next appointment:   Follow up with Dr Elmira as needed

## 2023-11-12 NOTE — Progress Notes (Signed)
 Cardiology Office Note:  .   Date:  11/12/2023  ID:  Tamara [name], DOB 02-Oct-1975, MRN [id number] PCP: Rena Luke POUR, MD  [facility] Providers Cardiologist:  Newman Lawrence, MD PCP: Rena Luke POUR, MD  Chief Complaint  Patient presents with   Palpitations     Tamara [name] is a 48 y.o. female with Grave's disease, palpitations  History of Present Illness  Patient was previously seen by me in December 2023, underwent workup including 2-week monitor that showed no significant arrhythmia.  Patient continues to have palpitations that occur mostly at night, several times a week.  She walks about an hour 3 times a week without any complaints of palpitations.  Separately, she has noticed dull ache in her chest that last for 30-60-minute.  Exercise treadmill stress test has been completely normal in 05/2022.      Vitals:   11/12/23 0953  BP: 104/74  Pulse: (!) 54  SpO2: 95%      Review of Systems  Cardiovascular:  Positive for chest pain and palpitations. Negative for dyspnea on exertion, leg swelling and syncope.        Studies Reviewed: SABRA        EKG 11/12/2023: Sinus bradycardia with sinus arrhythmia Low voltage QRS No previous ECGs available    Exercise treadmill stress test 05/25/2022: Exercise treadmill stress test performed using Bruce protocol.  Patient reached 10.1 METS, and 86% of age predicted maximum heart rate.  Exercise capacity was excellent.  No chest pain reported.  Normal heart rate and hemodynamic response. Stress EKG revealed no ischemic changes. Low risk study.   Mobile cardiac telemetry 14 days 05/25/2022 - 06/08/2022: Dominant rhythm: Sinus. HR 37-107 bpm. Avg HR 57 bpm. 0 episodes of SVT. <1% isolated SVE, couplets. 0 episodes of VT. <1% isolated VE, couplets. No atrial fibrillation/atrial flutter/SVT/VT/high grade AV block, sinus pause >3sec noted. 19 patient triggered events, sinus rhythm with or without SVE.    EKG  05/03/2022: Sinus bradycardia 50 bpm Low voltage in precordial leads     Media Information     Physical Exam Vitals and nursing note reviewed.  Constitutional:      General: She is not in acute distress. Neck:     Vascular: No JVD.   Cardiovascular:     Rate and Rhythm: Normal rate and regular rhythm.     Heart sounds: Normal heart sounds. No murmur heard. Pulmonary:     Effort: Pulmonary effort is normal.     Breath sounds: Normal breath sounds. No wheezing or rales.   Musculoskeletal:     Right lower leg: No edema.     Left lower leg: No edema.      VISIT DIAGNOSES:   ICD-10-CM   1. Palpitations  R00.2 EKG 12-Lead    2. Premature atrial contraction  I49.1 EKG 12-Lead       Tamara [name] is a 48 y.o. female with Grave's disease, palpitations Assessment & Plan  Palpitations: Previously reassuring Zio monitor.  No arrhythmia noted on her Apple Watch.  Patient would like to hold off monitor until end of summer.  If she has persistent palpitation symptoms at that time, she will then call our office for a 2-week Zio monitor placement.  Chest pain: Atypical in nature.  10 METS on exercise manage stress test without any ischemia noted in 05/2022.  Patient would hold off chest pain at this time unless it becomes more prominent on exertion.   F/u as needed  Signed, Renny Remer  JINNY Lawrence, MD

## 2024-01-21 ENCOUNTER — Ambulatory Visit: Attending: Cardiology

## 2024-01-21 ENCOUNTER — Other Ambulatory Visit: Payer: Self-pay | Admitting: Cardiology

## 2024-01-21 ENCOUNTER — Telehealth: Payer: Self-pay | Admitting: Cardiology

## 2024-01-21 DIAGNOSIS — R002 Palpitations: Secondary | ICD-10-CM

## 2024-01-21 NOTE — Telephone Encounter (Signed)
 Pt would like to do a provider switch.  She would like a female provider  From: Dr Bruna  To: Tamara [name]

## 2024-01-21 NOTE — Progress Notes (Unsigned)
 Enrolled for [employer] to mail a ZIO XT long term holter monitor to the patients address on file.

## 2024-01-21 NOTE — Telephone Encounter (Signed)
 Called pt reports has had chest aches that come in go.  Can happen while sitting or while up moving around. Episodes can last up to 45 min.  HR while sitting normally 50-60 now 70's with activity such as walking up stairs 90's now 120's and with extensive exercise 120's now up to 155 and while sleep upper 30's now in 50's.  Pt has a hx of thyroid  disease has an OV with endocrinology next week.  PCP obtained labs today for thyroid  function.  Pt had OV with MD in June in note MD advised would order 2 week zio if continued to have palpitations.    Pt denies left arm pain, shoulder pain, nausea, breaking out in a sweat.  Reports has night sweats but expresses may be r/t menopause. BP today 117/49-73.  Pt reports BP normally around 100/70.   Will send to MD to advise if would like to order 2 week monitor or if has other recommendations.

## 2024-01-21 NOTE — Telephone Encounter (Signed)
 Patient c/o Palpitations:  STAT if patient reporting lightheadedness, shortness of breath, or chest pain  How long have you had palpitations/irregular HR/ Afib? Are you having the symptoms now? Much everyday recently   Are you currently experiencing lightheadedness, SOB or CP? Yes she is SOB and seems to get worse.  She stated she has like a ack kinda chest pain that will come and go.  No chest pain right now.    Do you have a history of afib (atrial fibrillation) or irregular heart rhythm? Yes   Have you checked your BP or HR? (document readings if available): Her Heart rate is 73, when she sleep it goes down to 38.  When she went walking it was up to 155.   Are you experiencing any other symptoms? Patient denies any other symptoms as of now.

## 2024-01-21 NOTE — Telephone Encounter (Signed)
 Spoke to patient and she advises that she has had some nausea when laying down at night. Pt reports that nausea is mostly noticed after eating at night and she has not been taking her indigestion medication. Advised patient that she might be causing indigestion. Pt admits that watch has been advising that she is in A. Fib occasionally. Advised that she might be having PAC's or PVC's that could be triggering her watcht to report atrial fibrillation. Per verbal order from Dr. Elmira, pt advised order for 2 week zio patch to be placed. Order placed and to be mailed to patient. Pt agrees with plan of care.

## 2024-03-03 ENCOUNTER — Other Ambulatory Visit (HOSPITAL_COMMUNITY): Payer: Self-pay | Admitting: Internal Medicine

## 2024-03-03 DIAGNOSIS — E0590 Thyrotoxicosis, unspecified without thyrotoxic crisis or storm: Secondary | ICD-10-CM

## 2024-03-03 DIAGNOSIS — E0500 Thyrotoxicosis with diffuse goiter without thyrotoxic crisis or storm: Secondary | ICD-10-CM

## 2024-03-19 ENCOUNTER — Ambulatory Visit: Payer: Self-pay | Admitting: Cardiology

## 2024-03-19 DIAGNOSIS — R002 Palpitations: Secondary | ICD-10-CM | POA: Diagnosis not present

## 2024-03-19 NOTE — Progress Notes (Signed)
 Occasional episodes of short bursts of SVT. Recommend vagal maneuvers.  In addition, if symptoms are frequent, could add metoprolol tartrate 25 mg bid. Of note, patient has requested switch to Dr. Lonni, first appt with her in 05/2024.  Thanks MJP

## 2024-03-23 ENCOUNTER — Encounter (HOSPITAL_COMMUNITY)

## 2024-03-23 ENCOUNTER — Ambulatory Visit (HOSPITAL_COMMUNITY)

## 2024-03-24 ENCOUNTER — Ambulatory Visit (HOSPITAL_COMMUNITY)

## 2024-04-10 NOTE — Progress Notes (Signed)
 Subjective   HPI:  Tamara [name] is a 48 y.o.  female who presents to the office with:  Chief Complaint  Patient presents with  . swollen lymph nodes    Patient has noticed swollen areas on both sides of her abdomen. Patient states it feels like lymph nodes. Patient states she noticed it starting in September. Patient reports that she's been nauseous off & on and has noticed that she's been having SOB that has been going on for a while   She feels dyspneic with brisk walking also described as Mouth breathing   Most of November had URI. With residual congestion and cough.  Intermittently nausea.  Notes a lump on her abdominal wall bilaterally.  No diarrhea or constipation.  No urinary symptoms.    Patient had NM thyroid  scan this week- Endo is following goiter/Graves disease and is undergoing RAI treatment.  Had HFP, TFT on 10.9 and annual labs in September with me- benign.  She completed a Cardiac Monitor from 9/28-10/12 SVT- no new recs from cardiology  Wt Readings from Last 3 Encounters:  04/10/24 184 lb (83.5 kg)  03/26/24 179 lb (81.2 kg)  01/21/24 181 lb 9.6 oz (82.4 kg)    ROS: Negative for constitutional, CV, Resp, Abd/GU symptoms except as noted above in HPI.  PMH: Past medical history, Past surgical history, Social history, family history were reviewed as noted in EMR.  Pertinent for:  Past Medical History:  Diagnosis Date  . Anxiety Adulthood   Mild  . Colon polyp 2012   Removed  . Disease of thyroid  gland 03/2022  . GERD (gastroesophageal reflux disease) 2022   Emdoscopy  . Palpitations   . Psoriasis      Medications and allergies reviewed.   Objective    Physical Exam:  Vital Signs: BP 110/64 (BP Location: Left Upper Arm, Patient Position: Sitting)   Pulse 82   Temp 97.4 F (36.3 C) (Temporal)   Resp 18   Ht 5' 5.5 (1.664 m)   Wt 184 lb (83.5 kg)   LMP  (LMP Unknown)   SpO2 98%   BMI 30.15 kg/m   General:  Well appearing, Alert &  Oriented CV:  Normal to palpation without thrill.  Regular Rate and Rhythm, No murmurs, rubs, or gallops.   Respiratory:  Normal respiratory effort.  No wheezes or crackles.  Good air movement without diminished sounds.   Abdomen:  Normal bowel sounds.  Non-tender to palpation.  No rebound or guarding.  No palpable liver or spleen. Extremities:  No pretibial edema  Assessment/Plan    Orders Placed This Encounter  Procedures  . XR Chest Pa And Lateral   Cough: Will treat sinusitis.bronchitis with Augmentin .  If not improving, will get Chest xray.  Low concern for cardiac cause.  No chest pain, low concern for PE.  Recent CBC not suggestive of anemia.  At this time.  Feeling of dyspnea may be multifactorial with Graves/SVT playing a role.  Will reassess if new or persistent symptoms  Bilateral soft tissue density  notes medial and lateral to umbilicus at the nipple line  no discrete mass- favor normal anatomy.    If symptoms persistent, can consider advanced imaging.   No problem-specific Assessment & Plan notes found for this encounter.   No follow-ups on file.  Future Appointments  Date Time Provider Department Center  05/12/2024  7:30 AM [facility]  06/04/2024  8:30 AM Dittana JAYSON Remington, MD [facility] None  01/22/2025  1:00 PM Luke MARLA Manns, MD [facility] None    Medications at end of visit today: Current Medications[1]        [1]  Current Outpatient Medications:  .  amoxicillin -clavulanate (AUGMENTIN ) 875-125 mg per tablet, Take one tablet by mouth 2 (two) times daily for 7 days., Disp: 14 tablet, Rfl: 0 .  atenolol (TENORMIN) 25 mg tablet, Take one tablet (25 mg dose) by mouth daily., Disp: , Rfl:  .  benzonatate  (TESSALON  PERLES) 100 mg capsule, Take one capsule (100 mg dose) by mouth 3 (three) times a day as needed for Cough., Disp: 30 capsule, Rfl: 0 .  cetirizine (ZYRTEC) 10 mg tablet, Take one tablet (10 mg dose) by mouth daily., Disp: , Rfl:  .   clobetasol (OLUX) 0.05% topical foam, clobetasol 0.05 % topical foam  APPLY TO AFFECTED AREA ONCE A DAY FOR 2 WEEKS  THEN USE ONCE A WEEK FOR MAINTENANCE, Disp: , Rfl:  .  docusate sodium (COLACE,DOK,DOCQLACE) capsule, daily., Disp: , Rfl:  .  fluticasone propionate (FLONASE) 50 mcg/actuation nasal spray, one spray by Nasal route daily., Disp: 16 g, Rfl: 0 .  omeprazole  (PRILOSEC) 20 mg capsule, Take one capsule (20 mg dose) by mouth daily., Disp: 90 capsule, Rfl: 0 .  ondansetron  (ZOFRAN -ODT) 4 mg disintegrating tablet, Take one tablet (4 mg dose) by mouth every 8 (eight) hours as needed., Disp: , Rfl:

## 2024-05-05 ENCOUNTER — Other Ambulatory Visit (HOSPITAL_COMMUNITY): Payer: Self-pay | Admitting: Internal Medicine

## 2024-05-05 DIAGNOSIS — E0590 Thyrotoxicosis, unspecified without thyrotoxic crisis or storm: Secondary | ICD-10-CM

## 2024-05-05 DIAGNOSIS — E0500 Thyrotoxicosis with diffuse goiter without thyrotoxic crisis or storm: Secondary | ICD-10-CM

## 2024-05-25 ENCOUNTER — Encounter (HOSPITAL_COMMUNITY): Payer: Self-pay

## 2024-05-25 ENCOUNTER — Other Ambulatory Visit (HOSPITAL_COMMUNITY)

## 2024-05-26 ENCOUNTER — Other Ambulatory Visit (HOSPITAL_COMMUNITY)

## 2024-05-26 ENCOUNTER — Encounter (HOSPITAL_COMMUNITY): Payer: Self-pay

## 2024-06-01 ENCOUNTER — Ambulatory Visit (HOSPITAL_BASED_OUTPATIENT_CLINIC_OR_DEPARTMENT_OTHER): Admitting: Cardiology

## 2024-06-01 ENCOUNTER — Encounter (HOSPITAL_BASED_OUTPATIENT_CLINIC_OR_DEPARTMENT_OTHER): Payer: Self-pay | Admitting: Cardiology

## 2024-06-01 VITALS — BP 120/66 | HR 58 | Ht 65.0 in | Wt 184.6 lb

## 2024-06-01 DIAGNOSIS — R0609 Other forms of dyspnea: Secondary | ICD-10-CM | POA: Diagnosis not present

## 2024-06-01 DIAGNOSIS — R0789 Other chest pain: Secondary | ICD-10-CM | POA: Diagnosis not present

## 2024-06-01 DIAGNOSIS — Z7189 Other specified counseling: Secondary | ICD-10-CM | POA: Diagnosis not present

## 2024-06-01 DIAGNOSIS — R002 Palpitations: Secondary | ICD-10-CM | POA: Diagnosis not present

## 2024-06-01 DIAGNOSIS — Z712 Person consulting for explanation of examination or test findings: Secondary | ICD-10-CM

## 2024-06-01 DIAGNOSIS — I471 Supraventricular tachycardia, unspecified: Secondary | ICD-10-CM

## 2024-06-01 NOTE — Progress Notes (Signed)
 " Cardiology Office Note:  .   Date:  06/01/2024  ID:  Tonique Mendonca, DOB 12-26-1975, MRN [id number] PCP: Rena Luke POUR, MD  [facility] Providers Cardiologist:  Aleene Passe, MD (Inactive) {  History of Present Illness: .   Tamara [name] is a 49 y.o. female with PMH pSVT, graves disease, palpitations. She was previously seen by Dr. Passe and Dr. Elmira, and she established care with me on 06/01/24.   Pertinent CV history: Treadmill stress 05/25/22 with 10.1 METs, no ischemic changes. Most recent monitor 03/19/24 with 12 brief SVT episodes in 13 days, longest 17.9 seconds. Echo 2022 unremarkable.  Today: Reviewed monitor results together today, looked at tracings together. Brief intermittent pSVT. Discussed options for management. She thought it was NSVT, reviewed difference between NSVT and paroxysmal SVT today as well as the difference between PVCs and atrial ectopy. Also had questions as she was told in the past she would likely need a pacemaker someday, reviewed that there are no indications for this currently, signs/symptoms that would suggest she needed a pacemaker.  She is still having palpitations and chest discomfort, happen at different times. Palpitations are most frequent but not daily, chest discomfort is more weekly, not related to exertion, feels achy, lasts 5-30 minutes, no clear aggravating/alleviating factors. Also notes shortness of breath with exertion separate from her palpitations and chest pain. Notes that she just started radioactive iodine treatment last week for her thyroid , no longer on methimazole.  Use to walk 3x/week in the fall but now doesn't routinely exercise. Discussed exercise tolerance/conditioning at length.  ROS: Denies shortness of breath at rest. No PND, orthopnea, LE edema or unexpected weight gain. No syncope. ROS otherwise negative except as noted.   Studies Reviewed: SABRA    EKG:       Physical Exam:   VS:  BP 120/66   Pulse (!) 58    Ht 5' 5 (1.651 m)   Wt 184 lb 9.6 oz (83.7 kg)   SpO2 97%   BMI 30.72 kg/m    Wt Readings from Last 3 Encounters:  06/01/24 184 lb 9.6 oz (83.7 kg)  11/12/23 184 lb 9.6 oz (83.7 kg)  02/14/23 184 lb (83.5 kg)    GEN: Well nourished, well developed in no acute distress HEENT: Normal, moist mucous membranes NECK: No JVD CARDIAC: regular rhythm, normal S1 and S2, no rubs or gallops. No murmur. VASCULAR: Radial and DP pulses 2+ bilaterally. No carotid bruits RESPIRATORY:  Clear to auscultation without rales, wheezing or rhonchi  ABDOMEN: Soft, non-tender, non-distended MUSCULOSKELETAL:  Ambulates independently SKIN: Warm and dry, no edema NEUROLOGIC:  Alert and oriented x 3. No focal neuro deficits noted. PSYCHIATRIC:  Normal affect    ASSESSMENT AND PLAN: .    Palpitations Paroxysmal SVT -reviewed monitor results together today. Discussed different types of arrhythmias -prior monitors also reviewed -had discussed flecainide with Dr. Passe in the past, elected not to start -discussed options for management. Brief, low burden, unlikely PRN medication would be helpful given brevity, not enough to consider ablation. Discussed preventative medication vs. Monitoring. We both agree to keep the atenolol as is for now and monitor symptoms.  Atypical chest discomfort Dyspnea on exertion -reviewed her prior echo and treadmill, which were reassuring -discussed repeating treadmill stress now vs. Monitoring symptoms. Given that she is undergoing changes to her thyroid  regimen, elected to hold on testing for now, will reassess at follow up -recommended gradual exercise conditioning -reviewed red flag warning signs that need  immediate medical attention  CV risk counseling and prevention -recommend heart healthy/Mediterranean diet, with whole grains, fruits, vegetable, fish, lean meats, nuts, and olive oil. Limit salt. -recommend moderate walking, 3-5 times/week for 30-50 minutes each session.  Aim for at least 150 minutes/week. Goal should be pace of 3 miles/hours, or walking 1.5 miles in 30 minutes -recommend avoidance of tobacco products. Avoid excess alcohol. -ASCVD risk score: The ASCVD Risk score (Arnett DK, et al., 2019) failed to calculate for the following reasons:   The valid total cholesterol range is 130 to 320 mg/dL    Dispo: 6 mos or sooner as needed  Total time of encounter: I spent 42 minutes dedicated to the care of this patient on the date of this encounter to include pre-visit review of records, face-to-face time with the patient discussing conditions above, and clinical documentation with the electronic health record. We specifically spent time today discussing monitor results, types of arrhythmias, normal heart rate variability, management options, signs/symptoms to watch for, prior testing results, other potential etiologies of her symptoms.   Signed, Shelda Bruckner, MD   Shelda Bruckner, MD, PhD, [facility]  [facility]    Heart & Vascular at [facility] at [facility] [address], [address] [place], [place] [address] [phone]   "

## 2024-06-01 NOTE — Patient Instructions (Signed)
 Medication Instructions:  No changes *If you need a refill on your cardiac medications before your next appointment, please call your pharmacy*  Lab Work: none  Testing/Procedures: none  Follow-Up: At [facility], you and your health needs are our priority.  As part of our continuing mission to provide you with exceptional heart care, our providers are all part of one team.  This team includes your primary Cardiologist (physician) and Advanced Practice Providers or APPs (Physician Assistants and Nurse Practitioners) who all work together to provide you with the care you need, when you need it.  Your next appointment:   6 month(s)  Provider:   Shelda Bruckner, MD, Rosaline Bane, NP, or Reche Finder, NP    We recommend signing up for the patient portal called MyChart.  Sign up information is provided on this After Visit Summary.  MyChart is used to connect with patients for Virtual Visits (Telemedicine).  Patients are able to view lab/test results, encounter notes, upcoming appointments, etc.  Non-urgent messages can be sent to your provider as well.   To learn more about what you can do with MyChart, go to [email or web address].   Other Instructions
